# Patient Record
Sex: Female | Born: 1970
Health system: Southern US, Community
[De-identification: ages and names within clinical notes are randomized; demographics above are authoritative.]

## PROBLEM LIST (undated history)

## (undated) DIAGNOSIS — IMO0001 Reserved for inherently not codable concepts without codable children: Secondary | ICD-10-CM

## (undated) DIAGNOSIS — F329 Major depressive disorder, single episode, unspecified: Secondary | ICD-10-CM

## (undated) DIAGNOSIS — I2699 Other pulmonary embolism without acute cor pulmonale: Secondary | ICD-10-CM

## (undated) DIAGNOSIS — K219 Gastro-esophageal reflux disease without esophagitis: Secondary | ICD-10-CM

## (undated) DIAGNOSIS — N938 Other specified abnormal uterine and vaginal bleeding: Secondary | ICD-10-CM

## (undated) DIAGNOSIS — F32A Depression, unspecified: Secondary | ICD-10-CM

## (undated) DIAGNOSIS — Z86711 Personal history of pulmonary embolism: Secondary | ICD-10-CM

## (undated) DIAGNOSIS — K118 Other diseases of salivary glands: Secondary | ICD-10-CM

## (undated) DIAGNOSIS — I5189 Other ill-defined heart diseases: Secondary | ICD-10-CM

## (undated) DIAGNOSIS — I82629 Acute embolism and thrombosis of deep veins of unspecified upper extremity: Secondary | ICD-10-CM

## (undated) DIAGNOSIS — F419 Anxiety disorder, unspecified: Secondary | ICD-10-CM

## (undated) DIAGNOSIS — D509 Iron deficiency anemia, unspecified: Secondary | ICD-10-CM

## (undated) HISTORY — PX: PAROTID GLAND TUMOR EXCISION: SHX5221

## (undated) HISTORY — DX: Personal history of pulmonary embolism: Z86.711

## (undated) HISTORY — DX: Depression, unspecified: F32.A

## (undated) HISTORY — DX: Gastro-esophageal reflux disease without esophagitis: K21.9

## (undated) HISTORY — PX: ABDOMINAL HYSTERECTOMY: SHX81

## (undated) HISTORY — DX: Major depressive disorder, single episode, unspecified: F32.9

## (undated) HISTORY — PX: WISDOM TOOTH EXTRACTION: SHX21

---

## 2003-02-28 ENCOUNTER — Ambulatory Visit (HOSPITAL_COMMUNITY): Admission: RE | Admit: 2003-02-28 | Discharge: 2003-02-28 | Payer: Self-pay | Admitting: Urology

## 2003-03-07 ENCOUNTER — Ambulatory Visit (HOSPITAL_COMMUNITY): Admission: RE | Admit: 2003-03-07 | Discharge: 2003-03-07 | Payer: Self-pay | Admitting: Urology

## 2006-11-18 ENCOUNTER — Ambulatory Visit (HOSPITAL_COMMUNITY): Admission: RE | Admit: 2006-11-18 | Discharge: 2006-11-18 | Payer: Self-pay | Admitting: Family Medicine

## 2007-04-03 ENCOUNTER — Ambulatory Visit (HOSPITAL_COMMUNITY): Admission: RE | Admit: 2007-04-03 | Discharge: 2007-04-03 | Payer: Self-pay | Admitting: Family Medicine

## 2007-11-26 ENCOUNTER — Ambulatory Visit (HOSPITAL_COMMUNITY): Admission: RE | Admit: 2007-11-26 | Discharge: 2007-11-26 | Payer: Self-pay | Admitting: Cardiovascular Disease

## 2008-04-27 ENCOUNTER — Ambulatory Visit (HOSPITAL_COMMUNITY): Admission: RE | Admit: 2008-04-27 | Discharge: 2008-04-27 | Payer: Self-pay | Admitting: Family Medicine

## 2013-01-10 ENCOUNTER — Encounter (HOSPITAL_COMMUNITY): Payer: Self-pay | Admitting: Emergency Medicine

## 2013-01-10 DIAGNOSIS — Y929 Unspecified place or not applicable: Secondary | ICD-10-CM | POA: Insufficient documentation

## 2013-01-10 DIAGNOSIS — X503XXA Overexertion from repetitive movements, initial encounter: Secondary | ICD-10-CM | POA: Insufficient documentation

## 2013-01-10 DIAGNOSIS — Y9389 Activity, other specified: Secondary | ICD-10-CM | POA: Insufficient documentation

## 2013-01-10 DIAGNOSIS — S0993XA Unspecified injury of face, initial encounter: Secondary | ICD-10-CM | POA: Insufficient documentation

## 2013-01-10 DIAGNOSIS — R209 Unspecified disturbances of skin sensation: Secondary | ICD-10-CM | POA: Insufficient documentation

## 2013-01-10 DIAGNOSIS — X500XXA Overexertion from strenuous movement or load, initial encounter: Secondary | ICD-10-CM | POA: Insufficient documentation

## 2013-01-10 DIAGNOSIS — K029 Dental caries, unspecified: Secondary | ICD-10-CM | POA: Insufficient documentation

## 2013-01-10 DIAGNOSIS — S199XXA Unspecified injury of neck, initial encounter: Principal | ICD-10-CM

## 2013-01-10 DIAGNOSIS — K089 Disorder of teeth and supporting structures, unspecified: Secondary | ICD-10-CM | POA: Insufficient documentation

## 2013-01-10 NOTE — ED Notes (Signed)
Pain in neck for 2 weeks. Numbness of lt side of face since 930pm.  Both arms numb when lies down.  Normal movement of ext.

## 2013-01-11 ENCOUNTER — Emergency Department (HOSPITAL_COMMUNITY)
Admission: EM | Admit: 2013-01-11 | Discharge: 2013-01-11 | Disposition: A | Discharge status: Home / Self Care | Payer: Medicaid Other | Attending: Emergency Medicine | Admitting: Emergency Medicine

## 2013-01-11 DIAGNOSIS — M542 Cervicalgia: Secondary | ICD-10-CM

## 2013-01-11 DIAGNOSIS — K089 Disorder of teeth and supporting structures, unspecified: Secondary | ICD-10-CM

## 2013-01-11 MED ORDER — NAPROXEN 500 MG PO TABS: 500.0000 mg | ORAL_TABLET | Freq: Two times a day (BID) | ORAL | Status: DC

## 2013-01-11 MED ORDER — PREDNISONE 20 MG PO TABS: ORAL_TABLET | ORAL | Status: DC

## 2013-01-11 MED ORDER — AMOXICILLIN 500 MG PO CAPS: 500.0000 mg | ORAL_CAPSULE | Freq: Three times a day (TID) | ORAL | Status: DC

## 2013-01-11 MED ORDER — PREDNISONE 20 MG PO TABS
40.0000 mg | ORAL_TABLET | Freq: Once | ORAL | Status: AC
  Administered 2013-01-11: 40 mg via ORAL
  Filled 2013-01-11: qty 2

## 2013-01-11 MED ORDER — CLINDAMYCIN HCL 300 MG PO CAPS: 300.0000 mg | ORAL_CAPSULE | Freq: Four times a day (QID) | ORAL | Status: DC

## 2013-01-11 MED ORDER — AMOXICILLIN 250 MG PO CAPS
1000.0000 mg | ORAL_CAPSULE | Freq: Once | ORAL | Status: DC
  Filled 2013-01-11: qty 4

## 2013-01-11 MED ORDER — CYCLOBENZAPRINE HCL 10 MG PO TABS: 10.0000 mg | ORAL_TABLET | Freq: Two times a day (BID) | ORAL | Status: DC | PRN

## 2013-01-11 MED ORDER — KETOROLAC TROMETHAMINE 60 MG/2ML IM SOLN
60.0000 mg | Freq: Once | INTRAMUSCULAR | Status: AC
  Administered 2013-01-11: 60 mg via INTRAMUSCULAR
  Filled 2013-01-11: qty 2

## 2013-01-11 NOTE — ED Provider Notes (Signed)
CSN: [id number]     Arrival date & time 01/10/13  2248 History   First MD Initiated Contact with Patient 01/11/13 0102     Chief Complaint  Patient presents with  . Neck Pain   (Consider location/radiation/quality/duration/timing/severity/associated sxs/prior Treatment) HPI.... neck pain for 2 weeks secondary to lifting baby awkwardly.   Patient claims to have transient arm numbness in the morning. Additionally she has a rotten tooth #12 with associated left cheek numbness.  No fever, chills, meningeal signs. Certain positioning makes neck pain worse. Severity is mild to moderate. No chronic health problems.  History reviewed. No pertinent past medical history. History reviewed. No pertinent past surgical history. History reviewed. No pertinent family history. History  Substance Use Topics  . Smoking status: Never Smoker   . Smokeless tobacco: Not on file  . Alcohol Use: No   OB History   Grav Para Term Preterm Abortions TAB SAB Ect Mult Living                 Review of Systems  All other systems reviewed and are negative.    Allergies  Review of patient's allergies indicates no known allergies.  Home Medications   Current Outpatient Rx  Name  Route  Sig  Dispense  Refill  . amoxicillin (AMOXIL) 500 MG capsule   Oral   Take 1 capsule (500 mg total) by mouth 3 (three) times daily.   30 capsule   0   . cyclobenzaprine (FLEXERIL) 10 MG tablet   Oral   Take 1 tablet (10 mg total) by mouth 2 (two) times daily as needed for muscle spasms.   20 tablet   0   . naproxen (NAPROSYN) 500 MG tablet   Oral   Take 1 tablet (500 mg total) by mouth 2 (two) times daily.   20 tablet   0   . predniSONE (DELTASONE) 20 MG tablet      3 tabs po day one, then 2 po daily x 4 days   11 tablet   0    BP 123/57  Pulse 96  Temp(Src) 99 F (37.2 C) (Oral)  Resp 20  Ht 5\' 5"  (1.651 m)  Wt 135 lb (61.236 kg)  BMI 22.47 kg/m2  SpO2 100%  LMP 01/10/2013 Physical Exam  Nursing  note and vitals reviewed. Constitutional: She is oriented to person, place, and time. She appears well-developed and well-nourished.  HENT:  Head: Normocephalic and atraumatic.  Tooth #12 is rotten  Eyes: Conjunctivae and EOM are normal. Pupils are equal, round, and reactive to light.  Neck: Normal range of motion. Neck supple.  Minimal generalized cervical tenderness, but no meningeal signs  Cardiovascular: Normal rate, regular rhythm and normal heart sounds.   Pulmonary/Chest: Effort normal and breath sounds normal.  Abdominal: Soft. Bowel sounds are normal.  Musculoskeletal: Normal range of motion.  Neurological: She is alert and oriented to person, place, and time.  Skin: Skin is warm and dry.  Psychiatric: She has a normal mood and affect. Her behavior is normal.    ED Course  Procedures (including critical care time) Labs Review Labs Reviewed - No data to display Imaging Review No results found.  EKG Interpretation   None       MDM   1. Neck pain   2. Tooth disease    Patient is in no acute distress. No evidence of stroke or MI. Rx amoxicillin 500 mg, prednisone, Naprosyn 500 mg, Flexeril 10 mg. Resource guide given.  Nat Christen, MD 01/11/13 [phone]

## 2013-01-11 NOTE — Discharge Instructions (Signed)
Prescriptions for antibiotic for tooth. Also prescriptions for medication for pain, inflammation, muscle spasm. Resource guide given for healthcare followup.  You will ultimately need to pull the tooth.    Emergency Department Resource Guide 1) Find a Doctor and Pay Out of Pocket Although you won't have to find out who is covered by your insurance plan, it is a good idea to ask around and get recommendations. You will then need to call the office and see if the doctor you have chosen will accept you as a new patient and what types of options they offer for patients who are self-pay. Some doctors offer discounts or will set up payment plans for their patients who do not have insurance, but you will need to ask so you aren't surprised when you get to your appointment.  2) Contact Your Local Health Department Not all health departments have doctors that can see patients for sick visits, but many do, so it is worth a call to see if yours does. If you don't know where your local health department is, you can check in your phone book. The CDC also has a tool to help you locate your state's health department, and many state websites also have listings of all of their local health departments.  3) Find a [facility] If your illness is not likely to be very severe or complicated, you may want to try a walk in clinic. These are popping up all over the country in pharmacies, drugstores, and shopping centers. They're usually staffed by nurse practitioners or physician assistants that have been trained to treat common illnesses and complaints. They're usually fairly quick and inexpensive. However, if you have serious medical issues or chronic medical problems, these are probably not your best option.  No Primary Care Doctor: - Call Health Connect at  [phone] - they can help you locate a primary care doctor that  accepts your insurance, provides certain services, etc. - Physician Referral Service-  [phone]  Chronic Pain Problems: Organization         Address  Phone   Notes  [facility]  [phone] Patients need to be referred by their primary care doctor.   Medication Assistance: Organization         Address  Phone   Notes  [facility] Medication [facility] [address] [place], [place] [address] [phone] --Must be a resident of [facility] -- Must have NO insurance coverage whatsoever (no Medicaid/ Medicare, etc.) -- The pt. MUST have a primary care doctor that directs their care regularly and follows them in the community   MedAssist  [phone]   [employer]  [phone]    Agencies that provide inexpensive medical care: Organization         Address  Phone   Notes  [place]  [phone]   [facility]    [phone]   [facility] [place] [place], [place] [address] [phone]   [place] [address] [place] [phone]   Planned Parenthood    [phone]   [facility]    [phone]   [place] and [place] [address], [place] Phone:  [phone], Fax:  [phone] Hours of Operation:  9 am - 6 pm, M-F.  Also accepts Medicaid/Medicare and self-pay.  [facility] for [place]  [address], [address], [place] Phone: [phone], Fax: [phone]. Hours of Operation:  8:30 am - 5:30 pm, M-F.  Also accepts Medicaid and self-pay.  [facility] [address] [place] Phone: [phone]   [place], [place], [place] [phone], Ext. [phone] Mondays & Thursdays: 7-9 AM.  First 15 patients are seen on a first come, first serve basis.    [place] Providers:  Organization         Address  Phone   Notes  [facility] [address],  [place] [phone] Also accepts self-pay patients.  [facility] [address], [place]  [phone]   [place], [address], [place] [phone]   [facility] [address] [place] [phone]   Lucianne Lei [address], [place]   [phone] Only accepts [place] Access [place] patients after they have their name applied to their card.   Self-Pay (no insurance) in [facility]:  Organization         Address  Phone   Notes  Sickle Cell Patients, [facility] [place] [phone]   [facility] Urgent Care [place] [phone]   [facility] [place]  [place], [place], [place] [phone]   [facility]/Dr. Osei-Bonsu  [address] [place] or [address], [place] [phone] Phone number for both [place] and [place] locations is the same.  Urgent Medical and [facility] [address], [place] [phone]   [facility] [address] [place] or [address] [phone]   [facility] [address] [place] [phone], phone; [phone], fax Sees patients 1st and 3rd Saturday of every month.  Must not qualify for public or private insurance (i.e. Medicaid, Medicare, [place] Health Choice, Veterans' Benefits)  Household income should be no more than 200% of the poverty level The clinic cannot treat you if you are pregnant or think you are pregnant  Sexually transmitted diseases are not treated at the clinic.    Dental Care: Organization         Address  Phone  Notes  [facility] Department of [facility] [place] [phone] Accepts children up to age 11 who are enrolled in  [place] or [place]; pregnant women with a Medicaid card; and children who have applied for Medicaid or [place] Health Choice, but were declined, whose parents can pay a reduced fee at time of service.  [facility] Department of [facility]  [address], [place] [phone] Accepts children up to age 74 who are enrolled in [place] or [place]; pregnant women with a Medicaid card; and children who have applied for Medicaid or [place] Health Choice, but were declined, whose parents can pay a reduced fee at time of service.  [place] Adult [facility] Access PROGRAM  [place] [phone] Patients are seen by appointment only. Walk-ins are not accepted. [place] will see patients 38 years of age and older. Monday - Tuesday (8am-5pm) Most Wednesdays (8:30-5pm) $30 per visit, cash only  [place]  PROGRAM  [address], [facility] [phone] Patients are seen by appointment only. Walk-ins are not accepted. [place] will see patients 72 years of age and older. One Wednesday Evening (Monthly: Volunteer Based).  $30 per visit, cash only  [place]  [phone] for adults; Children under age 63, call Graduate Pediatric Dentistry at [phone]. Children aged 75-14, please call [phone] to request a pediatric application.  Dental services are provided in all areas of dental care including fillings, crowns and bridges, complete and partial dentures, implants, gum treatment, root canals, and extractions. Preventive care is also provided. Treatment is provided to both adults and children. Patients are selected via a lottery and there is often a waiting list.   [facility] [address] [place]  [phone] [email or web address]   [facility] [address], [place] [phone], Ext. [phone] Second and Fourth Thursday of each month, opens at 6:30  AM; Clinic ends at 9 AM.  Patients are seen on a first-come first-served basis, and a limited number are seen during each clinic.   [facility]  [address] Hillard Danker [place], [place] [phone]   Eligibility Requirements You must have lived in [place], [place], or [place] counties for at least the last three months.   You cannot be eligible for state or federal sponsored [employer], including [employer], [place], or [employer].   You generally cannot be eligible for healthcare insurance through your employer.    How to apply: Eligibility screenings are held every Tuesday and Wednesday afternoon from 1:00 pm until 4:00 pm. You do not need an appointment for the interview!  [facility] [address] [place], [place]   [place]  [facility] Department  [place]  [phone]    Behavioral Health Resources in the Community: Intensive Outpatient Programs Organization         Address  Phone  Notes  [place] Warren. [address], [place], [place] [phone]   [facility] Outpatient [address], [place], [place]   ADS: Alcohol & Drug Svcs [address] [place], [place]   [place] [address]  [place], [place] or [phone]   Substance Abuse Resources Organization         Address  Phone  Notes  Alcohol and Drug Services  [phone]   [place]  [phone]   The [place]   Chinita Pester  [phone]   Residential & Outpatient Substance Abuse Program  [phone]   Psychological Services Organization         Address  Phone  Notes  [facility] [place]  [place]  [phone]   [place] [address], [place] or  [phone]    Mobile Crisis Teams Organization         Address  Phone  Notes  [facility], Mobile Crisis Care Unit  [phone]   [facility]otherapeutic Services  [address] [place], [place]   [place] [address], [place] [phone]    Self-Help/Support Groups Organization         Address  Phone             Notes  [place].  of [place] - variety of support groups  [place] Call for more information  Narcotics Anonymous (NA), [facility] [address], [employer] [place]  2 meetings at this location   Special educational needs teacher         Address  Phone  Notes  ASAP Residential Treatment [place],    [place]  [phone]   [facility]  [address] [place] [address], [place], [place]   [place], [place] [phone] Admissions: 8am-3pm M-F  Incentives Substance [place] [address],    [place], [place] [phone]   [facility] [address] [place], [place], [place]   [facility] [address],  [place], [place]   [facility] Programs - Intensive Outpatient [address]een [address], [place], [place]   [facility] [place].,  [place], [place] [phone] or [phone]   [facility] Services (RTS) [address] [place], [place] Accepts Medicaid  [facility] [place] [address]  [place] [phone] Substance Abuse/Addiction Treatment   [facility] Organization         Address  Phone  Notes  [facility]  [phone]   Domenic Schwab, PhD [address] Arlis Porta [place], [place]   [phone] or [phone]   [place], [place] [phone]   [facility] [address],  [place], [place] [phone] Insurance/Medicaid/sponsorship through [facility] and Families [address]                                    [place], [place] [phone] [place] [address], [place] [phone]    Dr. Adele Schilder  [phone]   [facility] Dept. 1) [address], [place] 2) [place] 3)  [place] [address], [place] [phone]  [phone]   [place] [phone] or [phone] (After Hours)

## 2013-01-20 ENCOUNTER — Encounter (HOSPITAL_COMMUNITY): Payer: Self-pay | Admitting: Emergency Medicine

## 2013-01-20 ENCOUNTER — Emergency Department (HOSPITAL_COMMUNITY)
Admission: EM | Admit: 2013-01-20 | Discharge: 2013-01-21 | Disposition: A | Discharge status: Home / Self Care | Payer: Medicaid Other | Attending: Emergency Medicine | Admitting: Emergency Medicine

## 2013-01-20 ENCOUNTER — Emergency Department (HOSPITAL_COMMUNITY): Payer: Medicaid Other

## 2013-01-20 DIAGNOSIS — R5383 Other fatigue: Secondary | ICD-10-CM

## 2013-01-20 DIAGNOSIS — R5381 Other malaise: Secondary | ICD-10-CM | POA: Insufficient documentation

## 2013-01-20 DIAGNOSIS — IMO0002 Reserved for concepts with insufficient information to code with codable children: Secondary | ICD-10-CM | POA: Insufficient documentation

## 2013-01-20 DIAGNOSIS — R209 Unspecified disturbances of skin sensation: Secondary | ICD-10-CM | POA: Insufficient documentation

## 2013-01-20 DIAGNOSIS — R220 Localized swelling, mass and lump, head: Secondary | ICD-10-CM | POA: Insufficient documentation

## 2013-01-20 DIAGNOSIS — R221 Localized swelling, mass and lump, neck: Secondary | ICD-10-CM

## 2013-01-20 DIAGNOSIS — Z791 Long term (current) use of non-steroidal anti-inflammatories (NSAID): Secondary | ICD-10-CM | POA: Insufficient documentation

## 2013-01-20 DIAGNOSIS — J329 Chronic sinusitis, unspecified: Secondary | ICD-10-CM

## 2013-01-20 DIAGNOSIS — M542 Cervicalgia: Secondary | ICD-10-CM | POA: Insufficient documentation

## 2013-01-20 DIAGNOSIS — D649 Anemia, unspecified: Secondary | ICD-10-CM | POA: Insufficient documentation

## 2013-01-20 DIAGNOSIS — Z880 Allergy status to penicillin: Secondary | ICD-10-CM | POA: Insufficient documentation

## 2013-01-20 DIAGNOSIS — R197 Diarrhea, unspecified: Secondary | ICD-10-CM | POA: Insufficient documentation

## 2013-01-20 DIAGNOSIS — F411 Generalized anxiety disorder: Secondary | ICD-10-CM | POA: Insufficient documentation

## 2013-01-20 LAB — CBC WITH DIFFERENTIAL/PLATELET
BASOS ABS: 0 10*3/uL (ref 0.0–0.1)
Basophils Relative: 0 % (ref 0–1)
Eosinophils Absolute: 0.1 10*3/uL (ref 0.0–0.7)
Eosinophils Relative: 1 % (ref 0–5)
HCT: 30.9 % — ABNORMAL LOW (ref 36.0–46.0)
Hemoglobin: 9.8 g/dL — ABNORMAL LOW (ref 12.0–15.0)
LYMPHS ABS: 2.2 10*3/uL (ref 0.7–4.0)
LYMPHS PCT: 23 % (ref 12–46)
MCH: 24.2 pg — ABNORMAL LOW (ref 26.0–34.0)
MCHC: 31.7 g/dL (ref 30.0–36.0)
MCV: 76.3 fL — ABNORMAL LOW (ref 78.0–100.0)
Monocytes Absolute: 0.8 10*3/uL (ref 0.1–1.0)
Monocytes Relative: 9 % (ref 3–12)
NEUTROS ABS: 6.4 10*3/uL (ref 1.7–7.7)
NEUTROS PCT: 67 % (ref 43–77)
PLATELETS: 216 10*3/uL (ref 150–400)
RBC: 4.05 MIL/uL (ref 3.87–5.11)
RDW: 18 % — AB (ref 11.5–15.5)
WBC: 9.5 10*3/uL (ref 4.0–10.5)

## 2013-01-20 LAB — BASIC METABOLIC PANEL
BUN: 3 mg/dL — ABNORMAL LOW (ref 6–23)
CO2: 25 meq/L (ref 19–32)
Calcium: 8.8 mg/dL (ref 8.4–10.5)
Chloride: 105 mEq/L (ref 96–112)
Creatinine, Ser: 0.72 mg/dL (ref 0.50–1.10)
GFR calc Af Amer: >90 mL/min (ref >90)
GFR calc non Af Amer: >90 mL/min (ref >90)
GLUCOSE: 108 mg/dL — AB (ref 70–99)
POTASSIUM: 4.5 meq/L (ref 3.7–5.3)
SODIUM: 142 meq/L (ref 137–147)

## 2013-01-20 MED ORDER — IOHEXOL 300 MG/ML  SOLN
75.0000 mL | Freq: Once | INTRAMUSCULAR | Status: AC | PRN
  Administered 2013-01-20: 75 mL via INTRAVENOUS

## 2013-01-20 MED ORDER — FERROUS SULFATE 325 (65 FE) MG PO TABS: 325.0000 mg | ORAL_TABLET | Freq: Every day | ORAL | Status: DC

## 2013-01-20 MED ORDER — SODIUM CHLORIDE 0.9 % IV SOLN
Freq: Once | INTRAVENOUS | Status: AC
  Administered 2013-01-20: 22:00:00 via INTRAVENOUS

## 2013-01-20 MED ORDER — PREDNISONE 50 MG PO TABS: ORAL_TABLET | ORAL | Status: DC

## 2013-01-20 MED ORDER — SODIUM CHLORIDE 0.9 % IV SOLN
Freq: Once | INTRAVENOUS | Status: AC
  Administered 2013-01-20: 23:00:00 via INTRAVENOUS

## 2013-01-20 MED ORDER — SODIUM CHLORIDE 0.9 % IV SOLN: Freq: Once | INTRAVENOUS | Status: DC

## 2013-01-20 MED ORDER — TRAMADOL HCL 50 MG PO TABS: 50.0000 mg | ORAL_TABLET | Freq: Four times a day (QID) | ORAL | Status: DC | PRN

## 2013-01-20 NOTE — Discharge Instructions (Signed)
Iron Deficiency Anemia, Adult Anemia is when you have a low number of healthy red blood cells. It is often caused by too little iron. This is called iron deficiency anemia. It may make you tired and short of breath. HOME CARE   Take iron as told by your doctor.  Take vitamins as told by your doctor.  Eat foods that have iron in them. This includes liver, lean beef, whole-grain bread, eggs, dried fruit, and dark green leafy vegetables. GET HELP RIGHT AWAY IF:  You pass out (faint).  You have chest pain.  You feel sick to your stomach (nauseous) or throw up (vomit).  You get very short of breath with activity.  You are weak.  You have a fast heartbeat.  You start to sweat for no reason.  You become lightheaded when getting up from a chair or bed. MAKE SURE YOU:  Understand these instructions.  Will watch your condition.  Will get help right away if you are not doing well or get worse. Document Released: 01/25/2010 Document Revised: 10/13/2012 Document Reviewed: 08/30/2012 [facility] Patient Information 2014 [place].

## 2013-01-20 NOTE — ED Notes (Signed)
Pt says lt side of face is numb and feels swollen.  Seen here  For same.  Had  2 upper lt teeth removed 1 week ago and 2 mandibular wisdom teeth removed on Tuesday  1/13.   Pt is having diarrhea , she thinks from the  Clindamycin and has stopped that.  Pt says she feels weak , tearful. Concerned that she may have a brain tumor causing her numbness.

## 2013-01-20 NOTE — ED Notes (Signed)
Facial pain and had teeth pulled. Feeling weak, was put on an antibiotic and was told to stop taking it since I was having watery diarrhea per pt.

## 2013-01-21 ENCOUNTER — Emergency Department (HOSPITAL_COMMUNITY)
Admission: EM | Admit: 2013-01-21 | Discharge: 2013-01-21 | Disposition: A | Discharge status: Home / Self Care | Payer: Medicaid Other | Attending: Emergency Medicine | Admitting: Emergency Medicine

## 2013-01-21 ENCOUNTER — Encounter (HOSPITAL_COMMUNITY): Payer: Self-pay | Admitting: Emergency Medicine

## 2013-01-21 ENCOUNTER — Emergency Department (HOSPITAL_COMMUNITY): Payer: Medicaid Other

## 2013-01-21 DIAGNOSIS — IMO0002 Reserved for concepts with insufficient information to code with codable children: Secondary | ICD-10-CM | POA: Insufficient documentation

## 2013-01-21 DIAGNOSIS — R5383 Other fatigue: Secondary | ICD-10-CM

## 2013-01-21 DIAGNOSIS — R42 Dizziness and giddiness: Secondary | ICD-10-CM | POA: Insufficient documentation

## 2013-01-21 DIAGNOSIS — Z791 Long term (current) use of non-steroidal anti-inflammatories (NSAID): Secondary | ICD-10-CM | POA: Insufficient documentation

## 2013-01-21 DIAGNOSIS — R55 Syncope and collapse: Secondary | ICD-10-CM

## 2013-01-21 DIAGNOSIS — R209 Unspecified disturbances of skin sensation: Secondary | ICD-10-CM | POA: Insufficient documentation

## 2013-01-21 DIAGNOSIS — Z3202 Encounter for pregnancy test, result negative: Secondary | ICD-10-CM | POA: Insufficient documentation

## 2013-01-21 DIAGNOSIS — Z79899 Other long term (current) drug therapy: Secondary | ICD-10-CM | POA: Insufficient documentation

## 2013-01-21 DIAGNOSIS — Z880 Allergy status to penicillin: Secondary | ICD-10-CM | POA: Insufficient documentation

## 2013-01-21 DIAGNOSIS — J3489 Other specified disorders of nose and nasal sinuses: Secondary | ICD-10-CM | POA: Insufficient documentation

## 2013-01-21 DIAGNOSIS — R5381 Other malaise: Secondary | ICD-10-CM | POA: Insufficient documentation

## 2013-01-21 HISTORY — DX: Reserved for inherently not codable concepts without codable children: IMO0001

## 2013-01-21 LAB — URINALYSIS, ROUTINE W REFLEX MICROSCOPIC
BILIRUBIN URINE: NEGATIVE
Glucose, UA: NEGATIVE mg/dL
KETONES UR: NEGATIVE mg/dL
LEUKOCYTES UA: NEGATIVE
NITRITE: NEGATIVE
PH: 6.5 (ref 5.0–8.0)
PROTEIN: NEGATIVE mg/dL
Specific Gravity, Urine: <1.005 — ABNORMAL LOW (ref 1.005–1.030)
UROBILINOGEN UA: 0.2 mg/dL (ref 0.0–1.0)

## 2013-01-21 LAB — COMPREHENSIVE METABOLIC PANEL
ALK PHOS: 69 U/L (ref 39–117)
ALT: 9 U/L (ref 0–35)
AST: 15 U/L (ref 0–37)
Albumin: 3.9 g/dL (ref 3.5–5.2)
BILIRUBIN TOTAL: 0.2 mg/dL — AB (ref 0.3–1.2)
BUN: 3 mg/dL — AB (ref 6–23)
CO2: 25 meq/L (ref 19–32)
Calcium: 9.5 mg/dL (ref 8.4–10.5)
Chloride: 102 mEq/L (ref 96–112)
Creatinine, Ser: 0.72 mg/dL (ref 0.50–1.10)
GLUCOSE: 90 mg/dL (ref 70–99)
POTASSIUM: 4.2 meq/L (ref 3.7–5.3)
SODIUM: 142 meq/L (ref 137–147)
TOTAL PROTEIN: 7.6 g/dL (ref 6.0–8.3)

## 2013-01-21 LAB — CBC WITH DIFFERENTIAL/PLATELET
Basophils Absolute: 0 10*3/uL (ref 0.0–0.1)
Basophils Relative: 0 % (ref 0–1)
EOS ABS: 0.1 10*3/uL (ref 0.0–0.7)
Eosinophils Relative: 1 % (ref 0–5)
HCT: 34.5 % — ABNORMAL LOW (ref 36.0–46.0)
HEMOGLOBIN: 11 g/dL — AB (ref 12.0–15.0)
LYMPHS ABS: 2.7 10*3/uL (ref 0.7–4.0)
LYMPHS PCT: 28 % (ref 12–46)
MCH: 24.1 pg — AB (ref 26.0–34.0)
MCHC: 31.9 g/dL (ref 30.0–36.0)
MCV: 75.7 fL — AB (ref 78.0–100.0)
MONOS PCT: 8 % (ref 3–12)
Monocytes Absolute: 0.8 10*3/uL (ref 0.1–1.0)
NEUTROS PCT: 63 % (ref 43–77)
Neutro Abs: 6.1 10*3/uL (ref 1.7–7.7)
PLATELETS: 246 10*3/uL (ref 150–400)
RBC: 4.56 MIL/uL (ref 3.87–5.11)
RDW: 18.1 % — ABNORMAL HIGH (ref 11.5–15.5)
WBC: 9.7 10*3/uL (ref 4.0–10.5)

## 2013-01-21 LAB — URINE MICROSCOPIC-ADD ON

## 2013-01-21 LAB — PREGNANCY, URINE: Preg Test, Ur: NEGATIVE

## 2013-01-21 MED ORDER — SODIUM CHLORIDE 0.9 % IV SOLN
1000.0000 mL | Freq: Once | INTRAVENOUS | Status: AC
  Administered 2013-01-21: 1000 mL via INTRAVENOUS

## 2013-01-21 NOTE — ED Notes (Signed)
Pt alert & oriented x4, stable gait. Patient given discharge instructions, paperwork & prescription(s). Patient  instructed to stop at the registration desk to finish any additional paperwork. Patient verbalized understanding. Pt left department w/ no further questions. 

## 2013-01-21 NOTE — ED Provider Notes (Signed)
CSN: [id number]     Arrival date & time 01/21/13  1721 History   First MD Initiated Contact with Patient 01/21/13 1746     Chief Complaint  Patient presents with  . Near Syncope    HPI Pt was seen at 1750. Per pt, c/o gradual onset and persistence of constant generalized fatigue and lightheadedness for the past 1.5 weeks. Pt states her symptoms worsen when she stands up from laying down. Pt states she has been evaluated in the ED x2, as well as by her Dentist and Oral Surgeon x2 in the past 1.5 weeks for same. States her left face continues to "feels numb" and "hurts" since her teeth removal 1 week ago. Denies any other symptoms, no CP/palpitations, no SOB/cough, no abd pain, no N/V/D, no rash, no fevers, no sore throat.    Past Medical History  Diagnosis Date  . Normal cardiac stress test 2009    normal coronary CT scan   Past Surgical History  Procedure Laterality Date  . Wisdom tooth extraction      History  Substance Use Topics  . Smoking status: Never Smoker   . Smokeless tobacco: Not on file  . Alcohol Use: No    Review of Systems ROS: Statement: All systems negative except as marked or noted in the HPI; Constitutional: +generalized fatigue, lightheadedness. Negative for fever and chills. ; ; Eyes: Negative for eye pain, redness and discharge. ; ; ENMT: Negative for ear pain, hoarseness, nasal congestion, sinus pressure and sore throat. ; ; Cardiovascular: Negative for chest pain, palpitations, diaphoresis, dyspnea and peripheral edema. ; ; Respiratory: Negative for cough, wheezing and stridor. ; ; Gastrointestinal: Negative for nausea, vomiting, diarrhea, abdominal pain, blood in stool, hematemesis, jaundice and rectal bleeding. . ; ; Genitourinary: Negative for dysuria, flank pain and hematuria. ; ; Musculoskeletal: Negative for back pain and neck pain. Negative for swelling and trauma.; ; Skin: Negative for pruritus, rash, abrasions, blisters, bruising and skin lesion.; ; Neuro:  Negative for headache and neck stiffness. Negative for altered level of consciousness , altered mental status, extremity weakness, paresthesias, involuntary movement, seizure and syncope.     Allergies  Amoxicillin  Home Medications   Current Outpatient Rx  Name  Route  Sig  Dispense  Refill  . cyclobenzaprine (FLEXERIL) 10 MG tablet   Oral   Take 1 tablet (10 mg total) by mouth 2 (two) times daily as needed for muscle spasms.   20 tablet   0   . ferrous sulfate 325 (65 FE) MG tablet   Oral   Take 1 tablet (325 mg total) by mouth daily.   30 tablet   0   . naproxen (NAPROSYN) 500 MG tablet   Oral   Take 1 tablet (500 mg total) by mouth 2 (two) times daily.   20 tablet   0   . traMADol (ULTRAM) 50 MG tablet   Oral   Take 1 tablet (50 mg total) by mouth every 6 (six) hours as needed.   10 tablet   0   . predniSONE (DELTASONE) 50 MG tablet      One tablet po qd x 3 days   3 tablet   0    BP 117/78  Pulse 94  Temp(Src) 98.7 F (37.1 C) (Oral)  Resp 18  Wt 135 lb (61.236 kg)  SpO2 100%  LMP 01/10/2013 Physical Exam 1755: Physical examination:  Nursing notes reviewed; Vital signs and O2 SAT reviewed;  Constitutional: Well developed, Well  nourished, Well hydrated, In no acute distress; Head:  Normocephalic, atraumatic. No facial edema, erythema, ecchymosis..; Eyes: EOMI, PERRL, No scleral icterus; ENMT: TM's clear bilat. +edemetous nasal turbinates bilat with clear rhinorrhea. Healing teeth extraction sites without bleeding, drainage. Mouth and pharynx without lesions. No tonsillar exudates. No intra-oral edema. No submandibular or sublingual edema. No hoarse voice, no drooling, no stridor. No pain with manipulation of larynx.  Mouth and pharynx normal, Mucous membranes moist; Neck: Supple, Full range of motion, No lymphadenopathy. No meningeal signs.; Cardiovascular: Regular rate and rhythm, No murmur, rub, or gallop; Respiratory: Breath sounds clear & equal bilaterally,  No rales, rhonchi, wheezes.  Speaking full sentences with ease, Normal respiratory effort/excursion; Chest: Nontender, Movement normal; Abdomen: Soft, Nontender, Nondistended, Normal bowel sounds; Genitourinary: No CVA tenderness; Spine:  No midline CS, TS, LS tenderness. +TTP left hypertonic trapezius muscle.;; Extremities: Pulses normal, No tenderness, No edema, No calf edema or asymmetry.; Neuro: AA&Ox3, Major CN grossly intact. No facial droop. Speech clear. No gross focal motor or sensory deficits in extremities. Climbs on and off stretcher easily by herself. Gait steady.; Skin: Color normal, Warm, Dry.; Psych:  Anxious, tearful.    ED Course  Procedures     EKG Interpretation   None       MDM  MDM Reviewed: previous chart, nursing note and vitals Reviewed previous: CT scan and labs Interpretation: labs, CT scan and x-ray   Results for orders placed during the hospital encounter of 01/21/13  CBC WITH DIFFERENTIAL      Result Value Range   WBC 9.7  4.0 - 10.5 K/uL   RBC 4.56  3.87 - 5.11 MIL/uL   Hemoglobin 11.0 (*) 12.0 - 15.0 g/dL   HCT 34.5 (*) 36.0 - 46.0 %   MCV 75.7 (*) 78.0 - 100.0 fL   MCH 24.1 (*) 26.0 - 34.0 pg   MCHC 31.9  30.0 - 36.0 g/dL   RDW 18.1 (*) 11.5 - 15.5 %   Platelets 246  150 - 400 K/uL   Neutrophils Relative % 63  43 - 77 %   Neutro Abs 6.1  1.7 - 7.7 K/uL   Lymphocytes Relative 28  12 - 46 %   Lymphs Abs 2.7  0.7 - 4.0 K/uL   Monocytes Relative 8  3 - 12 %   Monocytes Absolute 0.8  0.1 - 1.0 K/uL   Eosinophils Relative 1  0 - 5 %   Eosinophils Absolute 0.1  0.0 - 0.7 K/uL   Basophils Relative 0  0 - 1 %   Basophils Absolute 0.0  0.0 - 0.1 K/uL  COMPREHENSIVE METABOLIC PANEL      Result Value Range   Sodium 142  137 - 147 mEq/L   Potassium 4.2  3.7 - 5.3 mEq/L   Chloride 102  96 - 112 mEq/L   CO2 25  19 - 32 mEq/L   Glucose, Bld 90  70 - 99 mg/dL   BUN 3 (*) 6 - 23 mg/dL   Creatinine, Ser 0.72  0.50 - 1.10 mg/dL   Calcium 9.5  8.4 -  10.5 mg/dL   Total Protein 7.6  6.0 - 8.3 g/dL   Albumin 3.9  3.5 - 5.2 g/dL   AST 15  0 - 37 U/L   ALT 9  0 - 35 U/L   Alkaline Phosphatase 69  39 - 117 U/L   Total Bilirubin 0.2 (*) 0.3 - 1.2 mg/dL   GFR calc non Af Amer >90  >  90 mL/min   GFR calc Af Amer >90  >90 mL/min  URINALYSIS, ROUTINE W REFLEX MICROSCOPIC      Result Value Range   Color, Urine YELLOW  YELLOW   APPearance CLEAR  CLEAR   Specific Gravity, Urine <1.005 (*) 1.005 - 1.030   pH 6.5  5.0 - 8.0   Glucose, UA NEGATIVE  NEGATIVE mg/dL   Hgb urine dipstick TRACE (*) NEGATIVE   Bilirubin Urine NEGATIVE  NEGATIVE   Ketones, ur NEGATIVE  NEGATIVE mg/dL   Protein, ur NEGATIVE  NEGATIVE mg/dL   Urobilinogen, UA 0.2  0.0 - 1.0 mg/dL   Nitrite NEGATIVE  NEGATIVE   Leukocytes, UA NEGATIVE  NEGATIVE  PREGNANCY, URINE      Result Value Range   Preg Test, Ur NEGATIVE  NEGATIVE  URINE MICROSCOPIC-ADD ON      Result Value Range   WBC, UA 0-2  <3 WBC/hpf   RBC / HPF 0-2  <3 RBC/hpf   Dg Chest 2 View 01/21/2013   CLINICAL DATA:  Dizziness and weakness, recent Oral surgery and treatment with antibiotics with subsequent nausea and diarrhea and discontinuation of the antibiotics  EXAM: CHEST  2 VIEW  COMPARISON:  PA and lateral chest x-ray of April 27, 2008  FINDINGS: The lungs are well-expanded and clear. The cardiopericardial silhouette is normal in size. The pulmonary vascularity is not engorged. The mediastinum is normal in width. There is no pleural effusion or pneumothorax. The observed portions of the bony thorax appear normal.  IMPRESSION: There is no evidence of pneumonia or other acute cardiopulmonary abnormality.   Electronically Signed   By: David  Martinique   On: 01/21/2013 19:35   Ct Head Wo Contrast 01/21/2013   CLINICAL DATA:  Syncopal episode  EXAM: CT HEAD WITHOUT CONTRAST  TECHNIQUE: Contiguous axial images were obtained from the base of the skull through the vertex without intravenous contrast.  COMPARISON:  Neck CT  scan of January 20, 2013.  FINDINGS: The ventricles are normal in size and position. There is no intracranial hemorrhage nor intracranial mass effect. There is no evidence of an evolving ischemic infarction. The cerebellum and brainstem are normal in density.  At bone window settings the observed portions of the paranasal sinuses and mastoid air cells are clear. There is no evidence of a skull fracture or lytic or blastic skull lesion.  IMPRESSION: 1. There is no acute intracranial abnormality. 2. The observed portions of the paranasal sinuses are clear.   Electronically Signed   By: David  Martinique   On: 01/21/2013 19:50   Ct Soft Tissue Neck W Contrast 01/20/2013   CLINICAL DATA:  Dental pain.  Facial pain.  EXAM: CT NECK WITH CONTRAST  TECHNIQUE: Multidetector CT imaging of the neck was performed using the standard protocol following the bolus administration of intravenous contrast.  CONTRAST:  22mL OMNIPAQUE IOHEXOL 300 MG/ML  SOLN  COMPARISON:  None.  FINDINGS: Empty sockets of the left upper first and second premolars and the bilateral lower terminal molars. There is some gas within the lower right molar socket, although it is predominated filled with granulation tissue or fluid. No evidence of complicating fracture. No significant surrounding soft tissue changes to suggest subperiosteal abscess or odontogenic soft tissue infection. The second premolar socket on the left does extend towards the left maxillary sinus, which has focal mucosal thickening in the floor. There is no definite antral oral fistula at this time, although the patient is at risk for this outcome.  Symmetric and normal appearing salivary glands. Clear apical lungs. Patent major vessels in the neck. No acute osseous findings.  IMPRESSION: 1. Recent bilateral mandibular and left maxillary dental extractions. No evidence of complicating fracture or soft tissue infection. 2. Odontogenic chronic sinusitis in the left maxillary antrum. Empty left  premolar sockets extend into the lower maxillary sinus, without oral antral fistula at this time.   Electronically Signed   By: Jorje Guild M.D.   On: 01/20/2013 23:25    Results for PENI, RUPARD (MRN [id number]) as of 01/21/2013 20:57  Ref. Range 01/20/2013 21:43 01/21/2013 17:49  Hemoglobin Latest Range: 12.0-15.0 g/dL 9.8 (L) 11.0 (L)  HCT Latest Range: 36.0-46.0 % 30.9 (L) 34.5 (L)     2045:  Pt anxious and tearful during eval. Has multiple concerns regarding brain tumor, stroke, overwhelming infection (etc) as cause for her symptoms, requesting recheck of her "low blood" since last night's labs and need for PRBC transfusion today. Pt not orthostatic. Has ambulated with steady upright gait, easy resps. Long d/w pt and spouse regarding reassuring testing results from last night and today, as well as no indication for PRBC transfusion at this time. Pt states she feels better and wants to go home now. Dx and testing d/w pt and family.  Questions answered.  Verb understanding, agreeable to d/c home with outpt f/u.       Alfonzo Feller, DO 01/24/13 1153

## 2013-01-21 NOTE — Discharge Instructions (Signed)
°Emergency Department Resource Guide °1) Find a Doctor and Pay Out of Pocket °Although you won't have to find out who is covered by your insurance plan, it is a good idea to ask around and get recommendations. You will then need to call the office and see if the doctor you have chosen will accept you as a new patient and what types of options they offer for patients who are self-pay. Some doctors offer discounts or will set up payment plans for their patients who do not have insurance, but you will need to ask so you aren't surprised when you get to your appointment. ° °2) Contact Your Local Health Department °Not all health departments have doctors that can see patients for sick visits, but many do, so it is worth a call to see if yours does. If you don't know where your local health department is, you can check in your phone book. The CDC also has a tool to help you locate your state's health department, and many state websites also have listings of all of their local health departments. ° °3) Find a Walk-in Clinic °If your illness is not likely to be very severe or complicated, you may want to try a walk in clinic. These are popping up all over the country in pharmacies, drugstores, and shopping centers. They're usually staffed by nurse practitioners or physician assistants that have been trained to treat common illnesses and complaints. They're usually fairly quick and inexpensive. However, if you have serious medical issues or chronic medical problems, these are probably not your best option. ° °No Primary Care Doctor: °- Call [facility] at  832-8000 - they can help you locate a primary care doctor that  accepts your insurance, provides certain services, etc. °- Physician Referral Service- 1-800-533-3463 ° °Chronic Pain Problems: °Organization         Address  Phone   Notes  °[facility]  ([phone]) [phone] Patients need to be referred by their primary care doctor.  ° °Medication  Assistance: °Organization         Address  Phone   Notes  °[place] [facility] [address] °[place], [place] [address] (336) 641-8030 --Must be a resident of [place] °-- Must have NO insurance coverage whatsoever (no Medicaid/ Medicare, etc.) °-- The pt. MUST have a primary care doctor that directs their care regularly and follows them in the community °  °[facility]  (866) 331-1348   °[employer]  (888) 892-1162   ° °Agencies that provide inexpensive medical care: °Organization         Address  Phone   Notes  °[facility]  (336) 832-8035   °[facility]    (336) 832-7272   °[facility] [address] °[place], [place] [address] (336) 832-4777   °[facility] [address], °[place] (336) 271-4999   °[facility]    (336) 373-0678   °[facility]    (336) 272-1050   °[facility] ° [address], [place] Phone:  (336) 832-4444, Fax:  (336) 832-4440 Hours of Operation:  9 am - 6 pm, M-F.  Also accepts Medicaid/Medicare and self-pay.  °[facility] ° [address], [place] Phone: (336) [phone], Fax: (336) [phone]. Hours of Operation:  8:30 am - 5:30 pm, M-F.  Also accepts Medicaid and self-pay.  °[facility] [place] [address], [place] Phone: (336) 878-6027   °[facility] [address], [place], [place] (336)723-1848, Ext. 123 Mondays & Thursdays: 7-9 AM.  First 15 patients are seen on a first come, first serve basis. °  ° °Medicaid-accepting [place] Providers: ° °Organization         Address  Phone   Notes  °[facility] [address], [place] (336) 641-2100 Also accepts self-pay patients.  °[facility] [address], [place] ° (336) 856-9996   °[facility] [address], [place]  (336) 288-8857   °[facility] [address] [place] [address], [place] (336) 299-7000   °Veita Bland [address], [place]  ° (336) 373-1557 Only accepts [place] Access Medicaid patients after they have their name applied to their card.  ° °Self-Pay (no insurance) in [place]: ° °Organization         Address  Phone   Notes  °Sickle Cell Patients, [facility] [address], [place] (336) 832-1970   °[facility] [address], [place] (336) 832-4400   ° [facility] ° [address], [place] (336) 992-4800   °[facility]/Dr. Osei-Bonsu ° [address] [place] [address], [place] or [address], [place] (336) [phone] Phone number for both [place] and [place] locations is the same.  °[facility] [address], [place] (336) 299-0000   °[facility] [address] [place] [address], [place] or [address] (336) 852-7530 °(336) 878-2260   °[facility] [address], [place] (336) 350-1642, phone; (336) 294-5005, fax Sees patients 1st and 3rd Saturday of every month.  Must not qualify for public or private insurance (i.e. Medicaid, Medicare, [place] Health Choice, Veterans' Benefits) • Household income should be no more than 200% of the poverty level •The clinic cannot treat you if you are pregnant or think you are pregnant • Sexually transmitted diseases are not treated at the clinic.  ° ° °Dental Care: °Organization         Address  Phone  Notes  °[place] [facility] [address], [place] (336) 641-6152 Accepts children up to age 21 who are enrolled in Medicaid or [place] Health Choice; pregnant women with a Medicaid card; and children who have applied for Medicaid or [place] Health Choice, but were declined, whose parents can pay a reduced fee at time of service.  °[place]  [facility] [place]  [address], [place] (336) 641-7733 Accepts children up to age 21 who are enrolled in Medicaid or [place] Health Choice; pregnant women with a Medicaid card; and children who have applied for Medicaid or [place] Health Choice, but were declined, whose parents can pay a reduced fee at time of service.  °[facility] ° [address],  (336) 641-4533 Patients are seen by appointment only. Walk-ins are not accepted. [facility] will see patients 18 years of age and older. °Monday - Tuesday (8am-5pm) °Most Wednesdays (8:30-5pm) °$30 per visit, cash only  °[facility] ° [address], [place] (336) 641-4533 Patients are seen by appointment only. Walk-ins are not accepted. [facility] will see patients 18 years of age and older. °One   Wednesday Evening (Monthly: Volunteer Based).  $30 per visit, cash only  °[facility]  (919) 537-3737 for adults; Children under age 4, call Graduate Pediatric Dentistry at ([phone]) [phone]. Children aged 4-14, please call (919) [phone] to request a pediatric application. ° Dental services are provided in all areas of dental care including fillings, crowns and bridges, complete and partial dentures, implants, gum treatment, root canals, and extractions. Preventive care is also provided. Treatment is provided to both adults and children. °Patients are selected via a lottery and there is often a waiting list. °  °[facility] [address], °[place] ° (336) 763-8833 [email or web address] °  °[facility] [address], [place], [place] (336)723-1848, Ext. 123 Second and Fourth Thursday of each month, opens at 6:30 AM; Clinic ends at 9 AM.  Patients are seen on a first-come first-served basis, and a limited number are seen during each clinic.  ° °[facility] ° [address], [place], [place] (336) [phone]    Eligibility Requirements °You must have lived in [place], or [place] counties for at least the last three months. °  You cannot be eligible for state or federal sponsored healthcare insurance, including Veterans Administration, Medicaid, or Medicare. °  You generally cannot be eligible for healthcare insurance through your employer.  °  How to apply: °Eligibility screenings are held every Tuesday and Wednesday afternoon from 1:00 pm until 4:00 pm. You do not need an appointment for the interview!  °[facility] [address], [place], [place] 336-631-2330   °[facility]  336-342-8273   °[facility]  336-703-3100   °[facility]  336-570-6415   ° °Behavioral Health Resources in the Community: °Intensive Outpatient Programs °Organization         Address  Phone  Notes  °[facility] [address], [place], [place] 336-878-6098   °[facility] [address], [place], [place] 336-832-9800   °[facility] [address], [place], [place] ° 336-882-2125   °[facility] [address],  °[place], [place] 1-800-853-5163 or 336-641-4981   °Substance Abuse Resources °Organization         Address  Phone  Notes  °[facility]  336-882-2125   °[facility]  336-784-9470   °[facility]  336-285-9073   °[facility]  336-845-3988   °Residential & Outpatient Substance Abuse Program  1-800-659-3381   °Psychological Services °Organization         Address  Phone  Notes  °[facility]  336- 832-9600   °[facility]  336- 378-7881   °[facility] [address], [place] 1-800-853-5163 or 336-641-4981   ° °Mobile Crisis Teams °Organization         Address  Phone  Notes  °[facility], Mobile Crisis Care Unit  1-877-626-1772   °Assertive °[facility] ° [address]  [place], [place] 336-834-9664   °Sharon DeEsch [address] °[place] 336-554-5454   ° °Self-Help/Support Groups °Organization         Address  Phone             Notes  °[facility] - variety of support groups  336- 373-1402 Call for more information  °Narcotics Anonymous (NA), [facility] [address], °[place]   2 meetings at this location  ° °  Residential Treatment Programs °Organization         Address  Phone  Notes  °[facility] [address],    °[place]  1-866-801-8205   °[facility] ° [address], [place], [place] 704-293-8524   °[facility] [address], [place] 336-845-3988 Admissions: 8am-3pm M-F  °[facility] [address]    °[place], [place] 336-841-1104   °[facility] [address], [place], [place] 336-379-7146   °[facility] [address]  °[place], [place] 336-285-9073   °[facility] - Intensive Outpatient [address], [place], [place] 336-852-3033   °[facility] [address]  °[place], [place] 1-877-615-2722 or 336-784-9470   °[facility] [address] [place], [place] 336-227-7417 Accepts Medicaid  °[facility] [address]  °[place] 1-800-659-3381 Substance Abuse/Addiction Treatment  ° °[place] Behavioral Health Resources °Organization         Address  Phone  Notes  °[facility]  (888) 581-9988   °Julie Brannon, PhD [address] [place], [place]   (336) 349-5553 or (336) 951-0000   °[facility]   [address] °[place], [place] (336) 349-4454   °[facility] [address], [place], [place] (336) 342-8316 Insurance/Medicaid/sponsorship through [facility]  °[facility] [address]                                    [place], [place] (336) 342-8316 Therapy/tele-psych/case    °[facility] [address]  ° [place], [place] (336) 349-2233    °Dr. Arfeen  (336) 349-4544   °[facility] [place]  [employer] [place] [facility] 1) [address], [place] °2) [address], [place] °3)  [address], [place] (336) 349-3220 °(336) 342-7768 ° °(336) 342-8140   °[place] Child Abuse [facility] (336) 342-1394 or (336) 342-3537 (After Hours)    ° ° °Take your usual prescriptions as previously directed.  Call your regular medical doctor on Monday to schedule a follow up appointment within the next 3 days.  Return to the Emergency Department immediately sooner if worsening.  ° °

## 2013-01-21 NOTE — ED Notes (Signed)
Pt states she feels dizzy when she stands, co neck pain x3 weeks, pt has wisdom teeth removed earlier this week.

## 2013-01-21 NOTE — ED Notes (Signed)
Pt ambulated down the hallway & returned to room.

## 2013-01-22 NOTE — ED Provider Notes (Signed)
CSN: [id number]     Arrival date & time 01/20/13  1954 History   First MD Initiated Contact with Patient 01/20/13 2023     Chief Complaint  Patient presents with  . Dental Pain  . Facial Pain   (Consider location/radiation/quality/duration/timing/severity/associated sxs/prior Treatment) HPI Comments: Victoria Stein is a 43 y.o. female who presents to the Emergency Department with multiple complaints of dental pain, facial swelling,diarrhea, generalized weakness, left sided facial numbness and left neck pain.  Symptoms have been waxing and waning for one week.  She was seen here on 01/11/13 for same and advised that she a "bad tooth".  She followed up with her dentist and had extractions of her #12 tooth and two lower molars.  States she has been taking Clindamycin and also developed diarrhea.  She was advised by her dentist to stop the antibiotic.  She denies fever, chills, visual changes, headache, UE weakness or numbness.  The history is provided by the patient.    Past Medical History  Diagnosis Date  . Normal cardiac stress test 2009    normal coronary CT scan   Past Surgical History  Procedure Laterality Date  . Wisdom tooth extraction     No family history on file. History  Substance Use Topics  . Smoking status: Never Smoker   . Smokeless tobacco: Not on file  . Alcohol Use: No   OB History   Grav Para Term Preterm Abortions TAB SAB Ect Mult Living                 Review of Systems  Constitutional: Negative for fever and appetite change.  HENT: Positive for dental problem. Negative for congestion, facial swelling, sore throat and trouble swallowing.   Eyes: Negative for pain and visual disturbance.  Respiratory: Negative for cough and shortness of breath.   Gastrointestinal: Negative for nausea, vomiting and abdominal pain.  Musculoskeletal: Positive for neck pain. Negative for arthralgias, back pain and neck stiffness.  Skin: Negative for rash.  Neurological: Positive  for numbness. Negative for dizziness, seizures, syncope, facial asymmetry, speech difficulty, weakness, light-headedness and headaches.  Hematological: Negative for adenopathy.  Psychiatric/Behavioral: The patient is nervous/anxious.   All other systems reviewed and are negative.    Allergies  Amoxicillin  Home Medications   Current Outpatient Rx  Name  Route  Sig  Dispense  Refill  . cyclobenzaprine (FLEXERIL) 10 MG tablet   Oral   Take 1 tablet (10 mg total) by mouth 2 (two) times daily as needed for muscle spasms.   20 tablet   0   . naproxen (NAPROSYN) 500 MG tablet   Oral   Take 1 tablet (500 mg total) by mouth 2 (two) times daily.   20 tablet   0   . ferrous sulfate 325 (65 FE) MG tablet   Oral   Take 1 tablet (325 mg total) by mouth daily.   30 tablet   0   . predniSONE (DELTASONE) 50 MG tablet      One tablet po qd x 3 days   3 tablet   0   . traMADol (ULTRAM) 50 MG tablet   Oral   Take 1 tablet (50 mg total) by mouth every 6 (six) hours as needed.   10 tablet   0    BP 127/84  Pulse 104  Temp(Src) 99.1 F (37.3 C) (Oral)  Resp 18  Ht 5\' 6"  (1.676 m)  Wt 135 lb (61.236 kg)  BMI 21.80  kg/m2  SpO2 100%  LMP 01/10/2013 Physical Exam  Nursing note and vitals reviewed. Constitutional: She is oriented to person, place, and time. She appears well-developed and well-nourished. No distress.  Patient appears tearful and anxious  HENT:  Head: Normocephalic and atraumatic.  Mouth/Throat: Oropharynx is clear and moist.  Recent extraction of #12, #17 and #32.  No edema, erythema or fluctuance of the gums.  Gums appear to be healing well.  Uvula midline, no edema  Eyes: EOM are normal. Pupils are equal, round, and reactive to light.  Neck: Normal range of motion and phonation normal. Neck supple. Muscular tenderness present. No spinous process tenderness present. No rigidity. No erythema and normal range of motion present. No Brudzinski's sign and no Kernig's  sign noted.    ttp of the left cervical paraspinal muscles and trapezius muscle.  No spinal tenderness.  Radial pulses brisk and equal, grip strength is strong and equal bilaterally.  Distal sensation intact.  Cardiovascular: Normal rate, regular rhythm, normal heart sounds and intact distal pulses.   No murmur heard. Pulmonary/Chest: Effort normal and breath sounds normal. No respiratory distress. She exhibits no tenderness.  Abdominal: Soft. She exhibits no distension. There is no tenderness.  Musculoskeletal: Normal range of motion. She exhibits no tenderness.  Lymphadenopathy:    She has no cervical adenopathy.  Neurological: She is alert and oriented to person, place, and time. No cranial nerve deficit or sensory deficit. She exhibits normal muscle tone. Coordination and gait normal. GCS eye subscore is 4. GCS verbal subscore is 5. GCS motor subscore is 6.  Reflex Scores:      Tricep reflexes are 2+ on the right side and 2+ on the left side.      Bicep reflexes are 2+ on the right side and 2+ on the left side.      Patellar reflexes are 2+ on the right side and 2+ on the left side.      Achilles reflexes are 2+ on the right side and 2+ on the left side. Patient moves all four extremities w/o difficulty,  Ambulates with a steady gait.    Skin: Skin is warm and dry.    ED Course  Procedures (including critical care time) Labs Review Labs Reviewed  CBC WITH DIFFERENTIAL - Abnormal; Notable for the following:    Hemoglobin 9.8 (*)    HCT 30.9 (*)    MCV 76.3 (*)    MCH 24.2 (*)    RDW 18.0 (*)    All other components within normal limits  BASIC METABOLIC PANEL - Abnormal; Notable for the following:    Glucose, Bld 108 (*)    BUN 3 (*)    All other components within normal limits   Imaging Review   Ct Soft Tissue Neck W Contrast  01/20/2013   CLINICAL DATA:  Dental pain.  Facial pain.  EXAM: CT NECK WITH CONTRAST  TECHNIQUE: Multidetector CT imaging of the neck was performed  using the standard protocol following the bolus administration of intravenous contrast.  CONTRAST:  87mL OMNIPAQUE IOHEXOL 300 MG/ML  SOLN  COMPARISON:  None.  FINDINGS: Empty sockets of the left upper first and second premolars and the bilateral lower terminal molars. There is some gas within the lower right molar socket, although it is predominated filled with granulation tissue or fluid. No evidence of complicating fracture. No significant surrounding soft tissue changes to suggest subperiosteal abscess or odontogenic soft tissue infection. The second premolar socket on the left does  extend towards the left maxillary sinus, which has focal mucosal thickening in the floor. There is no definite antral oral fistula at this time, although the patient is at risk for this outcome.  Symmetric and normal appearing salivary glands. Clear apical lungs. Patent major vessels in the neck. No acute osseous findings.  IMPRESSION: 1. Recent bilateral mandibular and left maxillary dental extractions. No evidence of complicating fracture or soft tissue infection. 2. Odontogenic chronic sinusitis in the left maxillary antrum. Empty left premolar sockets extend into the lower maxillary sinus, without oral antral fistula at this time.   Electronically Signed   By: Jorje Guild M.D.   On: 01/20/2013 23:25    EKG Interpretation   None       MDM   1. Anemia   2. Chronic sinusitis     Patient is very anxious and tearful.  Concerned that she may have an infection or "brain tumor".  I have tried to reassure the patient that she does not have any clinical evidence of a brain tumor.  I will order CT scan of the soft tissue neck to look for any possible abscess given recent dental problems.  Pt complains of intermittent facial numbness and intermittent swelling which are not evident on exam. No nuchal rigidity, no focal neuro deficits, patient is overall well appearing.   Generalized weakness likely related to diarrhea and  possible mild dehydration.  IVF's and labs ordered.    Patient has received 2L IV fluids.  Labs discussed, hgb low and pt admits to hx of anemia in her teens. Discussed CT results.  Sx's possibly related to sinusitis.  I will try short prednisone burst, ultram for pain, and prescription for supplemental iron.  Patient appears stable for discharge and agrees to close f/u with her PMD.    Jyssica Rief L. Libni Fusaro, PA-C 01/22/13 0205

## 2013-01-24 NOTE — ED Provider Notes (Signed)
Medical screening examination/treatment/procedure(s) were performed by non-physician practitioner and as supervising physician I was immediately available for consultation/collaboration.  Orpah Greek, MD 01/24/13 [phone]

## 2013-01-26 ENCOUNTER — Ambulatory Visit (HOSPITAL_COMMUNITY)
Admission: RE | Admit: 2013-01-26 | Discharge: 2013-01-26 | Disposition: A | Discharge status: Home / Self Care | Payer: Medicaid Other | Source: Ambulatory Visit | Attending: Family Medicine | Admitting: Family Medicine

## 2013-01-26 ENCOUNTER — Other Ambulatory Visit (HOSPITAL_COMMUNITY): Payer: Self-pay | Admitting: Family Medicine

## 2013-01-26 DIAGNOSIS — R221 Localized swelling, mass and lump, neck: Secondary | ICD-10-CM

## 2013-01-26 DIAGNOSIS — M5020 Other cervical disc displacement, unspecified cervical region: Secondary | ICD-10-CM | POA: Insufficient documentation

## 2013-01-26 DIAGNOSIS — R209 Unspecified disturbances of skin sensation: Secondary | ICD-10-CM | POA: Insufficient documentation

## 2013-01-26 DIAGNOSIS — M542 Cervicalgia: Secondary | ICD-10-CM | POA: Insufficient documentation

## 2013-01-26 DIAGNOSIS — G9389 Other specified disorders of brain: Secondary | ICD-10-CM | POA: Insufficient documentation

## 2013-01-26 DIAGNOSIS — M5412 Radiculopathy, cervical region: Secondary | ICD-10-CM

## 2013-01-26 DIAGNOSIS — R220 Localized swelling, mass and lump, head: Secondary | ICD-10-CM | POA: Insufficient documentation

## 2013-01-26 DIAGNOSIS — M4802 Spinal stenosis, cervical region: Secondary | ICD-10-CM | POA: Insufficient documentation

## 2013-01-26 MED ORDER — GADOBENATE DIMEGLUMINE 529 MG/ML IV SOLN
13.0000 mL | Freq: Once | INTRAVENOUS | Status: AC | PRN
  Administered 2013-01-26: 13 mL via INTRAVENOUS

## 2013-02-03 ENCOUNTER — Other Ambulatory Visit (HOSPITAL_COMMUNITY)
Admission: RE | Admit: 2013-02-03 | Discharge: 2013-02-03 | Disposition: A | Discharge status: Home / Self Care | Payer: Medicaid Other | Source: Ambulatory Visit | Attending: Otolaryngology | Admitting: Otolaryngology

## 2013-02-03 ENCOUNTER — Ambulatory Visit (INDEPENDENT_AMBULATORY_CARE_PROVIDER_SITE_OTHER): Payer: Medicaid Other | Admitting: Otolaryngology

## 2013-02-03 DIAGNOSIS — D119 Benign neoplasm of major salivary gland, unspecified: Secondary | ICD-10-CM | POA: Insufficient documentation

## 2013-02-03 DIAGNOSIS — D37039 Neoplasm of uncertain behavior of the major salivary glands, unspecified: Secondary | ICD-10-CM

## 2013-02-06 DIAGNOSIS — K118 Other diseases of salivary glands: Secondary | ICD-10-CM

## 2013-02-06 DIAGNOSIS — I82629 Acute embolism and thrombosis of deep veins of unspecified upper extremity: Secondary | ICD-10-CM

## 2013-02-06 HISTORY — DX: Acute embolism and thrombosis of deep veins of unspecified upper extremity: I82.629

## 2013-02-06 HISTORY — DX: Other diseases of salivary glands: K11.8

## 2013-02-10 ENCOUNTER — Ambulatory Visit (INDEPENDENT_AMBULATORY_CARE_PROVIDER_SITE_OTHER): Payer: BC Managed Care – PPO | Admitting: Otolaryngology

## 2013-02-10 DIAGNOSIS — D37039 Neoplasm of uncertain behavior of the major salivary glands, unspecified: Secondary | ICD-10-CM

## 2013-02-23 ENCOUNTER — Other Ambulatory Visit: Payer: Self-pay | Admitting: Otolaryngology

## 2013-03-03 ENCOUNTER — Encounter (HOSPITAL_COMMUNITY): Payer: Self-pay | Admitting: Emergency Medicine

## 2013-03-03 ENCOUNTER — Emergency Department (HOSPITAL_COMMUNITY): Payer: BC Managed Care – PPO

## 2013-03-03 ENCOUNTER — Emergency Department (HOSPITAL_COMMUNITY)
Admission: EM | Admit: 2013-03-03 | Discharge: 2013-03-03 | Disposition: A | Discharge status: Home / Self Care | Payer: BC Managed Care – PPO | Attending: Emergency Medicine | Admitting: Emergency Medicine

## 2013-03-03 DIAGNOSIS — R21 Rash and other nonspecific skin eruption: Secondary | ICD-10-CM | POA: Insufficient documentation

## 2013-03-03 DIAGNOSIS — I82409 Acute embolism and thrombosis of unspecified deep veins of unspecified lower extremity: Secondary | ICD-10-CM

## 2013-03-03 DIAGNOSIS — Z791 Long term (current) use of non-steroidal anti-inflammatories (NSAID): Secondary | ICD-10-CM | POA: Insufficient documentation

## 2013-03-03 MED ORDER — RIVAROXABAN 15 MG PO TABS
15.0000 mg | ORAL_TABLET | Freq: Once | ORAL | Status: AC
  Administered 2013-03-03: 15 mg via ORAL
  Filled 2013-03-03: qty 1

## 2013-03-03 MED ORDER — RIVAROXABAN 15 MG PO TABS: 15.0000 mg | ORAL_TABLET | Freq: Two times a day (BID) | ORAL | Status: DC

## 2013-03-03 NOTE — ED Notes (Signed)
Pt alert & oriented x4, stable gait. Patient given discharge instructions, paperwork & prescription(s). Patient  instructed to stop at the registration desk to finish any additional paperwork. Patient verbalized understanding. Pt left department w/ no further questions. 

## 2013-03-03 NOTE — Discharge Instructions (Signed)
Deep Vein Thrombosis A deep vein thrombosis (DVT) is a blood clot that develops in the deep, larger veins of the leg, arm, or pelvis. These are more dangerous than clots that might form in veins near the surface of the body. A DVT can lead to complications if the clot breaks off and travels in the bloodstream to the lungs.  A DVT can damage the valves in your leg veins, so that instead of flowing upward, the blood pools in the lower leg. This is called post-thrombotic syndrome, and it can result in pain, swelling, discoloration, and sores on the leg. CAUSES Usually, several things contribute to blood clots forming. Contributing factors include:  The flow of blood slows down.  The inside of the vein is damaged in some way.  You have a condition that makes blood clot more easily. RISK FACTORS Some people are more likely than others to develop blood clots. Risk factors include:   Older age, especially over 53 years of age.  Having a family history of blood clots or if you have already had a blot clot.  Having major or lengthy surgery. This is especially true for surgery on the hip, knee, or belly (abdomen). Hip surgery is particularly high risk.  Breaking a hip or leg.  Sitting or lying still for a long time. This includes long-distance travel, paralysis, or recovery from an illness or surgery.  Having cancer or cancer treatment.  Having a long, thin tube (catheter) placed inside a vein during a medical procedure.  Being overweight (obese).  Pregnancy and childbirth.  Hormone changes make the blood clot more easily during pregnancy.  The fetus puts pressure on the veins of the pelvis.  There is a risk of injury to veins during delivery or a caesarean. The risk is highest just after childbirth.  Medicines with the female hormone estrogen. This includes birth control pills and hormone replacement therapy.  Smoking.  Other circulation or heart problems.  SIGNS AND SYMPTOMS When  a clot forms, it can either partially or totally block the blood flow in that vein. Symptoms of a DVT can include:  Swelling of the leg or arm, especially if one side is much worse.  Warmth and redness of the leg or arm, especially if one side is much worse.  Pain in an arm or leg. If the clot is in the leg, symptoms may be more noticeable or worse when standing or walking. The symptoms of a DVT that has traveled to the lungs (pulmonary embolism, PE) usually start suddenly and include:  Shortness of breath.  Coughing.  Coughing up blood or blood-tinged phlegm.  Chest pain. The chest pain is often worse with deep breaths.  Rapid heartbeat. Anyone with these symptoms should get emergency medical treatment right away. Call your local emergency services (911 in the [place] if you have these symptoms. DIAGNOSIS If a DVT is suspected, your health care provider will take a full medical history and perform a physical exam. Tests that also may be required include:  Blood tests, including studies of the clotting properties of the blood.  Ultrasonography to see if you have clots in your legs or lungs.  X-rays to show the flow of blood when dye is injected into the veins (venography).  Studies of your lungs if you have any chest symptoms. PREVENTION  Exercise the legs regularly. Take a brisk 30-minute walk every day.  Maintain a weight that is appropriate for your height.  Avoid sitting or lying in bed  for long periods of time without moving your legs.  Women, particularly those over the age of 24 years, should consider the risks and benefits of taking estrogen medicines, including birth control pills.  Do not smoke, especially if you take estrogen medicines.  Long-distance travel can increase your risk of DVT. You should exercise your legs by walking or pumping the muscles every hour.  In-hospital prevention:  Many of the risk factors above relate to situations that exist with  hospitalization, either for illness, injury, or elective surgery.  Your health care provider will assess you for the need for venous thromboembolism prophylaxis when you are admitted to the hospital. If you are having surgery, your surgeon will assess you the day of or day after surgery.  Prevention may include medical and nonmedical measures. TREATMENT Once identified, a DVT can be treated. It can also be prevented in some circumstances. Once you have had a DVT, you may be at increased risk for a DVT in the future. The most common treatment for DVT is blood thinning (anticoagulant) medicine, which reduces the blood's tendency to clot. Anticoagulants can stop new blood clots from forming and stop old ones from growing. They cannot dissolve existing clots. Your body does this by itself over time. Anticoagulants can be given by mouth, by IV access, or by injection. Your health care provider will determine the best program for you. Other medicines or treatments that may be used are:  Heparin or related medicines (low molecular weight heparin) are usually the first treatment for a blood clot. They act quickly. However, they cannot be taken orally.  Heparin can cause a fall in a component of blood that stops bleeding and forms blood clots (platelets). You will be monitored with blood tests to be sure this does not occur.  Warfarin is an anticoagulant that can be swallowed. It takes a few days to start working, so usually heparin or related medicines are used in combination. Once warfarin is working, heparin is usually stopped.  Less commonly, clot dissolving drugs (thrombolytics) are used to dissolve a DVT. They carry a high risk of bleeding, so they are used mainly in severe cases, where your life or a limb is threatened.  Very rarely, a blood clot in the leg needs to be removed surgically.  If you are unable to take anticoagulants, your health care provider may arrange for you to have a filter placed  in a main vein in your abdomen. This filter prevents clots from traveling to your lungs. HOME CARE INSTRUCTIONS  Take all medicines prescribed by your health care provider. Only take over-the-counter or prescription medicines for pain, fever, or discomfort as directed by your health care provider.  Warfarin. Most people will continue taking warfarin after hospital discharge. Your health care provider will advise you on the length of treatment (usually 3 6 months, sometimes lifelong).  Too much and too little warfarin are both dangerous. Too much warfarin increases the risk of bleeding. Too little warfarin continues to allow the risk for blood clots. While taking warfarin, you will need to have regular blood tests to measure your blood clotting time. These blood tests usually include both the prothrombin time (PT) and international normalized ratio (INR) tests. The PT and INR results allow your health care provider to adjust your dose of warfarin. The dose can change for many reasons. It is critically important that you take warfarin exactly as prescribed, and that you have your PT and INR levels drawn exactly as directed.  Many foods, especially foods high in vitamin K, can interfere with warfarin and affect the PT and INR results. Foods high in vitamin K include spinach, kale, broccoli, cabbage, collard and turnip greens, brussel sprouts, peas, cauliflower, seaweed, and parsley as well as beef and pork liver, green tea, and soybean oil. You should eat a consistent amount of foods high in vitamin K. Avoid major changes in your diet, or notify your health care provider before changing your diet. Arrange a visit with a dietitian to answer your questions.  Many medicines can interfere with warfarin and affect the PT and INR results. You must tell your health care provider about any and all medicines you take. This includes all vitamins and supplements. Be especially cautious with aspirin and  anti-inflammatory medicines. Ask your health care provider before taking these. Do not take or discontinue any prescribed or over-the-counter medicine except on the advice of your health care provider or pharmacist.  Warfarin can have side effects, primarily excessive bruising or bleeding. You will need to hold pressure over cuts for longer than usual. Your health care provider or pharmacist will discuss other potential side effects.  Alcohol can change the body's ability to handle warfarin. It is best to avoid alcoholic drinks or consume only very small amounts while taking warfarin. Notify your health care provider if you change your alcohol intake.  Notify your dentist or other health care providers before procedures.  Activity. Ask your health care provider how soon you can go back to normal activities. It is important to stay active to prevent blood clots. If you are on anticoagulant medicine, avoid contact sports.  Exercise. It is very important to exercise. This is especially important while traveling, sitting, or standing for long periods of time. Exercise your legs by walking or by pumping the muscles frequently. Take frequent walks.  Compression stockings. These are tight elastic stockings that apply pressure to the lower legs. This pressure can help keep the blood in the legs from clotting. You may need to wear compression stockings at home to help prevent a DVT.  Do not smoke. If you smoke, quit. Ask your health care provider for help with quitting smoking.  Learn as much as you can about DVT. Knowing more about the condition should help you keep it from coming back.  Wear a medical alert bracelet or carry a medical alert card. SEEK MEDICAL CARE IF:  You notice a rapid heartbeat.  You feel weaker or more tired than usual.  You feel faint.  You notice increased bruising.  You feel your symptoms are not getting better in the time expected.  You believe you are having side  effects of medicine. SEEK IMMEDIATE MEDICAL CARE IF:  You have chest pain.  You have trouble breathing.  You have new or increased swelling or pain in one leg.  You cough up blood.  You notice blood in vomit, in a bowel movement, or in urine. MAKE SURE YOU:  Understand these instructions.  Will watch your condition.  Will get help right away if you are not doing well or get worse. Document Released: 12/23/2004 Document Revised: 10/13/2012 Document Reviewed: 08/30/2012 [facility] Patient Information 2014 [place].  Information on my medicine - XARELTO (rivaroxaban)  This medication education was reviewed with me or my healthcare representative as part of my discharge preparation.  The pharmacist that spoke with me during my hospital stay was:  Biagio Borg, [place]? Xarelto was  prescribed to treat blood clots that may have been found in the veins of your legs (deep vein thrombosis) or in your lungs (pulmonary embolism) and to reduce the risk of them occurring again.  What do you need to know about Xarelto? The starting dose is one 15 mg tablet taken TWICE daily with food for the FIRST 21 DAYS then on 03/24/2013  the dose is changed to one 20 mg tablet taken ONCE A DAY with your evening meal.  DO NOT stop taking Xarelto without talking to the health care provider who prescribed the medication.  Refill your prescription for 20 mg tablets before you run out.  After discharge, you should have regular check-up appointments with your healthcare provider that is prescribing your Xarelto.  In the future your dose may need to be changed if your kidney function changes by a significant amount.  What do you do if you miss a dose? If you are taking Xarelto TWICE DAILY and you miss a dose, take it as soon as you remember. You may take two 15 mg tablets (total 30 mg) at the same time then resume your regularly scheduled 15 mg twice daily  the next day.  If you are taking Xarelto ONCE DAILY and you miss a dose, take it as soon as you remember on the same day then continue your regularly scheduled once daily regimen the next day. Do not take two doses of Xarelto at the same time.   Important Safety Information Xarelto is a blood thinner medicine that can cause bleeding. You should call your healthcare provider right away if you experience any of the following:   Bleeding from an injury or your nose that does not stop.   Unusual colored urine (red or dark brown) or unusual colored stools (red or black).   Unusual bruising for unknown reasons.   A serious fall or if you hit your head (even if there is no bleeding).  Some medicines may interact with Xarelto and might increase your risk of bleeding while on Xarelto. To help avoid this, consult your healthcare provider or pharmacist prior to using any new prescription or non-prescription medications, including herbals, vitamins, non-steroidal anti-inflammatory drugs (NSAIDs) and supplements.  This website has more information on Xarelto: [email or web address]

## 2013-03-03 NOTE — ED Provider Notes (Signed)
CSN: [id number]     Arrival date & time 03/03/13  6629 History   First MD Initiated Contact with Patient 03/03/13 1009     Chief Complaint  Patient presents with  . IV site red      (Consider location/radiation/quality/duration/timing/severity/associated sxs/prior Treatment) HPI Comments: Patient presents to the ER for evaluation of pain and swelling of her left arm. Patient reports that she had an IV in the left wrist area last week. She started having pain and tenderness over the vein where the IV was, now the area has propagated up the arm and there is some redness associated with the symptoms. She was told by her doctor to come to the ER if the area continues to swell or turns red. She has not had any fever. There is no shortness of breath.   Past Medical History  Diagnosis Date  . Normal cardiac stress test 2009    normal coronary CT scan   Past Surgical History  Procedure Laterality Date  . Wisdom tooth extraction    . Parotid gland tumor excision     No family history on file. History  Substance Use Topics  . Smoking status: Never Smoker   . Smokeless tobacco: Not on file  . Alcohol Use: No   OB History   Grav Para Term Preterm Abortions TAB SAB Ect Mult Living                 Review of Systems  Respiratory: Negative for shortness of breath.   Musculoskeletal:       Arm pain  Skin: Positive for rash.      Allergies  Amoxicillin  Home Medications   Current Outpatient Rx  Name  Route  Sig  Dispense  Refill  . acetaminophen (TYLENOL CHILDRENS) 160 MG/5ML suspension   Oral   Take 480 mg by mouth every 6 (six) hours as needed for moderate pain.         Marland Kitchen acetaminophen-codeine (TYLENOL #3) 300-30 MG per tablet   Oral   Take 1 tablet by mouth every 4 (four) hours as needed for moderate pain or severe pain.         . diazepam (VALIUM) 2 MG tablet   Oral   Take 2 mg by mouth every 8 (eight) hours as needed for anxiety.         Marland Kitchen ibuprofen (IBUPROFEN)  100 MG/5ML suspension   Oral   Take 300 mg by mouth every 4 (four) hours as needed for moderate pain.         . naproxen (NAPROSYN) 500 MG tablet   Oral   Take 1 tablet (500 mg total) by mouth 2 (two) times daily.   20 tablet   0    BP 126/64  Pulse 111  Temp(Src) 98.1 F (36.7 C) (Oral)  Resp 20  Ht 5\' 3"  (1.6 m)  Wt 139 lb (63.05 kg)  BMI 24.63 kg/m2  SpO2 99%  LMP 02/23/2013 Physical Exam  Constitutional: She is oriented to person, place, and time. She appears well-developed and well-nourished. No distress.  HENT:  Head: Normocephalic and atraumatic.  Right Ear: Hearing normal.  Left Ear: Hearing normal.  Nose: Nose normal.  Mouth/Throat: Oropharynx is clear and moist and mucous membranes are normal.  Eyes: Conjunctivae and EOM are normal. Pupils are equal, round, and reactive to light.  Neck: Normal range of motion. Neck supple.  Cardiovascular: Regular rhythm, S1 normal and S2 normal.  Exam reveals no gallop  and no friction rub.   No murmur heard. Pulmonary/Chest: Effort normal and breath sounds normal. No respiratory distress. She exhibits no tenderness.  Abdominal: Soft. Normal appearance and bowel sounds are normal. There is no hepatosplenomegaly. There is no tenderness. There is no rebound, no guarding, no tenderness at McBurney's point and negative Murphy's sign. No hernia.  Musculoskeletal: Normal range of motion.       Arms: Neurological: She is alert and oriented to person, place, and time. She has normal strength. No cranial nerve deficit or sensory deficit. Coordination normal. GCS eye subscore is 4. GCS verbal subscore is 5. GCS motor subscore is 6.  Skin: Skin is warm, dry and intact. No rash noted. No cyanosis.     Psychiatric: She has a normal mood and affect. Her speech is normal and behavior is normal. Thought content normal.    ED Course  Procedures (including critical care time) Labs Review Labs Reviewed - No data to display Imaging Review US  Venous Img Upper Uni Left  03/03/2013   CLINICAL DATA:  Pain post parotid surgery.  Edema, color changes.  EXAM: LEFT UPPER EXTREMITY VENOUS DOPPLER ULTRASOUND  TECHNIQUE: Gray-scale sonography with graded compression, as well as color Doppler and duplex ultrasound were performed to evaluate the upper extremity deep venous system from the level of the subclavian vein and including the jugular, axillary, basilic and upper cephalic vein. Spectral Doppler was utilized to evaluate flow at rest and with distal augmentation maneuvers.  COMPARISON:  None.  FINDINGS: Thrombus within deep veins: Segmentally in one component of the duplicated left brachial vein.  Compressibility of deep veins: Decreased in the affected segment brachial vein  Duplex waveform respiratory phasicity:  Normal.  Duplex waveform response to augmentation: Deferred secondary to presence of thrombus.  Venous reflux:  None visualized.  Other findings: There is also thrombosis of the cephalic vein at the level of the rest, with normal compressibility centrally. There is partial thrombosis of the basilic vein.  IMPRESSION: 1. Partially occlusive left brachial vein DVT. 2. Superficial thrombophlebitis involving basilic vein and cephalic vein in the forearm.   Electronically Signed   By: Arne Cleveland M.D.   On: 03/03/2013 11:23    EKG Interpretation   None       MDM   Final diagnoses:  DVT left arm   Patient presents to the ER for evaluation of pain and swelling after IV. Labs from last week were reviewed and are unremarkable. Examination did reveal evidence of at least superficial thrombophlebitis at the site of the IV, but symptoms propagated above the elbow. Venous ultrasound was therefore performed. There is evidence of DVT as well as superficial thrombosis. The patient will be initiated on his echo, outpatient followup with primary doctor.    Orpah Greek, MD 03/03/13 [phone]

## 2013-03-03 NOTE — ED Notes (Signed)
Pt reports had tumor removed off of parotid gland Wednesday in [place] and had IV in left forearm.  Area above iV site red and c/o pain.

## 2013-03-07 ENCOUNTER — Encounter (HOSPITAL_COMMUNITY): Payer: Self-pay | Admitting: Emergency Medicine

## 2013-03-07 ENCOUNTER — Emergency Department (HOSPITAL_COMMUNITY)
Admission: EM | Admit: 2013-03-07 | Discharge: 2013-03-07 | Disposition: A | Discharge status: Home / Self Care | Payer: BC Managed Care – PPO | Attending: Emergency Medicine | Admitting: Emergency Medicine

## 2013-03-07 DIAGNOSIS — M79602 Pain in left arm: Secondary | ICD-10-CM

## 2013-03-07 DIAGNOSIS — F411 Generalized anxiety disorder: Secondary | ICD-10-CM | POA: Insufficient documentation

## 2013-03-07 DIAGNOSIS — Z79899 Other long term (current) drug therapy: Secondary | ICD-10-CM | POA: Insufficient documentation

## 2013-03-07 DIAGNOSIS — M79609 Pain in unspecified limb: Secondary | ICD-10-CM | POA: Insufficient documentation

## 2013-03-07 DIAGNOSIS — Z7901 Long term (current) use of anticoagulants: Secondary | ICD-10-CM | POA: Insufficient documentation

## 2013-03-07 DIAGNOSIS — Z86718 Personal history of other venous thrombosis and embolism: Secondary | ICD-10-CM | POA: Insufficient documentation

## 2013-03-07 NOTE — ED Provider Notes (Signed)
CSN: [id number]     Arrival date & time 03/07/13  1700 History   First MD Initiated Contact with Patient 03/07/13 1717     Chief Complaint  Patient presents with  . Arm Pain     (Consider location/radiation/quality/duration/timing/severity/associated sxs/prior Treatment) HPI Comments: Victoria Stein is a 43 y.o. female who presents to the Emergency Department for re-evaluated for pain to her left arm.  Patient was seen here on 03/03/13 for arm pain and found to have a partial DVT of the upper arm that was secondary to an IV placement for surgery to the right parotid gland tumor  States at the pain was initially to the inside of her left upper arm and now has "moved" to the outside of the left upper arm.  She states nothing makes the pain better or worse.  She denies redness or swelling or the arm, discoloration,  numbness or weakness.  She also denies chest pain, shortness of breath, fever, or chills.  She states that she was advised by a nurse to return here for re-evaluation tf the pain   The history is provided by the patient.    Past Medical History  Diagnosis Date  . Normal cardiac stress test 2009    normal coronary CT scan   Past Surgical History  Procedure Laterality Date  . Wisdom tooth extraction    . Parotid gland tumor excision     History reviewed. No pertinent family history. History  Substance Use Topics  . Smoking status: Never Smoker   . Smokeless tobacco: Not on file  . Alcohol Use: No   OB History   Grav Para Term Preterm Abortions TAB SAB Ect Mult Living                 Review of Systems  Constitutional: Negative for fever and chills.  Respiratory: Negative for cough, chest tightness and shortness of breath.   Cardiovascular: Negative for chest pain.  Genitourinary: Negative for dysuria and difficulty urinating.  Musculoskeletal: Positive for myalgias. Negative for joint swelling and neck pain.       Left upper arm pain  Skin: Negative for color change,  rash and wound.  Neurological: Negative for dizziness and headaches.  All other systems reviewed and are negative.      Allergies  Amoxicillin  Home Medications   Current Outpatient Rx  Name  Route  Sig  Dispense  Refill  . acetaminophen (TYLENOL CHILDRENS) 160 MG/5ML suspension   Oral   Take 480 mg by mouth every 6 (six) hours as needed for moderate pain.         Marland Kitchen acetaminophen-codeine (TYLENOL #3) 300-30 MG per tablet   Oral   Take 1 tablet by mouth every 4 (four) hours as needed for moderate pain or severe pain.         . diazepam (VALIUM) 2 MG tablet   Oral   Take 2 mg by mouth every 8 (eight) hours as needed for anxiety.         . Rivaroxaban (XARELTO) 15 MG TABS tablet   Oral   Take 1 tablet (15 mg total) by mouth 2 (two) times daily.   42 tablet   0    BP 136/92  Pulse 89  Temp(Src) 98.3 F (36.8 C) (Oral)  Resp 18  Ht 5\' 3"  (1.6 m)  Wt 139 lb (63.05 kg)  BMI 24.63 kg/m2  SpO2 100%  LMP 02/24/2013 Physical Exam  Nursing note and vitals reviewed.  Constitutional: She is oriented to person, place, and time. She appears well-developed and well-nourished. No distress.  HENT:  Head: Normocephalic and atraumatic.  Neck: Normal range of motion. Neck supple.  Cardiovascular: Normal rate, regular rhythm, normal heart sounds and intact distal pulses.   No murmur heard. Pulmonary/Chest: Effort normal and breath sounds normal. No respiratory distress. She has no wheezes. She has no rales. She exhibits no tenderness.  Musculoskeletal: Normal range of motion. She exhibits no edema.       Right shoulder: She exhibits tenderness. She exhibits normal range of motion, no bony tenderness, no swelling, no effusion, no crepitus, no pain, normal pulse and normal strength.       Arms: Patient describes tenderness of the medial and lateral aspect of the left upper arm.  Pt has full ROM of the arm, grip strength is strong and equal bilaterally, radial pulse is palpable, no  edema or erythema of the arm. No lymphangitis  Lymphadenopathy:    She has no cervical adenopathy.  Neurological: She is alert and oriented to person, place, and time. She has normal strength. Coordination normal.  Reflex Scores:      Tricep reflexes are 2+ on the right side and 2+ on the left side.      Bicep reflexes are 2+ on the right side and 2+ on the left side. Skin: Skin is warm and dry. No rash noted. No erythema.    ED Course  Procedures (including critical care time) Labs Review Labs Reviewed - No data to display Imaging Review No results found.   EKG Interpretation None      MDM   Final diagnoses:  None   Previous US results reviewed, pt with paritial DVT of the left arm.  NV intact.  VSS.  No hx of chest pain, dyspnea or tachycardia.  No concerning sx's for PE.  Pt on anti-coagulation therapy with Jennye Moccasin  Discussed patient hx and exam findings with EDP, Dr. Stevie Kern.  Pt has appt with her PMD this week.  She is anxious appearing, but non-toxic.  Patient advised to return here for any worsening symptoms, pt verbalized understanding and agrees.  The patient appears reasonably screened and/or stabilized for discharge and I doubt any other medical condition or other [facility] requiring further screening, evaluation, or treatment in the ED at this time prior to discharge.    Moritz Lever L. Vanessa [place], PA-C 03/08/13 1637

## 2013-03-07 NOTE — ED Notes (Signed)
Pt had parotid gland removed 2/20 and had an IV done in lt wrist. Had redness of arm and doppler study showed a blood clot in lt arm  Now  Increasing pain and pain in lt shoulder. Returned for eval of this pain.

## 2013-03-08 ENCOUNTER — Inpatient Hospital Stay (HOSPITAL_COMMUNITY)
Admission: EM | Admit: 2013-03-08 | Discharge: 2013-03-10 | DRG: 176 | Disposition: A | Discharge status: Home Health | Payer: BC Managed Care – PPO | Attending: Internal Medicine | Admitting: Internal Medicine

## 2013-03-08 ENCOUNTER — Encounter (HOSPITAL_COMMUNITY): Payer: Self-pay | Admitting: Emergency Medicine

## 2013-03-08 ENCOUNTER — Emergency Department (HOSPITAL_COMMUNITY): Payer: BC Managed Care – PPO

## 2013-03-08 ENCOUNTER — Observation Stay (HOSPITAL_COMMUNITY): Payer: BC Managed Care – PPO

## 2013-03-08 DIAGNOSIS — F419 Anxiety disorder, unspecified: Secondary | ICD-10-CM | POA: Diagnosis present

## 2013-03-08 DIAGNOSIS — Z86718 Personal history of other venous thrombosis and embolism: Secondary | ICD-10-CM

## 2013-03-08 DIAGNOSIS — F410 Panic disorder [episodic paroxysmal anxiety] without agoraphobia: Secondary | ICD-10-CM | POA: Diagnosis present

## 2013-03-08 DIAGNOSIS — D500 Iron deficiency anemia secondary to blood loss (chronic): Secondary | ICD-10-CM | POA: Diagnosis present

## 2013-03-08 DIAGNOSIS — F411 Generalized anxiety disorder: Secondary | ICD-10-CM | POA: Diagnosis present

## 2013-03-08 DIAGNOSIS — I5189 Other ill-defined heart diseases: Secondary | ICD-10-CM | POA: Diagnosis present

## 2013-03-08 DIAGNOSIS — D509 Iron deficiency anemia, unspecified: Secondary | ICD-10-CM

## 2013-03-08 DIAGNOSIS — Z8249 Family history of ischemic heart disease and other diseases of the circulatory system: Secondary | ICD-10-CM

## 2013-03-08 DIAGNOSIS — I2699 Other pulmonary embolism without acute cor pulmonale: Principal | ICD-10-CM | POA: Diagnosis present

## 2013-03-08 DIAGNOSIS — I82629 Acute embolism and thrombosis of deep veins of unspecified upper extremity: Secondary | ICD-10-CM | POA: Diagnosis present

## 2013-03-08 DIAGNOSIS — Z833 Family history of diabetes mellitus: Secondary | ICD-10-CM

## 2013-03-08 HISTORY — DX: Acute embolism and thrombosis of deep veins of unspecified upper extremity: I82.629

## 2013-03-08 HISTORY — DX: Iron deficiency anemia, unspecified: D50.9

## 2013-03-08 HISTORY — DX: Anxiety disorder, unspecified: F41.9

## 2013-03-08 HISTORY — DX: Other diseases of salivary glands: K11.8

## 2013-03-08 HISTORY — DX: Other ill-defined heart diseases: I51.89

## 2013-03-08 HISTORY — DX: Other pulmonary embolism without acute cor pulmonale: I26.99

## 2013-03-08 LAB — BASIC METABOLIC PANEL
BUN: 11 mg/dL (ref 6–23)
CO2: 26 mEq/L (ref 19–32)
Calcium: 9.2 mg/dL (ref 8.4–10.5)
Chloride: 102 mEq/L (ref 96–112)
Creatinine, Ser: 0.77 mg/dL (ref 0.50–1.10)
GFR calc non Af Amer: >90 mL/min (ref >90)
GLUCOSE: 108 mg/dL — AB (ref 70–99)
Potassium: 3.6 mEq/L — ABNORMAL LOW (ref 3.7–5.3)
SODIUM: 140 meq/L (ref 137–147)

## 2013-03-08 LAB — CBC WITH DIFFERENTIAL/PLATELET
Basophils Absolute: 0 10*3/uL (ref 0.0–0.1)
Basophils Relative: 0 % (ref 0–1)
Eosinophils Absolute: 0.1 10*3/uL (ref 0.0–0.7)
Eosinophils Relative: 1 % (ref 0–5)
HCT: 33 % — ABNORMAL LOW (ref 36.0–46.0)
Hemoglobin: 10.3 g/dL — ABNORMAL LOW (ref 12.0–15.0)
LYMPHS ABS: 2.3 10*3/uL (ref 0.7–4.0)
Lymphocytes Relative: 26 % (ref 12–46)
MCH: 23.9 pg — AB (ref 26.0–34.0)
MCHC: 31.2 g/dL (ref 30.0–36.0)
MCV: 76.6 fL — ABNORMAL LOW (ref 78.0–100.0)
Monocytes Absolute: 0.8 10*3/uL (ref 0.1–1.0)
Monocytes Relative: 9 % (ref 3–12)
Neutro Abs: 5.7 10*3/uL (ref 1.7–7.7)
Neutrophils Relative %: 64 % (ref 43–77)
Platelets: 215 10*3/uL (ref 150–400)
RBC: 4.31 MIL/uL (ref 3.87–5.11)
RDW: 17.2 % — ABNORMAL HIGH (ref 11.5–15.5)
WBC: 8.8 10*3/uL (ref 4.0–10.5)

## 2013-03-08 LAB — ANTITHROMBIN III: AntiThromb III Func: 117 % (ref 75–120)

## 2013-03-08 LAB — TROPONIN I
Troponin I: <0.3 ng/mL (ref <0.30)
Troponin I: <0.3 ng/mL (ref <0.30)

## 2013-03-08 LAB — PROTIME-INR
INR: 2.38 — ABNORMAL HIGH (ref 0.00–1.49)
INR: 1.24 (ref 0.00–1.49)
Prothrombin Time: 25.2 seconds — ABNORMAL HIGH (ref 11.6–15.2)
Prothrombin Time: 15.3 seconds — ABNORMAL HIGH (ref 11.6–15.2)

## 2013-03-08 LAB — MRSA PCR SCREENING: MRSA BY PCR: NEGATIVE

## 2013-03-08 LAB — PREGNANCY, URINE: Preg Test, Ur: NEGATIVE

## 2013-03-08 LAB — HOMOCYSTEINE: HOMOCYSTEINE-NORM: 11.9 umol/L (ref 4.0–15.4)

## 2013-03-08 LAB — APTT: APTT: 37 s (ref 24–37)

## 2013-03-08 LAB — D-DIMER, QUANTITATIVE (NOT AT ARMC): D DIMER QUANT: 1.01 ug{FEU}/mL — AB (ref 0.00–0.48)

## 2013-03-08 MED ORDER — SODIUM CHLORIDE 0.9 % IJ SOLN
3.0000 mL | INTRAMUSCULAR | Status: DC | PRN
  Administered 2013-03-10: 3 mL via INTRAVENOUS

## 2013-03-08 MED ORDER — ALPRAZOLAM 0.5 MG PO TABS
0.5000 mg | ORAL_TABLET | Freq: Three times a day (TID) | ORAL | Status: DC | PRN
Start: 2013-03-08 — End: 2013-03-09
  Administered 2013-03-08 – 2013-03-09 (×2): 0.5 mg via ORAL
  Filled 2013-03-08 (×3): qty 1

## 2013-03-08 MED ORDER — SODIUM CHLORIDE 0.9 % IJ SOLN
3.0000 mL | Freq: Two times a day (BID) | INTRAMUSCULAR | Status: DC
  Administered 2013-03-08 – 2013-03-09 (×2): 3 mL via INTRAVENOUS

## 2013-03-08 MED ORDER — IOHEXOL 350 MG/ML SOLN
100.0000 mL | Freq: Once | INTRAVENOUS | Status: AC | PRN
  Administered 2013-03-08: 100 mL via INTRAVENOUS

## 2013-03-08 MED ORDER — WARFARIN - PHARMACIST DOSING INPATIENT: Status: DC

## 2013-03-08 MED ORDER — DOCUSATE SODIUM 100 MG PO CAPS
100.0000 mg | ORAL_CAPSULE | Freq: Two times a day (BID) | ORAL | Status: DC
  Administered 2013-03-08 – 2013-03-10 (×5): 100 mg via ORAL
  Filled 2013-03-08 (×5): qty 1

## 2013-03-08 MED ORDER — ENOXAPARIN SODIUM 60 MG/0.6ML ~~LOC~~ SOLN
60.0000 mg | Freq: Two times a day (BID) | SUBCUTANEOUS | Status: DC
  Administered 2013-03-08 – 2013-03-10 (×5): 60 mg via SUBCUTANEOUS
  Filled 2013-03-08 (×6): qty 0.6

## 2013-03-08 MED ORDER — ACETAMINOPHEN 325 MG PO TABS: 650.0000 mg | ORAL_TABLET | Freq: Four times a day (QID) | ORAL | Status: DC | PRN

## 2013-03-08 MED ORDER — ACETAMINOPHEN 650 MG RE SUPP: 650.0000 mg | Freq: Four times a day (QID) | RECTAL | Status: DC | PRN

## 2013-03-08 MED ORDER — OXYCODONE HCL 5 MG PO TABS
5.0000 mg | ORAL_TABLET | ORAL | Status: DC | PRN
  Administered 2013-03-10: 5 mg via ORAL
  Filled 2013-03-08: qty 1

## 2013-03-08 MED ORDER — WARFARIN VIDEO: Freq: Once | Status: DC

## 2013-03-08 MED ORDER — COUMADIN BOOK
Freq: Once | Status: DC
  Filled 2013-03-08: qty 1

## 2013-03-08 MED ORDER — MORPHINE SULFATE 2 MG/ML IJ SOLN: 1.0000 mg | INTRAMUSCULAR | Status: DC | PRN

## 2013-03-08 MED ORDER — SODIUM CHLORIDE 0.9 % IJ SOLN
3.0000 mL | Freq: Two times a day (BID) | INTRAMUSCULAR | Status: DC
  Administered 2013-03-09: 3 mL via INTRAVENOUS

## 2013-03-08 MED ORDER — SODIUM CHLORIDE 0.9 % IV SOLN: 250.0000 mL | INTRAVENOUS | Status: DC | PRN

## 2013-03-08 MED ORDER — ONDANSETRON HCL 4 MG PO TABS
4.0000 mg | ORAL_TABLET | Freq: Four times a day (QID) | ORAL | Status: DC | PRN
  Administered 2013-03-10: 4 mg via ORAL
  Filled 2013-03-08: qty 1

## 2013-03-08 MED ORDER — LORAZEPAM 2 MG/ML IJ SOLN
1.0000 mg | Freq: Once | INTRAMUSCULAR | Status: AC
  Administered 2013-03-08: 1 mg via INTRAVENOUS
  Filled 2013-03-08: qty 1

## 2013-03-08 MED ORDER — WARFARIN SODIUM 5 MG PO TABS
5.0000 mg | ORAL_TABLET | Freq: Once | ORAL | Status: AC
  Administered 2013-03-08: 5 mg via ORAL
  Filled 2013-03-08: qty 1

## 2013-03-08 MED ORDER — ONDANSETRON HCL 4 MG/2ML IJ SOLN
4.0000 mg | Freq: Four times a day (QID) | INTRAMUSCULAR | Status: DC | PRN
  Administered 2013-03-09: 4 mg via INTRAVENOUS
  Filled 2013-03-08: qty 2

## 2013-03-08 NOTE — Progress Notes (Addendum)
[facility] PROGRESS NOTE Assessment/Plan: *Acute pulmonary embolism/  DVT of upper extremity (deep vein thrombosis): - Failed Xarelto. - Start lovenox and coumadin. - Discuss case with hematology will need lovenox for 1 5-7 days, unitl INR therapeutic. - will need hematology follow up. - hypercoagulable panel pending.  Acute anxiety - very anxious. - cont home meds.    DVT Prophylaxis: As above  Code Status: Full code  Family Communication: Discussed with the patient  Disposition Plan: inpatient    Consultants:  none  Procedures:  Ct angio  Antibiotics:  none (indicate start date, and stop date if known)  HPI/Subjective: Very anxious, labile emotion  Objective: Filed Vitals:   03/08/13 0515 03/08/13 0529 03/08/13 0600 03/08/13 0630  BP:  109/56 111/64   Pulse: 91 94 101 92  Temp:      TempSrc:      Resp: 15 15 20 17   Height:      Weight:      SpO2:  99%     No intake or output data in the 24 hours ending 03/08/13 0733 Filed Weights   03/08/13 0155  Weight: 63.05 kg (139 lb)    Exam:  General: Alert, awake, oriented x3, in no acute distress.  HEENT: No bruits, no goiter.  Heart: Regular rate and rhythm, without murmurs, rubs, gallops.  Lungs: Good air movement, clear  Abdomen: Soft, nontender, nondistended, positive bowel sounds.     Data Reviewed: Basic Metabolic Panel:  Recent Labs Lab 03/08/13 0241  NA 140  K 3.6*  CL 102  CO2 26  GLUCOSE 108*  BUN 11  CREATININE 0.77  CALCIUM 9.2   Liver Function Tests: No results found for this basename: AST, ALT, ALKPHOS, BILITOT, PROT, ALBUMIN,  in the last 168 hours No results found for this basename: LIPASE, AMYLASE,  in the last 168 hours No results found for this basename: AMMONIA,  in the last 168 hours CBC:  Recent Labs Lab 03/08/13 0241  WBC 8.8  NEUTROABS 5.7  HGB 10.3*  HCT 33.0*  MCV 76.6*  PLT 215   Cardiac Enzymes:  Recent Labs Lab 03/08/13 0241  03/08/13 0519  TROPONINI <0.30 <0.30   BNP (last 3 results) No results found for this basename: PROBNP,  in the last 8760 hours CBG: No results found for this basename: GLUCAP,  in the last 168 hours  No results found for this or any previous visit (from the past 240 hour(s)).   Studies: Ct Angio Chest Pe W/cm &/or Wo Cm  03/08/2013   CLINICAL DATA:  Chest pain.  History of left arm blood clot.  EXAM: CT ANGIOGRAPHY CHEST WITH CONTRAST  TECHNIQUE: Multidetector CT imaging of the chest was performed using the standard protocol during bolus administration of intravenous contrast. Multiplanar CT image reconstructions and MIPs were obtained to evaluate the vascular anatomy.  CONTRAST:  178mL OMNIPAQUE IOHEXOL 350 MG/ML SOLN  COMPARISON:  Coronary CTA 11/26/2007  FINDINGS: THORACIC INLET/BODY WALL:  No acute abnormality.  MEDIASTINUM:  Normal heart size.  No pericardial effusion.  There is a solitary pulmonary arterial filling defect within a sub segmental anterior right upper lobe branch. No acute systemic arterial abnormality. No adenopathy.  LUNG WINDOWS:  No consolidation.  No effusion.  No suspicious pulmonary nodule.  UPPER ABDOMEN:  No acute findings.  OSSEOUS:  No acute fracture.  No suspicious lytic or blastic lesions.  Critical Value/emergent results were called by telephone at the time of interpretation on 03/08/2013 at 5:26  AM to Dr. Ripley Fraise , who verbally acknowledged these results.  Review of the MIP images confirms the above findings.  IMPRESSION: Solitary subsegmental pulmonary embolism to the right upper lobe.   Electronically Signed   By: Jorje Guild M.D.   On: 03/08/2013 05:26    Scheduled Meds: . enoxaparin (LOVENOX) injection  60 mg Subcutaneous Q12H   Continuous Infusions:    Charlynne Cousins  [facility] Pager [phone]. If 8PM-8AM, please contact night-coverage at [email or web address], password [facility] 03/08/2013, 7:33 AM  LOS: 0 days

## 2013-03-08 NOTE — ED Notes (Signed)
Pt c/o chest pain since 2200, pt states she was recently diagnosed with blood clot in arm .

## 2013-03-08 NOTE — ED Provider Notes (Signed)
CSN: [id number]     Arrival date & time 03/08/13  0147 History   First MD Initiated Contact with Patient 03/08/13 [phone]     Chief Complaint  Patient presents with  . Chest Pain      Patient is a 43 y.o. female presenting with chest pain. The history is provided by the patient.  Chest Pain Pain location:  Substernal area Pain quality: pressure and sharp   Pain radiates to:  Does not radiate Pain radiates to the back: no   Pain severity:  Moderate Onset quality:  Sudden Duration: 6 hours ago. Timing:  Constant Progression:  Resolved Chronicity:  New Relieved by:  None tried Worsened by:  Nothing tried Associated symptoms: shortness of breath   Associated symptoms: no fever, no syncope and not vomiting   pt with recent diagnosis of DVT in left UE (had recent surgery and DVT thought to be caused by IV) She is on xarelto without missing doses Earlier tonight, she noticed onset of CP.  She reports it is worse with deep breathing No syncope +SOB   She reports pain is improved She denies known h/o CAD  Past Medical History  Diagnosis Date  . Normal cardiac stress test 2009    normal coronary CT scan   Past Surgical History  Procedure Laterality Date  . Wisdom tooth extraction    . Parotid gland tumor excision     History reviewed. No pertinent family history. History  Substance Use Topics  . Smoking status: Never Smoker   . Smokeless tobacco: Not on file  . Alcohol Use: No   OB History   Grav Para Term Preterm Abortions TAB SAB Ect Mult Living                 Review of Systems  Constitutional: Negative for fever.  Respiratory: Positive for shortness of breath.   Cardiovascular: Positive for chest pain. Negative for syncope.  Gastrointestinal: Negative for vomiting and blood in stool.  Genitourinary: Negative for vaginal bleeding.  Neurological: Negative for syncope.  Psychiatric/Behavioral: The patient is nervous/anxious.   All other systems reviewed and are  negative.      Allergies  Amoxicillin  Home Medications   Current Outpatient Rx  Name  Route  Sig  Dispense  Refill  . acetaminophen (TYLENOL CHILDRENS) 160 MG/5ML suspension   Oral   Take 480 mg by mouth every 6 (six) hours as needed for moderate pain.         Marland Kitchen acetaminophen-codeine (TYLENOL #3) 300-30 MG per tablet   Oral   Take 1 tablet by mouth every 4 (four) hours as needed for moderate pain or severe pain.         . diazepam (VALIUM) 2 MG tablet   Oral   Take 2 mg by mouth every 8 (eight) hours as needed for anxiety.         . Rivaroxaban (XARELTO) 15 MG TABS tablet   Oral   Take 1 tablet (15 mg total) by mouth 2 (two) times daily.   42 tablet   0    BP 120/69  Pulse 91  Temp(Src) 98 F (36.7 C) (Oral)  Resp 17  Ht 5\' 3"  (1.6 m)  Wt 139 lb (63.05 kg)  BMI 24.63 kg/m2  SpO2 100%  LMP 02/24/2013 Physical Exam CONSTITUTIONAL: Well developed/well nourished HEAD: Normocephalic/atraumatic EYES: EOMI/PERRL ENMT: Mucous membranes moist NECK: supple no meningeal signs SPINE:entire spine nontender CV: S1/S2 noted, no murmurs/rubs/gallops noted LUNGS: Lungs are  clear to auscultation bilaterally, no apparent distress ABDOMEN: soft, nontender, no rebound or guarding GU:no cva tenderness NEURO: Pt is awake/alert, moves all extremitiesx4 EXTREMITIES: pulses normal, full ROM, distal pulses intact, no significant erythema to left UE SKIN: warm, color normal PSYCH: anxious  ED Course  Procedures   4:19 AM Pt with recent diagnosis of DVT now with pleuritic CP Low risk for ACS  Will obtain CT chest to r/o PE Pt also reports significant anxiety with her recent illnesses Will follow closely 5:37 AM Pt reports parotid tumor was benign CT chest reveals PE D/w dr [place] Pink will evaluate for admission Labs Review Labs Reviewed  CBC WITH DIFFERENTIAL - Abnormal; Notable for the following:    Hemoglobin 10.3 (*)    HCT 33.0 (*)    MCV 76.6 (*)    MCH 23.9  (*)    RDW 17.2 (*)    All other components within normal limits  BASIC METABOLIC PANEL - Abnormal; Notable for the following:    Potassium 3.6 (*)    Glucose, Bld 108 (*)    All other components within normal limits  TROPONIN I  TROPONIN I   Imaging Review Ct Angio Chest Pe W/cm &/or Wo Cm  03/08/2013   CLINICAL DATA:  Chest pain.  History of left arm blood clot.  EXAM: CT ANGIOGRAPHY CHEST WITH CONTRAST  TECHNIQUE: Multidetector CT imaging of the chest was performed using the standard protocol during bolus administration of intravenous contrast. Multiplanar CT image reconstructions and MIPs were obtained to evaluate the vascular anatomy.  CONTRAST:  112mL OMNIPAQUE IOHEXOL 350 MG/ML SOLN  COMPARISON:  Coronary CTA 11/26/2007  FINDINGS: THORACIC INLET/BODY WALL:  No acute abnormality.  MEDIASTINUM:  Normal heart size.  No pericardial effusion.  There is a solitary pulmonary arterial filling defect within a sub segmental anterior right upper lobe branch. No acute systemic arterial abnormality. No adenopathy.  LUNG WINDOWS:  No consolidation.  No effusion.  No suspicious pulmonary nodule.  UPPER ABDOMEN:  No acute findings.  OSSEOUS:  No acute fracture.  No suspicious lytic or blastic lesions.  Critical Value/emergent results were called by telephone at the time of interpretation on 03/08/2013 at 5:26 AM to Dr. Ripley Fraise , who verbally acknowledged these results.  Review of the MIP images confirms the above findings.  IMPRESSION: Solitary subsegmental pulmonary embolism to the right upper lobe.   Electronically Signed   By: Jorje Guild M.D.   On: 03/08/2013 05:26     EKG Interpretation   Date/Time:  Tuesday March 08 2013 01:57:16 EST Ventricular Rate:  89 PR Interval:  112 QRS Duration: 80 QT Interval:  362 QTC Calculation: 440 R Axis:   63 Text Interpretation:  Normal sinus rhythm Nonspecific ST abnormality  Abnormal ECG No previous ECGs available artifact noted Confirmed by   Christy Gentles  MD, [place] ([id number]) on 03/08/2013 2:43:20 AM      EKG Interpretation  Date/Time:  Tuesday March 08 2013 05:27:09 EST Ventricular Rate:  89 PR Interval:  112 QRS Duration: 82 QT Interval:  370 QTC Calculation: 450 R Axis:   61 Text Interpretation:  Normal sinus rhythm Normal ECG When compared with ECG of 08-Mar-2013 01:57, No significant change was found Confirmed by [place] ([id number]) on 03/08/2013 5:35:52 AM        MDM   Final diagnoses:  Pulmonary embolism    Nursing notes including past medical history and social history reviewed and considered in documentation Labs/vital reviewed and considered  Sharyon Cable, MD 03/08/13 [phone]

## 2013-03-08 NOTE — H&P (Signed)
[facility] History and Physical  [name]:[id number] DOB: [birth year] DOA: 03/08/2013   PCP: Purvis Kilts, MD  Specialists: Dr. Constance Holster is her ENT specialist.  Chief Complaint: Chest pain  HPI: Victoria Stein is a 43 y.o. female with no significant past medical history except that most recently she was diagnosed with a left upper extremity DVT a few days ago. Over the past couple months patient has been experiencing jaw pain. She has been seen by numerous specialists, including dental surgeons and ENT. She had wisdom teeth extraction without relief of pain. 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 She underwent surgery for same in February 2015. The tumor was found to be benign. During that surgical procedure, which was done in an outpatient facility, she had an IV placed in her left arm. And during that stay she noticed that with the IV site was red. The redness spread across the arm to involve part of the upper arm as well. Subsequently, she came in to the emergency department on February 26 and was diagnosed with a DVT of brachial vein in the left. She was started on Xarelto. Patient has been extremely anxious for the last couple of months. Her anxiety level, increased over the last few days after she was diagnosed with a DVT. She started having upper back pain a few days later. She came in to the emergency department. She was reassured and discharged. And, then at 10 PM last night she started having chest pain in the central part of the chest. It felt like a pressure-like sensation. Was 8/10 in intensity. She tried taking her diazepam which was prescribed recently by her primary care physician without any relief. She denied any shortness of breath, but has been having some dizziness. No fever. No chills. No cough. Denies any leg swelling. No previously known history of blood clots. She subsequently decided to come back to the hospital.  Home  Medications: Prior to Admission medications   Medication Sig Start Date End Date Taking? Authorizing Provider  acetaminophen (TYLENOL CHILDRENS) 160 MG/5ML suspension Take 480 mg by mouth every 6 (six) hours as needed for moderate pain.    Historical Provider, MD  acetaminophen-codeine (TYLENOL #3) 300-30 MG per tablet Take 1 tablet by mouth every 4 (four) hours as needed for moderate pain or severe pain.    Historical Provider, MD  diazepam (VALIUM) 2 MG tablet Take 2 mg by mouth every 8 (eight) hours as needed for anxiety.    Historical Provider, MD  Rivaroxaban (XARELTO) 15 MG TABS tablet Take 1 tablet (15 mg total) by mouth 2 (two) times daily. 03/03/13   Orpah Greek, MD    Allergies:  Allergies  Allergen Reactions  . Amoxicillin Swelling    Past Medical History: Past Medical History  Diagnosis Date  . Normal cardiac stress test 2009    normal coronary CT scan    Past Surgical History  Procedure Laterality Date  . Wisdom tooth extraction    . Parotid gland tumor excision      Social History: Patient lives in [place] with family. She is self employed. No smoking or alcohol use. No illicit drug use. Independent with daily activities.  Family History:  Family History  Problem Relation Age of Onset  . Diabetes Mother   . Heart disease Father      Review of Systems - History obtained from the patient General ROS: positive for  - fatigue Psychological  ROS: positive for - anxiety Ophthalmic ROS: negative ENT ROS: negative Allergy and Immunology ROS: negative Hematological and Lymphatic ROS: negative Endocrine ROS: negative Respiratory ROS: as in hpi Cardiovascular ROS: as in hpi Gastrointestinal ROS: no abdominal pain, change in bowel habits, or black or bloody stools Genito-Urinary ROS: no dysuria, trouble voiding, or hematuria Musculoskeletal ROS: negative Neurological ROS: no TIA or stroke symptoms Dermatological ROS: negative  Physical  Examination  Filed Vitals:   03/08/13 0300 03/08/13 0330 03/08/13 0430 03/08/13 0529  BP: 120/69   109/56  Pulse: 91 93 94 94  Temp:      TempSrc:      Resp: _0 Height:      Weight:      SpO2: 100%   99%    BP 109/56  Pulse 94  Temp(Src) 98 F (36.7 C) (Oral)  Resp 15  Ht _1  (1.6 m)  Wt 63.05 kg (139 lb)  BMI 24.63 kg/m2  SpO2 99%  LMP 02/24/2013  General appearance: alert, cooperative, appears stated age and no distress Head: Normocephalic, without obvious abnormality, atraumatic Eyes: conjunctivae/corneas clear. PERRL, EOM's intact. Throat: lips, mucosa, and tongue normal; teeth and gums normal Neck: no adenopathy, no carotid bruit, no JVD, supple, symmetrical, trachea midline and thyroid not enlarged, symmetric, no tenderness/mass/nodules Resp: clear to auscultation bilaterally Cardio: regular rate and rhythm, S1, S2 normal, no murmur, click, rub or gallop GI: soft, non-tender; bowel sounds normal; no masses,  no organomegaly Extremities: extremities normal, atraumatic, no cyanosis or edema Pulses: 2+ and symmetric Skin: Skin color, texture, turgor normal. No rashes or lesions Lymph nodes: Cervical, supraclavicular, and axillary nodes normal. Neurologic: Alert and oriented X 3, normal strength and tone. Normal symmetric reflexes. Normal coordination and gait  Laboratory Data: Results for orders placed during the hospital encounter of 03/08/13 (from the past 48 hour(s))  TROPONIN I     Status: None   Collection Time    03/08/13  2:41 AM      Result Value Ref Range   Troponin I <0.30  <0.30 ng/mL   Comment:            Due to the release kinetics of cTnI,     a negative result within the first hours     of the onset of symptoms does not rule out     myocardial infarction with certainty.     If myocardial infarction is still suspected,     repeat the test at appropriate intervals.  CBC WITH DIFFERENTIAL     Status: Abnormal   Collection Time     03/08/13  2:41 AM      Result Value Ref Range   WBC 8.8  4.0 - 10.5 K/uL   RBC 4.31  3.87 - 5.11 MIL/uL   Hemoglobin 10.3 (*) 12.0 - 15.0 g/dL   HCT 33.0 (*) 36.0 - 46.0 %   MCV 76.6 (*) 78.0 - 100.0 fL   MCH 23.9 (*) 26.0 - 34.0 pg   MCHC 31.2  30.0 - 36.0 g/dL   RDW 17.2 (*) 11.5 - 15.5 %   Platelets 215  150 - 400 K/uL   Neutrophils Relative % 64  43 - 77 %   Neutro Abs 5.7  1.7 - 7.7 K/uL   Lymphocytes Relative 26  12 - 46 %   Lymphs Abs 2.3  0.7 - 4.0 K/uL   Monocytes Relative 9  3 - 12 %   Monocytes Absolute 0.8  0.1 - 1.0 K/uL   Eosinophils Relative 1  0 - 5 %   Eosinophils Absolute 0.1  0.0 - 0.7 K/uL   Basophils Relative 0  0 - 1 %   Basophils Absolute 0.0  0.0 - 0.1 K/uL  BASIC METABOLIC PANEL     Status: Abnormal   Collection Time    03/08/13  2:41 AM      Result Value Ref Range   Sodium 140  137 - 147 mEq/L   Potassium 3.6 (*) 3.7 - 5.3 mEq/L   Chloride 102  96 - 112 mEq/L   CO2 26  19 - 32 mEq/L   Glucose, Bld 108 (*) 70 - 99 mg/dL   BUN 11  6 - 23 mg/dL   Creatinine, Ser 0.77  0.50 - 1.10 mg/dL   Calcium 9.2  8.4 - 10.5 mg/dL   GFR calc non Af Amer >90  >90 mL/min   GFR calc Af Amer >90  >90 mL/min   Comment: (NOTE)     The eGFR has been calculated using the CKD EPI equation.     This calculation has not been validated in all clinical situations.     eGFR's persistently <90 mL/min signify possible Chronic Kidney     Disease.  TROPONIN I     Status: None   Collection Time    03/08/13  5:19 AM      Result Value Ref Range   Troponin I <0.30  <0.30 ng/mL   Comment:            Due to the release kinetics of cTnI,     a negative result within the first hours     of the onset of symptoms does not rule out     myocardial infarction with certainty.     If myocardial infarction is still suspected,     repeat the test at appropriate intervals.    Radiology Reports: Ct Angio Chest Pe W/cm &/or Wo Cm  03/08/2013   CLINICAL DATA:  Chest pain.  History of left  arm blood clot.  EXAM: CT ANGIOGRAPHY CHEST WITH CONTRAST  TECHNIQUE: Multidetector CT imaging of the chest was performed using the standard protocol during bolus administration of intravenous contrast. Multiplanar CT image reconstructions and MIPs were obtained to evaluate the vascular anatomy.  CONTRAST:  164m OMNIPAQUE IOHEXOL 350 MG/ML SOLN  COMPARISON:  Coronary CTA 11/26/2007  FINDINGS: THORACIC INLET/BODY WALL:  No acute abnormality.  MEDIASTINUM:  Normal heart size.  No pericardial effusion.  There is a solitary pulmonary arterial filling defect within a sub segmental anterior right upper lobe branch. No acute systemic arterial abnormality. No adenopathy.  LUNG WINDOWS:  No consolidation.  No effusion.  No suspicious pulmonary nodule.  UPPER ABDOMEN:  No acute findings.  OSSEOUS:  No acute fracture.  No suspicious lytic or blastic lesions.  Critical Value/emergent results were called by telephone at the time of interpretation on 03/08/2013 at 5:26 AM to Dr. DRipley Fraise, who verbally acknowledged these results.  Review of the MIP images confirms the above findings.  IMPRESSION: Solitary subsegmental pulmonary embolism to the right upper lobe.   Electronically Signed   By: JJorje GuildM.D.   On: 03/08/2013 05:26    Electrocardiogram: Sinus rhythm at 89 beats per minute. Normal axis. Intervals are normal. No Q waves. No concerning ST or T-wave changes noted.  Problem List  Principal Problem:   Acute pulmonary embolism Active Problems:   DVT of upper  extremity (deep vein thrombosis)   Acute anxiety   Assessment: This is an unfortunate 43 year old, Caucasian female, who presents with chest pain, and is found to have subsegmental pulmonary embolism in the right upper lobe. It remains unclear if the chest pain is due to the PE or due to her anxiety. I think anxiety is playing a much bigger role. The other question to answer is if the PE reflects a failure of Xarelto.  Plan: #1 acute  pulmonary embolism with recently diagnosed upper extremity DVT: This is a complicated situation. It is unclear if this will be considered failure of Xarelto. It also remains unclear if the chest pain is all because of PE or, if it's due to anxiety. I would think it more due to her anxious state. EKG is nonischemic. Troponin is normal. At this time I am going to consult hematology to assist with management and come up with a reasonable treatment strategy. In the meantime we'll place her on Lovenox. Her last dose of Xarelto was at 9PM last night. Pharmacy to assist with the dosing of Lovenox. And, then we will await hematology input.  #2 Parotid gland tumor status post resection in February of this year: This is a benign tumor based on surgical pathology reports.  #3 acute anxiety: Will be treated with Xanax for now. Hold her diazepam as it hasn't helped.   DVT Prophylaxis: As above Code Status: Full code Family Communication: Discussed with the patient  Disposition Plan: Observe to telemetry   Further management decisions will depend on results of further testing and patient's response to treatment.  [facility]  [facility] Pager [phone]  If 7PM-7AM, please contact night-coverage [email or web address] Password [facility]  03/08/2013, 6:34 AM

## 2013-03-08 NOTE — ED Notes (Signed)
Pt. Refusing IV at this time.

## 2013-03-08 NOTE — Progress Notes (Addendum)
ANTICOAGULATION CONSULT NOTE - Initial Consult  Pharmacy Consult for Lovenox >> Warfarin Indication: pulmonary embolus  Allergies  Allergen Reactions  . Amoxicillin Swelling    Patient Measurements: Height: 5\' 3"  (160 cm) Weight: 137 lb 5.6 oz (62.3 kg) IBW/kg (Calculated) : 52.4  Vital Signs: Temp: 98.2 F (36.8 C) (03/03 0853) Temp src: Oral (03/03 0853) BP: 105/58 mmHg (03/03 0853) Pulse Rate: 110 (03/03 0819)  Labs:  Recent Labs  03/08/13 0241 03/08/13 0519 03/08/13 0739  HGB 10.3*  --   --   HCT 33.0*  --   --   PLT 215  --   --   LABPROT  --   --  25.2*  INR  --   --  2.38*  CREATININE 0.77  --   --   TROPONINI <0.30 <0.30  --     Estimated Creatinine Clearance: 75 ml/min (by C-G formula based on Cr of 0.77).   Medical History: Past Medical History  Diagnosis Date  . Normal cardiac stress test 2009    normal coronary CT scan    Medications:  Prescriptions prior to admission  Medication Sig Dispense Refill  . acetaminophen (TYLENOL CHILDRENS) 160 MG/5ML suspension Take 480 mg by mouth every 6 (six) hours as needed for moderate pain.      Marland Kitchen acetaminophen-codeine (TYLENOL #3) 300-30 MG per tablet Take 1 tablet by mouth every 4 (four) hours as needed for moderate pain or severe pain.      . diazepam (VALIUM) 2 MG tablet Take 2 mg by mouth every 8 (eight) hours as needed for anxiety.       Scheduled:   Assessment: 43 yo F who was diagnosed with DVT on 03/03/13 and started on Xarelto.  She has returned with chest pain thought to be related to her anxiety.  Chest CT + PE.  O2 sats have been 100% on RA.  Work-up for PE not previously done so unknown if this is new event and thus Xarelto treatment failure.  Last Xarelto dose was 3/2 at 9pm. No bleeding noted.  Renal function is normal. Awaiting hematology input for long-term treatment recommendations.     Goal of Therapy:  Anti-Xa level 0.6-1 units/ml 4hrs after LMWH dose given Monitor platelets by  anticoagulation protocol: Yes INR Goal 2-3  Plan:  Lovenox 60mg  sq q12h Monitor CBC & renal function Follow-up long-term AC plans.  Pricilla Larsson 03/08/2013,9:03 AM  Primary MD starting warfarin pending heme consult. Baseline INR ordered.  Will f/u for warfarin dose.   Netta Cedars, PharmD, BCPS 03/08/2013@9 :03 AM  Addendum: Baseline INR noted.  Last Xarelto dose given @ 9PM on 3/2. Warfarin 5mg  PO x 1 this evening @ 9pm. Daily PT/INR. Product/process development scientist. F/U Heme recommendations.  Pricilla Larsson, [facility] 03/08/2013 9:10 AM

## 2013-03-08 NOTE — ED Notes (Signed)
Assisted pt to restroom  

## 2013-03-08 NOTE — Consult Note (Signed)
[facility] Consultation Oncology  Name: LIAM BOSSMAN      MRN: [id number]    Location: IC08/IC08-01  Date: 03/08/2013 Time:11:31 AM   REFERRING PHYSICIAN:  Marguarite Arbour, MD  REASON FOR CONSULT:  Acute PE   DIAGNOSIS:  Acute PE in the setting of a DVT of left brachial vein on 03/03/2013  HISTORY OF PRESENT ILLNESS:   Ed Rayson is a pleasant, yet anxious, 43 year old caucasian female who presented to the ED on 03/08/2013 with sharp chest pain and pressure.   CT Angio of chest was performed demonstrating an acute PE.   Over the past couple months patient had been experiencing jaw pain. She has been seen by numerous specialists, including dental surgeons and ENT. She had wisdom teeth extraction without relief of pain. Subsequently, she was found to have a tumor in her right parotid gland by MRI imaging. She underwent surgery on February 23, 2013 by Dr. Constance Holster and the tumor was found to be benign. During that surgical procedure, which was done in an outpatient facility, she had an IV placed in her left arm. During that stay she noticed that with the IV site was red. Following discharge, the redness spread across the arm to involve part of the upper arm as well.  She reported this to the surgeon and staff member who recommended elevation of the left arm and warm compresses, but the symptoms and signs progressed.  She was therefore referred to the ED on 03/03/2013 and was diagnosed with a DVT of brachial vein in the left. She was started on Xarelto 15 mg BID x 21 days with transition to 20 mg daily.   On 03/07/2013, she came in to the emergency department with chest pressure. She was reassured and discharged from the ED in light of her anxiety.  And, then at 10 PM last night (3/2)  she started having chest pain in the central part of the chest. It felt like a pressure-like sensation. It was an 8/10 in intensity. She tried taking her diazepam which was prescribed recently by her primary care physician  without any relief.  She awoke at 1 AM on 03/08/13 with severe chest pain and was brought to the ED by her husband.  She notes that her left upper arm has improved.  She admits to 100% compliance with Xarelto which I absolutely believe given her anxious mentality.  She has 2 children, 72 and 31 years old.  She denies any miscarriages in the past.  She denies a family history of blood clots or malignancies.    She is very anxious regarding her diagnosis and she has a lot of questions regarding DVTs and PEs.  She is worried about death.  Other than anxiety and concern, she denies any complaints and ROS questioning is negative.   PAST MEDICAL HISTORY:   Past Medical History  Diagnosis Date  . Normal cardiac stress test 2009    normal coronary CT scan    ALLERGIES: Allergies  Allergen Reactions  . Amoxicillin Swelling      MEDICATIONS: I have reviewed the patient's current medications.     PAST SURGICAL HISTORY Past Surgical History  Procedure Laterality Date  . Wisdom tooth extraction    . Parotid gland tumor excision      FAMILY HISTORY: Family History  Problem Relation Age of Onset  . Diabetes Mother   . Heart disease Father     SOCIAL HISTORY:  reports that she has never smoked.  She does not have any smokeless tobacco history on file. She reports that she does not drink alcohol or use illicit drugs.  PERFORMANCE STATUS: The patient's performance status is 1 - Symptomatic but completely ambulatory  PHYSICAL EXAM: Most Recent Vital Signs: Blood pressure 105/58, pulse 110, temperature 98.2 F (36.8 C), temperature source Oral, resp. rate 18, height _0  (1.6 m), weight 137 lb 5.6 oz (62.3 kg), last menstrual period 02/24/2013, SpO2 100.00%. General appearance: alert, cooperative, appears stated age, no distress and anxious Head: Normocephalic, without obvious abnormality, atraumatic Eyes: negative findings: lids and lashes normal, conjunctivae and sclerae normal, corneas  clear and pupils equal, round, reactive to light and accomodation Neck: supple, symmetrical, trachea midline Back: symmetric, no curvature. ROM normal. No CVA tenderness. Lungs: diminished breath sounds RLL Heart: regular rate and rhythm, S1, S2 normal, no murmur, click, rub or gallop Abdomen: soft, non-tender; bowel sounds normal; no masses,  no organomegaly Extremities: extremities normal, atraumatic, no cyanosis or edema Skin: Skin color, texture, turgor normal. No rashes or lesions Neurologic: Grossly normal  LABORATORY DATA:  Results for orders placed during the hospital encounter of 03/08/13 (from the past 48 hour(s))  TROPONIN I     Status: None   Collection Time    03/08/13  2:41 AM      Result Value Ref Range   Troponin I <0.30  <0.30 ng/mL   Comment:            Due to the release kinetics of cTnI,     a negative result within the first hours     of the onset of symptoms does not rule out     myocardial infarction with certainty.     If myocardial infarction is still suspected,     repeat the test at appropriate intervals.  CBC WITH DIFFERENTIAL     Status: Abnormal   Collection Time    03/08/13  2:41 AM      Result Value Ref Range   WBC 8.8  4.0 - 10.5 K/uL   RBC 4.31  3.87 - 5.11 MIL/uL   Hemoglobin 10.3 (*) 12.0 - 15.0 g/dL   HCT 33.0 (*) 36.0 - 46.0 %   MCV 76.6 (*) 78.0 - 100.0 fL   MCH 23.9 (*) 26.0 - 34.0 pg   MCHC 31.2  30.0 - 36.0 g/dL   RDW 17.2 (*) 11.5 - 15.5 %   Platelets 215  150 - 400 K/uL   Neutrophils Relative % 64  43 - 77 %   Neutro Abs 5.7  1.7 - 7.7 K/uL   Lymphocytes Relative 26  12 - 46 %   Lymphs Abs 2.3  0.7 - 4.0 K/uL   Monocytes Relative 9  3 - 12 %   Monocytes Absolute 0.8  0.1 - 1.0 K/uL   Eosinophils Relative 1  0 - 5 %   Eosinophils Absolute 0.1  0.0 - 0.7 K/uL   Basophils Relative 0  0 - 1 %   Basophils Absolute 0.0  0.0 - 0.1 K/uL  BASIC METABOLIC PANEL     Status: Abnormal   Collection Time    03/08/13  2:41 AM      Result  Value Ref Range   Sodium 140  137 - 147 mEq/L   Potassium 3.6 (*) 3.7 - 5.3 mEq/L   Chloride 102  96 - 112 mEq/L   CO2 26  19 - 32 mEq/L   Glucose, Bld 108 (*) 70 - 99 mg/dL  BUN 11  6 - 23 mg/dL   Creatinine, Ser 0.77  0.50 - 1.10 mg/dL   Calcium 9.2  8.4 - 10.5 mg/dL   GFR calc non Af Amer >90  >90 mL/min   GFR calc Af Amer >90  >90 mL/min   Comment: (NOTE)     The eGFR has been calculated using the CKD EPI equation.     This calculation has not been validated in all clinical situations.     eGFR's persistently <90 mL/min signify possible Chronic Kidney     Disease.  TROPONIN I     Status: None   Collection Time    03/08/13  5:19 AM      Result Value Ref Range   Troponin I <0.30  <0.30 ng/mL   Comment:            Due to the release kinetics of cTnI,     a negative result within the first hours     of the onset of symptoms does not rule out     myocardial infarction with certainty.     If myocardial infarction is still suspected,     repeat the test at appropriate intervals.  PROTIME-INR     Status: Abnormal   Collection Time    03/08/13  7:39 AM      Result Value Ref Range   Prothrombin Time 25.2 (*) 11.6 - 15.2 seconds   INR 2.38 (*) 0.00 - 1.49      RADIOGRAPHY: Ct Angio Chest Pe W/cm &/or Wo Cm  03/08/2013   CLINICAL DATA:  Chest pain.  History of left arm blood clot.  EXAM: CT ANGIOGRAPHY CHEST WITH CONTRAST  TECHNIQUE: Multidetector CT imaging of the chest was performed using the standard protocol during bolus administration of intravenous contrast. Multiplanar CT image reconstructions and MIPs were obtained to evaluate the vascular anatomy.  CONTRAST:  165m OMNIPAQUE IOHEXOL 350 MG/ML SOLN  COMPARISON:  Coronary CTA 11/26/2007  FINDINGS: THORACIC INLET/BODY WALL:  No acute abnormality.  MEDIASTINUM:  Normal heart size.  No pericardial effusion.  There is a solitary pulmonary arterial filling defect within a sub segmental anterior right upper lobe branch. No acute  systemic arterial abnormality. No adenopathy.  LUNG WINDOWS:  No consolidation.  No effusion.  No suspicious pulmonary nodule.  UPPER ABDOMEN:  No acute findings.  OSSEOUS:  No acute fracture.  No suspicious lytic or blastic lesions.  Critical Value/emergent results were called by telephone at the time of interpretation on 03/08/2013 at 5:26 AM to Dr. DRipley Fraise, who verbally acknowledged these results.  Review of the MIP images confirms the above findings.  IMPRESSION: Solitary subsegmental pulmonary embolism to the right upper lobe.   Electronically Signed   By: JJorje GuildM.D.   On: 03/08/2013 05:26       ASSESSMENT:  1. Subsegmental right upper lobe PE 2. Left brachial vein DVT diagnosed on 03/03/2013.  Suspect the inciting factor was left lateral wrist IV needle for surgery +/- surgery. 3. ?Xarelto failure? 4. Right parotid mass, S/P resection on 02/23/2013 by Dr. RConstance Holsterwith pathology demonstrating benign salivary tissue with pleomorphic adenoma and benign lymph nodes. 5. Anxiety with history of panic attacks, on Valium by PCP   PLAN:  1. I personally reviewed and went over laboratory results with the patient.  The results are noted within this dictation. 2. I personally reviewed and went over radiographic studies with the patient.  The results are noted within this dictation.  3. Hypercoag panel is ordered and pending 4. D-Dimer today 5. B/L LE Doppler US 6. Left UE doppler US 7. 2D echo to evaluate for vegetation 8. Recommend Lovenox 1 mg/kg twice daily and continue as an outpatient 9. It is difficult to say that this patient has experienced a Xarelto failure as we are not sure if the PE has been there all along and she only recently became symptomatic, which could be the case.  With this being said, it would behoove [place] to switch her to Lovenox (and assume Xarelto failure) to ensure adequate dosing and absorption.  Lovenox and Xarelto have similar mechanisms of actions but Lovenox  will be absorbed appropriately, whereas the question of Xarelto absorption is always in questions without a Factor Xa level (which is extremely expensive testing).   The suspected inciting factor of her VTE is from insertion of a foreign object (IV in left lateral wrist at time of surgery) and surgery itself.    10. Patient education regarding VTE 11. We recommend 6 months worth of anticoagulation. 12. Will follow as an inpatient. 13.  Outpatient appointment 1-2 weeks following discharge.  All questions were answered. The patient knows to call the clinic with any problems, questions or concerns. We can certainly see the patient much sooner if necessary.  Patient and plan discussed with Dr. Farrel Gobble and he is in agreement with the aforementioned.    KEFALAS,THOMAS

## 2013-03-08 NOTE — ED Notes (Signed)
Pt placed on cardiac monitor. Pt refusing IV at present. States she got the blood clot in her arm from a previous IV. Repeating "I am just scared. I am scared putting an IV in will move the blood clot. I think it is in my lungs." Pt reports taking her anti-anxiety medication pta.

## 2013-03-08 NOTE — ED Notes (Signed)
MD at bedside. 

## 2013-03-09 ENCOUNTER — Encounter (HOSPITAL_COMMUNITY): Payer: Self-pay | Admitting: Internal Medicine

## 2013-03-09 DIAGNOSIS — I369 Nonrheumatic tricuspid valve disorder, unspecified: Secondary | ICD-10-CM

## 2013-03-09 DIAGNOSIS — D500 Iron deficiency anemia secondary to blood loss (chronic): Secondary | ICD-10-CM | POA: Diagnosis present

## 2013-03-09 DIAGNOSIS — D509 Iron deficiency anemia, unspecified: Secondary | ICD-10-CM

## 2013-03-09 HISTORY — DX: Iron deficiency anemia, unspecified: D50.9

## 2013-03-09 LAB — LUPUS ANTICOAGULANT PANEL
DRVVT: 44.4 s — AB (ref <42.9)
Lupus Anticoagulant: NOT DETECTED
PTT Lupus Anticoagulant: 39.9 secs (ref 28.0–43.0)
dRVVT Incubated 1:1 Mix: 37.9 secs (ref <42.9)

## 2013-03-09 LAB — COMPREHENSIVE METABOLIC PANEL
ALBUMIN: 3.6 g/dL (ref 3.5–5.2)
ALT: 9 U/L (ref 0–35)
AST: 14 U/L (ref 0–37)
Alkaline Phosphatase: 68 U/L (ref 39–117)
BUN: 7 mg/dL (ref 6–23)
CHLORIDE: 104 meq/L (ref 96–112)
CO2: 26 meq/L (ref 19–32)
CREATININE: 0.76 mg/dL (ref 0.50–1.10)
Calcium: 9.2 mg/dL (ref 8.4–10.5)
GFR calc Af Amer: >90 mL/min (ref >90)
Glucose, Bld: 86 mg/dL (ref 70–99)
Potassium: 4.2 mEq/L (ref 3.7–5.3)
Sodium: 141 mEq/L (ref 137–147)
Total Bilirubin: 0.3 mg/dL (ref 0.3–1.2)
Total Protein: 6.9 g/dL (ref 6.0–8.3)

## 2013-03-09 LAB — BETA-2-GLYCOPROTEIN I ABS, IGG/M/A
BETA-2-GLYCOPROTEIN I IGM: 13 M Units (ref <20)
Beta-2 Glyco I IgG: 3 G Units (ref <20)
Beta-2-Glycoprotein I IgA: 21 A Units — ABNORMAL HIGH (ref <20)

## 2013-03-09 LAB — PROTEIN S ACTIVITY: Protein S Activity: 95 % (ref 69–129)

## 2013-03-09 LAB — CBC
HCT: 34.4 % — ABNORMAL LOW (ref 36.0–46.0)
Hemoglobin: 10.9 g/dL — ABNORMAL LOW (ref 12.0–15.0)
MCH: 24.4 pg — AB (ref 26.0–34.0)
MCHC: 31.7 g/dL (ref 30.0–36.0)
MCV: 77 fL — ABNORMAL LOW (ref 78.0–100.0)
PLATELETS: 224 10*3/uL (ref 150–400)
RBC: 4.47 MIL/uL (ref 3.87–5.11)
RDW: 17.6 % — ABNORMAL HIGH (ref 11.5–15.5)
WBC: 9.1 10*3/uL (ref 4.0–10.5)

## 2013-03-09 LAB — PROTEIN C ACTIVITY: PROTEIN C ACTIVITY: 138 % — AB (ref 75–133)

## 2013-03-09 LAB — IRON AND TIBC
Iron: 27 ug/dL — ABNORMAL LOW (ref 42–135)
Saturation Ratios: 6 % — ABNORMAL LOW (ref 20–55)
TIBC: 439 ug/dL (ref 250–470)
UIBC: 412 ug/dL — ABNORMAL HIGH (ref 125–400)

## 2013-03-09 LAB — FERRITIN: Ferritin: 8 ng/mL — ABNORMAL LOW (ref 10–291)

## 2013-03-09 LAB — PROTEIN C, TOTAL: Protein C, Total: 101 % (ref 72–160)

## 2013-03-09 LAB — PROTIME-INR
INR: 1.2 (ref 0.00–1.49)
Prothrombin Time: 14.9 seconds (ref 11.6–15.2)

## 2013-03-09 LAB — CARDIOLIPIN ANTIBODIES, IGG, IGM, IGA
ANTICARDIOLIPIN IGM: 22 [MPL'U]/mL — AB (ref <11)
Anticardiolipin IgA: 4 APL U/mL — ABNORMAL LOW (ref <22)
Anticardiolipin IgG: 21 GPL U/mL (ref <23)

## 2013-03-09 LAB — PROTHROMBIN GENE MUTATION

## 2013-03-09 LAB — FACTOR 5 LEIDEN

## 2013-03-09 LAB — PROTEIN S, TOTAL: Protein S Ag, Total: 96 % (ref 60–150)

## 2013-03-09 MED ORDER — ALPRAZOLAM 0.5 MG PO TABS
0.5000 mg | ORAL_TABLET | Freq: Four times a day (QID) | ORAL | Status: DC | PRN
  Administered 2013-03-09 – 2013-03-10 (×3): 0.5 mg via ORAL
  Filled 2013-03-09 (×3): qty 1

## 2013-03-09 MED ORDER — WARFARIN - PHARMACIST DOSING INPATIENT: Status: DC

## 2013-03-09 MED ORDER — ENOXAPARIN (LOVENOX) PATIENT EDUCATION KIT
PACK | Freq: Once | Status: AC
  Administered 2013-03-09: 15:00:00
  Filled 2013-03-09: qty 1

## 2013-03-09 MED ORDER — DIAZEPAM 2 MG PO TABS: 2.0000 mg | ORAL_TABLET | Freq: Four times a day (QID) | ORAL | Status: DC | PRN

## 2013-03-09 MED ORDER — FAMOTIDINE 20 MG PO TABS
20.0000 mg | ORAL_TABLET | Freq: Two times a day (BID) | ORAL | Status: DC
  Administered 2013-03-09 – 2013-03-10 (×3): 20 mg via ORAL
  Filled 2013-03-09 (×3): qty 1

## 2013-03-09 MED ORDER — WARFARIN SODIUM 7.5 MG PO TABS: 7.5000 mg | ORAL_TABLET | Freq: Once | ORAL | Status: DC

## 2013-03-09 NOTE — Care Management Note (Signed)
    Page 1 of 2   03/09/2013     3:21:33 PM   CARE MANAGEMENT NOTE 03/09/2013  Patient:  Victoria Stein, Victoria Stein   Account Number:  [id number]  Date Initiated:  03/09/2013  Documentation initiated by:  Theophilus Kinds  Subjective/Objective Assessment:   Pt admitted from home with PE. Pt lives with her husband and will return home at discharge. Pt is independent with ADL's.     Action/Plan:   Pt very anxious about discharging home with lovenox. CM discussed [facility] with pt and pt accepted services and very appreciative of offering. Pt choose [facility] for RN [facility] services. Romualdo Bolk of [facility] is aware and will collect pts information   Anticipated DC Date:  03/10/2013   Anticipated DC Plan:  [place]  CM consult      [facility] Choice  HOME HEALTH   Choice offered to / List presented to:  C-1 Patient        [place] arranged  HH-1 RN      [place].   Status of service:  Completed, signed off Medicare Important Message given?   (If response is "NO", the following Medicare IM given date fields will be blank) Date Medicare IM given:   Date Additional Medicare IM given:    Discharge Disposition:  HOME/SELF CARE  Per UR Regulation:    If discussed at Long Length of Stay Meetings, dates discussed:    Comments:  03/09/13 [place], RN BSN CM from the chart. [place] services to start within 48 hours of discharge. Pt and pts nurse aware of discharge arrangements.

## 2013-03-09 NOTE — Plan of Care (Signed)
Problem: Phase I Progression Outcomes Goal: Pain controlled with appropriate interventions Outcome: Not Applicable Date Met:  30/14/84 No complaints of pain Goal: OOB as tolerated unless otherwise ordered Outcome: Completed/Met Date Met:  03/09/13 Ambulates to  bathroom Goal: Initial discharge plan identified Outcome: [place] with spouse

## 2013-03-09 NOTE — Discharge Instructions (Addendum)
Pulmonary Embolus A pulmonary (lung) embolus (PE) is a blood clot that has traveled from another place in the body to the lung. Most clots come from deep veins in the legs or pelvis. PE is a dangerous and potentially life-threatening condition that can be treated if identified. CAUSES Blood clots form in a vein for different reasons. Usually several things cause blood clots. They include:  The flow of blood slows down.  The inside of the vein is damaged in some way.  The person has a condition that makes the blood clot more easily. These conditions may include:  Older age (especially over 75 years old).  Having a history of blood clots.  Having major or lengthy surgery. Hip surgery is particularly high-risk.  Breaking a hip or leg.  Sitting or lying still for a long time.  Cancer or cancer treatment.  Having a long, thin tube (catheter) placed inside a vein during a medical procedure.  Being overweight (obese).  Pregnancy and childbirth.  Medicines with estrogen.  Smoking.  Other circulation or heart problems. SYMPTOMS  The symptoms of a PE usually start suddenly and include:  Shortness of breath.  Coughing.  Coughing up blood or blood-tinged mucus (phlegm).  Chest pain. Pain is often worse with deep breaths.  Rapid heartbeat. DIAGNOSIS  If a PE is suspected, your caregiver will take a medical history and carry out a physical exam. Your caregiver will check for the risk factors listed above. Tests that also may be required include:  Blood tests, including studies of the clotting properties of your blood.  Imaging tests. Ultrasound, CT, MRI, and other tests can all be used to see if you have clots in your legs or lungs. If you have a clot in your legs and have breathing or chest problems, your caregiver may conclude that you have a clot in your lungs. Further lung tests may not be needed.  Electrocardiography can look for heart strain from blood clots in the  lungs. PREVENTION   Exercise the legs regularly. Take a brisk 30 minute walk every day.  Maintain a weight that is appropriate for your height.  Avoid sitting or lying in bed for long periods of time without moving your legs.  Women, particularly those over the age of 35, should consider the risks and benefits of taking estrogen medicines, including birth control pills.  Do not smoke, especially if you take estrogen medicines.  Long-distance travel can increase your risk. You should exercise your legs by walking or pumping the muscles every hour.  In hospital prevention:  Your caregiver will assess your need for preventive PE care (prophylaxis) when you are admitted to the hospital. If you are having surgery, your surgeon will assess you the day of or day after surgery.  Prevention may include medical and nonmedical measures. TREATMENT   The most common treatment for a PE is blood thinning (anticoagulant) medicine, which reduces the blood's tendency to clot. Anticoagulants can stop new blood clots from forming and old ones from growing. They cannot dissolve existing clots. Your body does this by itself over time. Anticoagulants can be given by mouth, by intravenous (IV) access, or by injection. Your caregiver will determine the best program for you.  Less commonly, clot-dissolving drugs (thrombolytics) are used to dissolve a PE. They carry a high risk of bleeding, so they are used mainly in severe cases.  Very rarely, a blood clot in the leg needs to be removed surgically.  If you are unable to   take anticoagulants, your caregiver may arrange for you to have a filter placed in a main vein in your abdomen. This filter prevents clots from traveling to your lungs. HOME CARE INSTRUCTIONS   Take all medicines prescribed by your caregiver. Follow the directions carefully.  Warfarin. Most people will continue taking warfarin after hospital discharge. Your caregiver will advise you on the  length of treatment (usually 3 6 months, sometimes lifelong).  Too much and too little warfarin are both dangerous. Too much warfarin increases the risk of bleeding. Too little warfarin continues to allow the risk for blood clots. While taking warfarin, you will need to have regular blood tests to measure your blood clotting time. These blood tests usually include both the prothrombin time (PT) and International Normalized Ratio (INR) tests. The PT and INR results allow your caregiver to adjust your dose of warfarin. The dose can change for many reasons. It is critically important that you take warfarin exactly as prescribed, and that you have your PT and INR levels drawn exactly as directed.  Many foods, especially foods high in vitamin K can interfere with warfarin and affect the PT and INR results. Foods high in vitamin K include spinach, kale, broccoli, cabbage, collard and turnip greens, brussels sprouts, peas, cauliflower, seaweed, and parsley as well as beef and pork liver, green tea, and soybean oil. You should eat a consistent amount of foods high in vitamin K. Avoid major changes in your diet, or notify your caregiver before changing your diet. Arrange a visit with a dietitian to answer your questions.  Many medicines can interfere with warfarin and affect the PT and INR results. You must tell your caregiver about any and all medicines you take, this includes all vitamins and supplements. Be especially cautious with aspirin and anti-inflammatory medicines. Ask your caregiver before taking these. Do not take or discontinue any prescribed or over-the-counter medicine except on the advice of your caregiver or pharmacist.  Warfarin can have side effects, such as excessive bruising or bleeding. You will need to hold pressure over cuts for longer than usual.  Alcohol can change the body's ability to handle warfarin. It is best to avoid alcoholic drinks or consume only very small amounts while taking  warfarin. Notify your caregiver if you change your alcohol intake.  Notify your dentist or other caregivers before procedures.  Avoid contact sports.  Wear a medical alert bracelet or carry a medical alert card.  Ask your caregiver how soon you can go back to normal activities. Not being active can lead to new clots. Ask for a list of what you should and should not do.  Compression stockings. These are tight elastic stockings that apply pressure to the lower legs. This can help keep the blood in the legs from clotting. You may need to wear compressions stockings at home to help prevent clots.  Smoking. If you smoke, quit. Ask your caregiver for help with quitting smoking.  Learn as much as you can about PE. Educating yourself can help prevent PE from reoccurring. SEEK MEDICAL CARE IF:   You notice a rapid heartbeat.  You feel weaker or more tired than usual.  You feel faint.  You notice increased bruising.  Your symptoms are not getting better in the time expected.  You are having side effects of medicine. SEEK IMMEDIATE MEDICAL CARE IF:   You have chest pain.  You have trouble breathing.  You have new or increased swelling or pain in one leg.  You   cough up blood.  You notice blood in vomit, in a bowel movement, or in urine.  You have an oral temperature above 102 F (38.9 C), not controlled by medicine. You may have another PE. A blood clot in the lungs is a medical emergency. Call your local emergency services (911 in [place] to get to the nearest hospital or clinic. Do not drive yourself. MAKE SURE YOU:   Understand these instructions.  Will watch your condition.  Will get help right away if you are not doing well or get worse. Document Released: 12/21/1999 Document Revised: 06/24/2011 Document Reviewed: 06/26/2008 [employer] Patient Information 2014 [employer].  

## 2013-03-09 NOTE — Progress Notes (Signed)
[facility] PROGRESS NOTE  Victoria Stein ZYS:[id number] DOB: Jun 05, 1970 DOA: 03/08/2013 PCP: Purvis Kilts, MD    Code Status: Full code Family Communication: Family not available. Disposition Plan: Anticipate discharge to home in the next 24 hours.   Consultants:  Hematology  Procedures:  2-D echocardiogram pending.  Antibiotics:  None  HPI/Subjective: The patient complains of nausea. She denies abdominal pain. She denies vomiting. She has significantly less chest pain. She is somewhat anxious about the "blood clot" in her long getting larger. She is afraid to give herself Lovenox injections and would prefer that her husband be instructed on home self injections.  Objective: Filed Vitals:   03/09/13 0730  BP:   Pulse:   Temp: 98.2 F (36.8 C)  Resp:    No intake or output data in the 24 hours ending 03/09/13 0856 Filed Weights   03/08/13 0155 03/08/13 0853 03/09/13 0500  Weight: 63.05 kg (139 lb) 62.3 kg (137 lb 5.6 oz) 62.1 kg (136 lb 14.5 oz)   Temperature 98.2. Respiratory rate 13. Blood pressure 100/52. Heart rate 110. Oxygen saturation 99% on room air.  Exam:   General: Pleasant anxious-appearing 43 year old Caucasian woman sitting up in bed, in no acute distress.  Cardiovascular: S1, S2, with mild tachycardia.  Respiratory: Clear to auscultation bilaterally.  Abdomen: Positive bowel sounds, soft, nontender, nondistended.  Musculoskeletal/extremities: Pedal pulses palpable bilaterally. No pedal edema. No calf edema. No acute hot red joints.  Neuropsychiatric: She is slightly anxious and tearful. Otherwise, she is alert and oriented x3.  Data Reviewed: Basic Metabolic Panel:  Recent Labs Lab 03/08/13 0241 03/09/13 0450  NA 140 141  K 3.6* 4.2  CL 102 104  CO2 26 26  GLUCOSE 108* 86  BUN 11 7  CREATININE 0.77 0.76  CALCIUM 9.2 9.2   Liver Function Tests:  Recent Labs Lab 03/09/13 0450  AST 14  ALT 9  ALKPHOS 68  BILITOT  0.3  PROT 6.9  ALBUMIN 3.6   No results found for this basename: LIPASE, AMYLASE,  in the last 168 hours No results found for this basename: AMMONIA,  in the last 168 hours CBC:  Recent Labs Lab 03/08/13 0241 03/09/13 0450  WBC 8.8 9.1  NEUTROABS 5.7  --   HGB 10.3* 10.9*  HCT 33.0* 34.4*  MCV 76.6* 77.0*  PLT 215 224   Cardiac Enzymes:  Recent Labs Lab 03/08/13 0241 03/08/13 0519 03/08/13 1149  TROPONINI <0.30 <0.30 <0.30   BNP (last 3 results) No results found for this basename: PROBNP,  in the last 8760 hours CBG: No results found for this basename: GLUCAP,  in the last 168 hours  Recent Results (from the past 240 hour(s))  MRSA PCR SCREENING     Status: None   Collection Time    03/08/13  8:42 AM      Result Value Ref Range Status   MRSA by PCR NEGATIVE  NEGATIVE Final   Comment:            The GeneXpert MRSA Assay (FDA     approved for NASAL specimens     only), is one component of a     comprehensive MRSA colonization     surveillance program. It is not     intended to diagnose MRSA     infection nor to guide or     monitor treatment for     MRSA infections.     Studies: Ct Angio Chest Pe W/cm &/or Wo Cm  03/08/2013   CLINICAL DATA:  Chest pain.  History of left arm blood clot.  EXAM: CT ANGIOGRAPHY CHEST WITH CONTRAST  TECHNIQUE: Multidetector CT imaging of the chest was performed using the standard protocol during bolus administration of intravenous contrast. Multiplanar CT image reconstructions and MIPs were obtained to evaluate the vascular anatomy.  CONTRAST:  156mL OMNIPAQUE IOHEXOL 350 MG/ML SOLN  COMPARISON:  Coronary CTA 11/26/2007  FINDINGS: THORACIC INLET/BODY WALL:  No acute abnormality.  MEDIASTINUM:  Normal heart size.  No pericardial effusion.  There is a solitary pulmonary arterial filling defect within a sub segmental anterior right upper lobe branch. No acute systemic arterial abnormality. No adenopathy.  LUNG WINDOWS:  No consolidation.  No  effusion.  No suspicious pulmonary nodule.  UPPER ABDOMEN:  No acute findings.  OSSEOUS:  No acute fracture.  No suspicious lytic or blastic lesions.  Critical Value/emergent results were called by telephone at the time of interpretation on 03/08/2013 at 5:26 AM to Dr. Ripley Fraise , who verbally acknowledged these results.  Review of the MIP images confirms the above findings.  IMPRESSION: Solitary subsegmental pulmonary embolism to the right upper lobe.   Electronically Signed   By: Jorje Guild M.D.   On: 03/08/2013 05:26   US Venous Img Lower Bilateral  03/08/2013   CLINICAL DATA:  Pulmonary emboli .  Pain, edema.  EXAM: BILATERAL LOWER EXTREMITY VENOUS DOPPLER ULTRASOUND  TECHNIQUE: Gray-scale sonography with compression, as well as color and duplex ultrasound, were performed to evaluate the deep venous system from the level of the common femoral vein through the popliteal and proximal calf veins.  COMPARISON:  None  FINDINGS: Normal compressibility of the common femoral, superficial femoral, and popliteal veins, as well as the proximal calf veins. No filling defects to suggest DVT on grayscale or color Doppler imaging. Doppler waveforms show normal direction of venous flow, normal respiratory phasicity and response to augmentation. Visualized segments of saphenous venous system normal in caliber and compressibility.  IMPRESSION: No evidence of  lower extremity deep vein thrombosis.   Electronically Signed   By: Arne Cleveland M.D.   On: 03/08/2013 14:57   US Venous Img Upper Uni Left  03/08/2013   CLINICAL DATA:  Recent diagnosis of left brachial vein DVT.  EXAM: LEFT UPPER EXTREMITY VENOUS DOPPLER ULTRASOUND  TECHNIQUE: Gray-scale sonography with graded compression, as well as color Doppler and duplex ultrasound were performed to evaluate the upper extremity deep venous system from the level of the subclavian vein and including the jugular, axillary, basilic and upper cephalic vein. Spectral Doppler  was utilized to evaluate flow at rest and with distal augmentation maneuvers.  COMPARISON:  US VENOUS IMG UPPER UNI *L* dated 03/03/2013  FINDINGS: Thrombus within deep veins: Need nonocclusive thrombus identified in 1 of 2 paired deep brachial veins of the upper arm demonstrates improved appearance with some residual mural thrombus remaining.  Venous reflux:  None visualized.  Other findings: Superficial thrombophlebitis with nonocclusive thrombus in the basilic vein of the upper arm and the cephalic vein in the forearm again noted. Both veins appear less distended by thrombus.  IMPRESSION: No propagation of brachial vein DVT or superficial thrombus in the basilic vein and cephalic vein of the left arm. Nonocclusive thrombus in the brachial vein appears improved. The cephalic and basilic veins appear less distended by thrombus.   Electronically Signed   By: Aletta Edouard M.D.   On: 03/08/2013 15:05    Scheduled Meds: . coumadin book  Does not apply Once  . docusate sodium  100 mg Oral BID  . enoxaparin (LOVENOX) injection  60 mg Subcutaneous Q12H  . sodium chloride  3 mL Intravenous Q12H  . sodium chloride  3 mL Intravenous Q12H  . warfarin  7.5 mg Oral Once  . Warfarin - Pharmacist Dosing Inpatient   Does not apply Q24H   Continuous Infusions:    Assessment: Principal Problem:   Acute pulmonary embolism Active Problems:   DVT of upper extremity (deep vein thrombosis)   Acute anxiety   Microcytic anemia    1. Acute pulmonary embolism, presumably secondary to previously diagnosed left upper extremity DVT on 03/03/2013. It is uncertain if the PE represented an Xarelto failure. Hematology/oncology has evaluated the patient and made recommendations. (Appreciate assistance). We'll continue Lovenox 1 mg per kilogram every 12 hours following discharge for a total of 6 months. Of note, left upper extremity venous ultrasound revealed no propagation of previously diagnosed DVT. Bilateral lower  extremity ultrasound revealed no evidence of DVT. 2-D echocardiogram is pending. Hypercoagulable  Laboratory studies are pending. Will discontinue warfarin which was previously ordered.  Microcytic anemia. She is a menstruating woman, but reports that her periods are not heavy. We'll order some anemia studies.  Anxiety. Will continue when necessary Xanax.     Plan: 1. As above. 2. The patient was encouraged to ambulate. 3. Last nursing staff to educate the patient and/or her husband regarding self Lovenox injections for home. 4. We'll order a total iron and ferritin. 5. Discontinue Coumadin as the patient will be discharged on Lovenox only.  Time spent: 35 minutes    [facility] Pager [phone]. If 7PM-7AM, please contact night-coverage at [email or web address], password [facility] 03/09/2013, 8:56 AM  LOS: 1 day

## 2013-03-09 NOTE — Progress Notes (Signed)
ANTICOAGULATION CONSULT NOTE - Follow up  Pharmacy Consult for Lovenox >> Warfarin Indication: pulmonary embolus  Allergies  Allergen Reactions  . Amoxicillin Swelling   Patient Measurements: Height: 5\' 3"  (160 cm) Weight: 136 lb 14.5 oz (62.1 kg) IBW/kg (Calculated) : 52.4  Vital Signs: Temp: 98.2 F (36.8 C) (03/04 0730) Temp src: Oral (03/04 0730) BP: 100/52 mmHg (03/04 0438)  Labs:  Recent Labs  03/08/13 0241 03/08/13 0519 03/08/13 0739 03/08/13 1147 03/08/13 1149 03/09/13 0450  HGB 10.3*  --   --   --   --  10.9*  HCT 33.0*  --   --   --   --  34.4*  PLT 215  --   --   --   --  224  APTT  --   --   --  37  --   --   LABPROT  --   --  25.2* 15.3*  --  14.9  INR  --   --  2.38* 1.24  --  1.20  CREATININE 0.77  --   --   --   --  0.76  TROPONINI <0.30 <0.30  --   --  <0.30  --    Estimated Creatinine Clearance: 75 ml/min (by C-G formula based on Cr of 0.76).  Medical History: Past Medical History  Diagnosis Date  . Normal cardiac stress test 2009    normal coronary CT scan  . Parotid mass 02/2013     Benign  . DVT of upper extremity (deep vein thrombosis) 02/2013  . Acute pulmonary embolism 03/08/2013  . Microcytic anemia 03/09/2013   Medications:  Prescriptions prior to admission  Medication Sig Dispense Refill  . acetaminophen (TYLENOL CHILDRENS) 160 MG/5ML suspension Take 480 mg by mouth every 6 (six) hours as needed for moderate pain.      Marland Kitchen acetaminophen-codeine (TYLENOL #3) 300-30 MG per tablet Take 1 tablet by mouth every 4 (four) hours as needed for moderate pain or severe pain.      . diazepam (VALIUM) 2 MG tablet Take 2 mg by mouth every 8 (eight) hours as needed for anxiety.       Assessment: 43 yo F who was diagnosed with DVT on 03/03/13 and started on Xarelto.  She has returned with chest pain thought to be related to her anxiety.  Chest CT + PE.  O2 sats have been 100% on RA.  Work-up for PE not previously done so unknown if this is new event and  thus Xarelto treatment failure.  Last Xarelto dose was 3/2 at 9pm. No bleeding noted.  INR at baseline. Renal function is normal. Awaiting hematology input for long-term treatment  recommendations.  Day 2/5 Lovenox/Warfarin overlap    Goal of Therapy:  Anti-Xa level 0.6-1 units/ml 4hrs after LMWH dose given Monitor platelets by anticoagulation protocol: Yes INR Goal 2-3  Plan:  Lovenox 60mg  sq q12h Coumadin 7.5mg  PO today x 1 INR daily Monitor CBC & renal function  Primary MD starting warfarin pending heme consult. Overlap Lovenox/Warfarin x 5 days minimum and INR > 2 x 24h  Netta Cedars, PharmD, BCPS  Addendum: Baseline INR noted.  Last Xarelto dose given @ 9PM on 3/2. Warfarin 5mg  PO given on 3/3 @ 9pm. Provide Education  F/U Heme recommendations.  Ena Dawley, [facility] 03/09/2013 8:49 AM

## 2013-03-09 NOTE — Progress Notes (Signed)
*  PRELIMINARY RESULTS* Echocardiogram 2D Echocardiogram has been performed.  [place], [place] 03/09/2013, 11:29 AM

## 2013-03-09 NOTE — Progress Notes (Signed)
UR chart review completed.  

## 2013-03-09 NOTE — Progress Notes (Signed)
Patient transferred to room 333. Report given to Gershon Cull RN. Vital signs stable at transfer.

## 2013-03-09 NOTE — Progress Notes (Signed)
Subjective: Patient has been moved to the regular floor.    She is doing well.  She denies any complaints.  I provided her education regarding VTE.  I provided her education regarding Lovenox (LMWH) therapy.  We discussed the risks, benefits, alternatives, and side effects of therapy.  I went over healthy and safe lifestyle choices such as wearing a seatbelt, following safety instructions of power tools and guns, etc.   I personally reviewed and went over laboratory results with the patient.  The results are noted within this dictation.  I personally reviewed and went over radiographic studies with the patient.  The results are noted within this dictation.   [place] of LE is negative for DVT.  Her left UE [place] study was improved with a nonocclusive DVT.  2D echo has been completed but without reading from cardiology at this time.   Objective: Vital signs in last 24 hours: Temp:  [97.9 F (36.6 C)-98.4 F (36.9 C)] 98 F (36.7 C) (03/04 1130) Resp:  [12-18] 13 (03/04 0438) BP: (100-134)/(52-76) 100/52 mmHg (03/04 0438) SpO2:  [99 %] 99 % (03/04 0000) Weight:  [136 lb 14.5 oz (62.1 kg)] 136 lb 14.5 oz (62.1 kg) (03/04 0500)  Intake/Output from previous day:   Intake/Output this shift:    General appearance: alert, cooperative, appears stated age and no distress  Lab Results:   Recent Labs  03/08/13 0241 03/09/13 0450  WBC 8.8 9.1  HGB 10.3* 10.9*  HCT 33.0* 34.4*  PLT 215 224   BMET  Recent Labs  03/08/13 0241 03/09/13 0450  NA 140 141  K 3.6* 4.2  CL 102 104  CO2 26 26  GLUCOSE 108* 86  BUN 11 7  CREATININE 0.77 0.76  CALCIUM 9.2 9.2    Studies/Results: Ct Angio Chest Pe W/cm &/or Wo Cm  03/08/2013   CLINICAL DATA:  Chest pain.  History of left arm blood clot.  EXAM: CT ANGIOGRAPHY CHEST WITH CONTRAST  TECHNIQUE: Multidetector CT imaging of the chest was performed using the standard protocol during bolus administration of intravenous contrast. Multiplanar CT image  reconstructions and MIPs were obtained to evaluate the vascular anatomy.  CONTRAST:  162mL OMNIPAQUE IOHEXOL 350 MG/ML SOLN  COMPARISON:  Coronary CTA 11/26/2007  FINDINGS: THORACIC INLET/BODY WALL:  No acute abnormality.  MEDIASTINUM:  Normal heart size.  No pericardial effusion.  There is a solitary pulmonary arterial filling defect within a sub segmental anterior right upper lobe branch. No acute systemic arterial abnormality. No adenopathy.  LUNG WINDOWS:  No consolidation.  No effusion.  No suspicious pulmonary nodule.  UPPER ABDOMEN:  No acute findings.  OSSEOUS:  No acute fracture.  No suspicious lytic or blastic lesions.  Critical Value/emergent results were called by telephone at the time of interpretation on 03/08/2013 at 5:26 AM to Dr. Ripley Fraise , who verbally acknowledged these results.  Review of the MIP images confirms the above findings.  IMPRESSION: Solitary subsegmental pulmonary embolism to the right upper lobe.   Electronically Signed   By: Jorje Guild M.D.   On: 03/08/2013 05:26   US Venous Img Lower Bilateral  03/08/2013   CLINICAL DATA:  Pulmonary emboli .  Pain, edema.  EXAM: BILATERAL LOWER EXTREMITY VENOUS DOPPLER ULTRASOUND  TECHNIQUE: Gray-scale sonography with compression, as well as color and duplex ultrasound, were performed to evaluate the deep venous system from the level of the common femoral vein through the popliteal and proximal calf veins.  COMPARISON:  None  FINDINGS: Normal  compressibility of the common femoral, superficial femoral, and popliteal veins, as well as the proximal calf veins. No filling defects to suggest DVT on grayscale or color Doppler imaging. Doppler waveforms show normal direction of venous flow, normal respiratory phasicity and response to augmentation. Visualized segments of saphenous venous system normal in caliber and compressibility.  IMPRESSION: No evidence of  lower extremity deep vein thrombosis.   Electronically Signed   By: Arne Cleveland  M.D.   On: 03/08/2013 14:57   US Venous Img Upper Uni Left  03/08/2013   CLINICAL DATA:  Recent diagnosis of left brachial vein DVT.  EXAM: LEFT UPPER EXTREMITY VENOUS DOPPLER ULTRASOUND  TECHNIQUE: Gray-scale sonography with graded compression, as well as color Doppler and duplex ultrasound were performed to evaluate the upper extremity deep venous system from the level of the subclavian vein and including the jugular, axillary, basilic and upper cephalic vein. Spectral Doppler was utilized to evaluate flow at rest and with distal augmentation maneuvers.  COMPARISON:  US VENOUS IMG UPPER UNI *L* dated 03/03/2013  FINDINGS: Thrombus within deep veins: Need nonocclusive thrombus identified in 1 of 2 paired deep brachial veins of the upper arm demonstrates improved appearance with some residual mural thrombus remaining.  Venous reflux:  None visualized.  Other findings: Superficial thrombophlebitis with nonocclusive thrombus in the basilic vein of the upper arm and the cephalic vein in the forearm again noted. Both veins appear less distended by thrombus.  IMPRESSION: No propagation of brachial vein DVT or superficial thrombus in the basilic vein and cephalic vein of the left arm. Nonocclusive thrombus in the brachial vein appears improved. The cephalic and basilic veins appear less distended by thrombus.   Electronically Signed   By: Aletta Edouard M.D.   On: 03/08/2013 15:05    Medications: I have reviewed the patient's current medications.  Assessment/Plan: 1. VTE as described in #1 and #2.  Negative [place] of LE Bilaterally.  Left UE doppler is improved with a continued nonocclusive DVT.  2D echo was completed today (3/4) and has not yet been read by cardiology.  Hypercoag panel is pending, results thus far are negative.  2. Subsegmental right upper lobe PE 3. Left brachial vein DVT diagnosed on 03/03/2013. Suspect the inciting factor was from 3 risk factors:  A. left lateral wrist IV needle for surgery  B.  Surgery for parotid gland mass  C. Increased sedentary lifestyle x 4-8 weeks due to medical issues.  4. ?Xarelto failure?  Patient started on Xarelto at time of dx of left brachial DVT on 2/26.  Found to have a symptomatic PE on 3/3. 5. Right parotid mass, S/P resection on 02/23/2013 by Dr. Constance Holster with pathology demonstrating benign salivary tissue with pleomorphic adenoma and benign lymph nodes 6. Anxiety with history of panic attacks, on Valium and Xanax by PCP, Dr. Hilma Favors. 7. Recommend Lovenox 1 mg/kg twice daily and continue as an outpatient.  She will require anticoagulation x 6 months. 8. Follow-up appointment at the [facility] is made for 03/23/2013 at 3 PM with Dr. Barnet Glasgow  Patient and plan discussed with Dr. Farrel Gobble and he is in agreement with the aforementioned.      LOS: 1 day    Victoria Stein 03/09/2013

## 2013-03-10 ENCOUNTER — Encounter (HOSPITAL_COMMUNITY): Payer: Self-pay | Admitting: Internal Medicine

## 2013-03-10 DIAGNOSIS — I5189 Other ill-defined heart diseases: Secondary | ICD-10-CM | POA: Diagnosis present

## 2013-03-10 LAB — CBC
HEMATOCRIT: 34 % — AB (ref 36.0–46.0)
Hemoglobin: 10.4 g/dL — ABNORMAL LOW (ref 12.0–15.0)
MCH: 23.5 pg — ABNORMAL LOW (ref 26.0–34.0)
MCHC: 30.6 g/dL (ref 30.0–36.0)
MCV: 76.7 fL — ABNORMAL LOW (ref 78.0–100.0)
Platelets: 209 10*3/uL (ref 150–400)
RBC: 4.43 MIL/uL (ref 3.87–5.11)
RDW: 17.4 % — AB (ref 11.5–15.5)
WBC: 7.1 10*3/uL (ref 4.0–10.5)

## 2013-03-10 MED ORDER — ENOXAPARIN SODIUM 60 MG/0.6ML ~~LOC~~ SOLN: 60.0000 mg | Freq: Two times a day (BID) | SUBCUTANEOUS | Status: DC

## 2013-03-10 MED ORDER — ONDANSETRON HCL 4 MG PO TABS: 4.0000 mg | ORAL_TABLET | Freq: Four times a day (QID) | ORAL | Status: DC | PRN

## 2013-03-10 MED ORDER — ALPRAZOLAM 0.5 MG PO TABS: 0.5000 mg | ORAL_TABLET | Freq: Four times a day (QID) | ORAL | Status: DC | PRN

## 2013-03-10 MED ORDER — FAMOTIDINE 10 MG PO TABS: 10.0000 mg | ORAL_TABLET | Freq: Two times a day (BID) | ORAL | Status: DC

## 2013-03-10 NOTE — Discharge Summary (Signed)
Physician Discharge Summary  Victoria Stein [id number] DOB: 1970-12-30 DOA: 03/08/2013  PCP: Purvis Kilts, MD  Admit date: 03/08/2013 Discharge date: 03/10/2013  Time spent: Greater than 35 minutes  Recommendations for Outpatient Follow-up:  1. Recommends starting ferrous sulfate supplementation at the hospital followup appointments for ferritin of 8.   Discharge Diagnoses:  1. Right upper lobe pulmonary embolus, presumably secondary to a combination of left upper extremity DVT, recent surgery for parotid gland mass, and sedentary lifestyle over the past few weeks. 2. Left upper extremity DVT, diagnosed on 03/03/2013, likely secondary to left lateral wrist IV needle during parotid gland surgery. 3. Anxiety disorder with a history of panic attacks. 4. Right parotid mass, status post resection on 02/23/2013 by Dr. Constance Holster. The pathology report revealed benign salivary tissue with pleomorphic adenoma and benign lymph nodes. 5. Microcytic anemia in a menstruating female. Her hemoglobin was 10.4 at discharge.  Discharge Condition: Improved  Diet recommendation: Regular  Filed Weights   03/08/13 0155 03/08/13 0853 03/09/13 0500  Weight: 63.05 kg (139 lb) 62.3 kg (137 lb 5.6 oz) 62.1 kg (136 lb 14.5 oz)    History of present illness:   Victoria Stein is a 43 y.o. female with no significant past medical history except that most recently she was diagnosed with a left upper extremity DVT a few days ago. Over the past couple months patient had been experiencing jaw pain. She has been seen by numerous specialists, including dental surgeons and ENT. She had wisdom teeth extraction without relief of pain. 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 She underwent surgery for same in February 2015. The tumor was found to be benign. During that surgical procedure, which was done in an outpatient facility, she had an IV placed in her left arm. And during that stay she noticed  that the IV site was red. The redness spread across the arm to involve part of the upper arm as well. Subsequently, she came in to the emergency department on February 26 and was diagnosed with a DVT of brachial vein in the left arm. She was started on Xarelto. Patient had been extremely anxious for the last couple of months. Her anxiety level, increased over the last few days after she was diagnosed with a DVT. She started having upper back pain a few days later. She came in to the emergency department. She was reassured and discharged. And, then at 10 PM last night she started having chest pain in the central part of the chest. It felt like a pressure-like sensation. Was 8/10 in intensity. She tried taking her diazepam which was prescribed recently by her primary care physician without any relief. She denied any shortness of breath, but had been having some dizziness. No fever. No chills. No cough. Denies any leg swelling. No previously known history of blood clots. She subsequently decided to come back to the hospital. CT angiogram of the chest was ordered and it revealed a solitary subsegmental pulmonary embolism in the right upper lobe. She was admitted for further management.    Hospital Course:   1. Acute pulmonary embolism, presumably secondary to previously diagnosed left upper extremity DVT on 03/03/2013.  It was uncertain if the PE represented an Xarelto failure. Nevertheless, treatment was started Lovenox and Coumadin and Xarelto was discontinued. Hematology/oncology was consulted and evaluated the patient and made recommendations. Per their assessment, it was difficult to definitively say that there was a Xarelto failure because it  was unclear if the PE was there all along and she had recently become symptomatic. That being said, they recommended Lovenox 1 mg per kilogram every 12 hours following discharge for a total of 6 months to ensure adequate absorption. It was suspected that the inciting  factor for the VTE was from the insertion of a foreign object (IV in the left lateral wrist at the time of parotid gland surgery) and surgery itself. Another left upper extremity venous ultrasound and bilateral lower extremity venous ultrasound were ordered for further evaluation. The left upper extremity venous ultrasound revealed no propagation of previously diagnosed DVT. Bilateral lower extremity ultrasound revealed no evidence of DVT. 2-D echocardiogram revealed no significant valvular abnormality or gross vegetations or thrombus. It did reveal grade 2 diastolic dysfunction which likely had nothing to do with the pulmonary embolus. There was no evidence of pulmonary hypertension. Hypercoagulable study results revealed a negative lupus anticoagulant and borderline positive anticardiolipin IgM. Coumadin was discontinued. The patient will continue to followup with hematology/oncology. Recommended duration of treatment and 6 months.   Microcytic iron deficiency anemia.  She is a menstruating woman, but reported that her periods were not traditionally heavy. Her total iron was slightly low at 27, TIBC normal at 436, and ferritin was low at 8. Ferrous sulfate was inadvertently left off her discharge medication list, although there was an intention to start supplementation. This will be discussed with Mr. Sheldon Silvan, hematology/oncology PA who will see the patient on 03/23/13. She was, however, started on Pepcid prophylactically.  Acute and chronic anxiety. The patient had been started on treatment with diazepam, however, she did not believe it was helping. Therefore, diazepam was discontinued and she was started on Xanax which appeared to help more. She was given a prescription for 20 tablets with no refills. Further management will be deferred to her primary care provider.    Procedures: 2-D echocardiogram 03/09/13 :Study Conclusions  - Left ventricle: The cavity size was normal. Wall thickness was at the  upper limits of normal. Systolic function was normal. The estimated ejection fraction was in the range of 55% to 60%. Wall motion was normal; there were no regional wall motion abnormalities. Features are consistent with a pseudonormal left ventricular filling pattern, with concomitant abnormal relaxation and increased filling pressure (grade 2 diastolic dysfunction). - Right ventricle: Systolic function was normal. - Right atrium: Central venous pressure: 15mm Hg (est). - Atrial septum: No defect or patent foramen ovale was identified. - Tricuspid valve: Mild regurgitation. - Pulmonary arteries: PA peak pressure: 40mm Hg (S). - Pericardium, extracardiac: A prominent pericardial fat pad was present. Impressions:  - Upper normal LV wall thickness with LVEF 0000000, grade 2 diastolic dysfunction. Normal RV contraction. Mild tricuspid regurgitation with normal PASP 23 mm mercury. Epicardial fat pad without effusion.    Consultations:  Hematology/oncology  Discharge Exam: Filed Vitals:   03/10/13 0624  BP: 136/71  Pulse: 77  Temp: 98 F (36.7 C)  Resp: 17    General: Pleasant, alert, less anxious 43 year old Caucasian woman sitting up in bed, in no acute distress. Cardiovascular: S1, S2, with no murmurs rubs or gallops. Respiratory: Clear to auscultation bilaterally.  Discharge Instructions  Discharge Orders   Future Appointments Provider Department Dept Phone   03/23/2013 3:00 PM [facility] Provider [place] [phone]   Future Orders Complete By Expires   Diet general  As directed    Increase activity slowly  As directed        Medication  List    STOP taking these medications       acetaminophen-codeine 300-30 MG per tablet  Commonly known as:  TYLENOL #3     diazepam 2 MG tablet  Commonly known as:  VALIUM      TAKE these medications       ALPRAZolam 0.5 MG tablet  Commonly known as:  XANAX  Take 1 tablet (0.5 mg total) by  mouth every 6 (six) hours as needed for anxiety.     enoxaparin 60 MG/0.6ML injection  Commonly known as:  LOVENOX  Inject 0.6 mLs (60 mg total) into the skin every 12 (twelve) hours.     famotidine 10 MG tablet  Commonly known as:  PEPCID  Take 1 tablet (10 mg total) by mouth 2 (two) times daily.     ondansetron 4 MG tablet  Commonly known as:  ZOFRAN  Take 1 tablet (4 mg total) by mouth every 6 (six) hours as needed for nausea.     TYLENOL CHILDRENS 160 MG/5ML suspension  Generic drug:  acetaminophen  Take 480 mg by mouth every 6 (six) hours as needed for moderate pain.       Allergies  Allergen Reactions  . Amoxicillin Swelling       Follow-up Information   Follow up with [facility] On 03/23/2013. (at 3 PM)    Contact information:   [address] [place] [address]       Follow up with [facility].   Contact information:   [address] [place] [address] [phone]       Follow up with Purvis Kilts, MD. (Follow up in 1-2 weeks)    Specialty:  Family Medicine   Contact information:   [address] [place] [address] [phone]        The results of significant diagnostics from this hospitalization (including imaging, microbiology, ancillary and laboratory) are listed below for reference.    Significant Diagnostic Studies: Ct Angio Chest Pe W/cm &/or Wo Cm  03/08/2013   CLINICAL DATA:  Chest pain.  History of left arm blood clot.  EXAM: CT ANGIOGRAPHY CHEST WITH CONTRAST  TECHNIQUE: Multidetector CT imaging of the chest was performed using the standard protocol during bolus administration of intravenous contrast. Multiplanar CT image reconstructions and MIPs were obtained to evaluate the vascular anatomy.  CONTRAST:  180mL OMNIPAQUE IOHEXOL 350 MG/ML SOLN  COMPARISON:  Coronary CTA 11/26/2007  FINDINGS: THORACIC INLET/BODY WALL:  No acute abnormality.  MEDIASTINUM:   Normal heart size.  No pericardial effusion.  There is a solitary pulmonary arterial filling defect within a sub segmental anterior right upper lobe branch. No acute systemic arterial abnormality. No adenopathy.  LUNG WINDOWS:  No consolidation.  No effusion.  No suspicious pulmonary nodule.  UPPER ABDOMEN:  No acute findings.  OSSEOUS:  No acute fracture.  No suspicious lytic or blastic lesions.  Critical Value/emergent results were called by telephone at the time of interpretation on 03/08/2013 at 5:26 AM to Dr. Ripley Fraise , who verbally acknowledged these results.  Review of the MIP images confirms the above findings.  IMPRESSION: Solitary subsegmental pulmonary embolism to the right upper lobe.   Electronically Signed   By: Jorje Guild M.D.   On: 03/08/2013 05:26   US Venous Img Lower Bilateral  03/08/2013   CLINICAL DATA:  Pulmonary emboli .  Pain, edema.  EXAM: BILATERAL LOWER EXTREMITY VENOUS DOPPLER ULTRASOUND  TECHNIQUE: Gray-scale sonography with compression, as well as color and duplex ultrasound, were performed to evaluate the deep venous system from the level of the common femoral vein through the popliteal and proximal calf veins.  COMPARISON:  None  FINDINGS: Normal compressibility of the common femoral, superficial femoral, and popliteal veins, as well as the proximal calf veins. No filling defects to suggest DVT on grayscale or color Doppler imaging. Doppler waveforms show normal direction of venous flow, normal respiratory phasicity and response to augmentation. Visualized segments of saphenous venous system normal in caliber and compressibility.  IMPRESSION: No evidence of  lower extremity deep vein thrombosis.   Electronically Signed   By: Arne Cleveland M.D.   On: 03/08/2013 14:57   US Venous Img Upper Uni Left  03/08/2013   CLINICAL DATA:  Recent diagnosis of left brachial vein DVT.  EXAM: LEFT UPPER EXTREMITY VENOUS DOPPLER ULTRASOUND  TECHNIQUE: Gray-scale sonography with graded  compression, as well as color Doppler and duplex ultrasound were performed to evaluate the upper extremity deep venous system from the level of the subclavian vein and including the jugular, axillary, basilic and upper cephalic vein. Spectral Doppler was utilized to evaluate flow at rest and with distal augmentation maneuvers.  COMPARISON:  US VENOUS IMG UPPER UNI *L* dated 03/03/2013  FINDINGS: Thrombus within deep veins: Need nonocclusive thrombus identified in 1 of 2 paired deep brachial veins of the upper arm demonstrates improved appearance with some residual mural thrombus remaining.  Venous reflux:  None visualized.  Other findings: Superficial thrombophlebitis with nonocclusive thrombus in the basilic vein of the upper arm and the cephalic vein in the forearm again noted. Both veins appear less distended by thrombus.  IMPRESSION: No propagation of brachial vein DVT or superficial thrombus in the basilic vein and cephalic vein of the left arm. Nonocclusive thrombus in the brachial vein appears improved. The cephalic and basilic veins appear less distended by thrombus.   Electronically Signed   By: Aletta Edouard M.D.   On: 03/08/2013 15:05   US Venous Img Upper Uni Left  03/03/2013   CLINICAL DATA:  Pain post parotid surgery.  Edema, color changes.  EXAM: LEFT UPPER EXTREMITY VENOUS DOPPLER ULTRASOUND  TECHNIQUE: Gray-scale sonography with graded compression, as well as color Doppler and duplex ultrasound were performed to evaluate the upper extremity deep venous system from the level of the subclavian vein and including the jugular, axillary, basilic and upper cephalic vein. Spectral Doppler was utilized to evaluate flow at rest and with distal augmentation maneuvers.  COMPARISON:  None.  FINDINGS: Thrombus within deep veins: Segmentally in one component of the duplicated left brachial vein.  Compressibility of deep veins: Decreased in the affected segment brachial vein  Duplex waveform respiratory  phasicity:  Normal.  Duplex waveform response to augmentation: Deferred secondary to presence of thrombus.  Venous reflux:  None visualized.  Other findings: There is also thrombosis of the cephalic vein at the level of the rest, with normal compressibility centrally. There is partial thrombosis of the basilic vein.  IMPRESSION: 1. Partially occlusive left brachial vein DVT. 2. Superficial thrombophlebitis involving basilic vein and cephalic vein in the forearm.   Electronically Signed   By: Arne Cleveland M.D.   On: 03/03/2013 11:23    Microbiology: Recent Results (from the past 240 hour(s))  MRSA PCR SCREENING     Status: None   Collection Time    03/08/13  8:42 AM      Result Value Ref Range Status  MRSA by PCR NEGATIVE  NEGATIVE Final   Comment:            The GeneXpert MRSA Assay (FDA     approved for NASAL specimens     only), is one component of a     comprehensive MRSA colonization     surveillance program. It is not     intended to diagnose MRSA     infection nor to guide or     monitor treatment for     MRSA infections.     Labs: Basic Metabolic Panel:  Recent Labs Lab 03/08/13 0241 03/09/13 0450  NA 140 141  K 3.6* 4.2  CL 102 104  CO2 26 26  GLUCOSE 108* 86  BUN 11 7  CREATININE 0.77 0.76  CALCIUM 9.2 9.2   Liver Function Tests:  Recent Labs Lab 03/09/13 0450  AST 14  ALT 9  ALKPHOS 68  BILITOT 0.3  PROT 6.9  ALBUMIN 3.6   No results found for this basename: LIPASE, AMYLASE,  in the last 168 hours No results found for this basename: AMMONIA,  in the last 168 hours CBC:  Recent Labs Lab 03/08/13 0241 03/09/13 0450 03/10/13 0604  WBC 8.8 9.1 7.1  NEUTROABS 5.7  --   --   HGB 10.3* 10.9* 10.4*  HCT 33.0* 34.4* 34.0*  MCV 76.6* 77.0* 76.7*  PLT 215 224 209   Cardiac Enzymes:  Recent Labs Lab 03/08/13 0241 03/08/13 0519 03/08/13 1149  TROPONINI <0.30 <0.30 <0.30   BNP: BNP (last 3 results) No results found for this basename:  PROBNP,  in the last 8760 hours CBG: No results found for this basename: GLUCAP,  in the last 168 hours     Signed:  Carmello Cabiness  [facility] 03/10/2013, 12:09 PM

## 2013-03-10 NOTE — ED Provider Notes (Signed)
Medical screening examination/treatment/procedure(s) were performed by non-physician practitioner and as supervising physician I was immediately available for consultation/collaboration.   Art Levan M Verlinda Slotnick, MD 03/10/13 2028 

## 2013-03-10 NOTE — Progress Notes (Signed)
ANTICOAGULATION CONSULT NOTE - Follow up  Pharmacy Consult for Lovenox Indication: pulmonary embolus  Allergies  Allergen Reactions  . Amoxicillin Swelling   Patient Measurements: Height: 5\' 3"  (160 cm) Weight: 136 lb 14.5 oz (62.1 kg) IBW/kg (Calculated) : 52.4  Vital Signs: Temp: 98 F (36.7 C) (03/05 0624) Temp src: Oral (03/05 0624) BP: 136/71 mmHg (03/05 0624) Pulse Rate: 77 (03/05 0624)  Labs:  Recent Labs  03/08/13 0241 03/08/13 0519 03/08/13 0739 03/08/13 1147 03/08/13 1149 03/09/13 0450 03/10/13 0604  HGB 10.3*  --   --   --   --  10.9* 10.4*  HCT 33.0*  --   --   --   --  34.4* 34.0*  PLT 215  --   --   --   --  224 209  APTT  --   --   --  37  --   --   --   LABPROT  --   --  25.2* 15.3*  --  14.9  --   INR  --   --  2.38* 1.24  --  1.20  --   CREATININE 0.77  --   --   --   --  0.76  --   TROPONINI <0.30 <0.30  --   --  <0.30  --   --    Estimated Creatinine Clearance: 75 ml/min (by C-G formula based on Cr of 0.76).  Medical History: Past Medical History  Diagnosis Date  . Normal cardiac stress test 2009    normal coronary CT scan  . Parotid mass 02/2013     Benign  . DVT of upper extremity (deep vein thrombosis) 02/2013  . Acute pulmonary embolism 03/08/2013  . Microcytic anemia 03/09/2013   Medications:  Prescriptions prior to admission  Medication Sig Dispense Refill  . acetaminophen (TYLENOL CHILDRENS) 160 MG/5ML suspension Take 480 mg by mouth every 6 (six) hours as needed for moderate pain.      Marland Kitchen acetaminophen-codeine (TYLENOL #3) 300-30 MG per tablet Take 1 tablet by mouth every 4 (four) hours as needed for moderate pain or severe pain.      . diazepam (VALIUM) 2 MG tablet Take 2 mg by mouth every 8 (eight) hours as needed for anxiety.       Assessment: 43 yo F who was diagnosed with DVT on 03/03/13 and started on Xarelto.  She has returned with chest pain thought to be related to her anxiety.  Chest CT + PE.  O2 sats have been 100% on RA.   Work-up for PE not previously done so unknown if this is new event and thus Xarelto treatment failure.  Last Xarelto dose was 3/2 at 9pm. No bleeding noted.  INR at baseline. Renal function is normal.  Hematology recommendations and plan will be to discharge pt home with Lovenox therapy.  Pt has been educated regarding Lovenox injections and therapy.    Goal of Therapy:  Anti-Xa level 0.6-1 units/ml 4hrs after LMWH dose given Monitor platelets by anticoagulation protocol: Yes  Plan:  Lovenox 60mg  sq q12h Monitor CBC & renal function & s/sx of bleeding  Ena Dawley, [facility] 03/10/2013 8:56 AM

## 2013-03-10 NOTE — Progress Notes (Signed)
Reinforced teaching about Lovenox administration. Pt very nervous and upset because she had been given misinformation about where to administer Lovenox. I printed a handout from the [facility] and gave it to her and her husband for a reference. Answered all questions she had for most of the night. Pt very nervous, Had to give her xanax x 2 last PM. Feels better this am.

## 2013-03-10 NOTE — Progress Notes (Signed)
Patient with orders to be discharge home with home health. Discharge instructions given, patient verbalized understanding. Patient's spouse administered Lovenox injection to patient under supervision of RN. Patient will be going home on Lovenox. Patient stable. Patient left in private vehicle with spouse.

## 2013-03-15 ENCOUNTER — Encounter (HOSPITAL_COMMUNITY): Payer: Self-pay | Admitting: Emergency Medicine

## 2013-03-15 ENCOUNTER — Emergency Department (HOSPITAL_COMMUNITY)
Admission: EM | Admit: 2013-03-15 | Discharge: 2013-03-15 | Disposition: A | Discharge status: Home / Self Care | Payer: BC Managed Care – PPO | Attending: Emergency Medicine | Admitting: Emergency Medicine

## 2013-03-15 DIAGNOSIS — Z8679 Personal history of other diseases of the circulatory system: Secondary | ICD-10-CM | POA: Insufficient documentation

## 2013-03-15 DIAGNOSIS — Z880 Allergy status to penicillin: Secondary | ICD-10-CM | POA: Insufficient documentation

## 2013-03-15 DIAGNOSIS — IMO0001 Reserved for inherently not codable concepts without codable children: Secondary | ICD-10-CM | POA: Insufficient documentation

## 2013-03-15 DIAGNOSIS — M79609 Pain in unspecified limb: Secondary | ICD-10-CM | POA: Insufficient documentation

## 2013-03-15 DIAGNOSIS — M79603 Pain in arm, unspecified: Secondary | ICD-10-CM

## 2013-03-15 DIAGNOSIS — R110 Nausea: Secondary | ICD-10-CM | POA: Insufficient documentation

## 2013-03-15 DIAGNOSIS — Z79899 Other long term (current) drug therapy: Secondary | ICD-10-CM | POA: Insufficient documentation

## 2013-03-15 DIAGNOSIS — Z862 Personal history of diseases of the blood and blood-forming organs and certain disorders involving the immune mechanism: Secondary | ICD-10-CM | POA: Insufficient documentation

## 2013-03-15 DIAGNOSIS — F419 Anxiety disorder, unspecified: Secondary | ICD-10-CM

## 2013-03-15 DIAGNOSIS — Z86718 Personal history of other venous thrombosis and embolism: Secondary | ICD-10-CM | POA: Insufficient documentation

## 2013-03-15 DIAGNOSIS — F411 Generalized anxiety disorder: Secondary | ICD-10-CM | POA: Insufficient documentation

## 2013-03-15 DIAGNOSIS — Z86711 Personal history of pulmonary embolism: Secondary | ICD-10-CM | POA: Insufficient documentation

## 2013-03-15 MED ORDER — DIAZEPAM 5 MG PO TABS: 5.0000 mg | ORAL_TABLET | Freq: Three times a day (TID) | ORAL | Status: DC | PRN

## 2013-03-15 MED ORDER — TRAMADOL HCL 50 MG PO TABS: 50.0000 mg | ORAL_TABLET | Freq: Four times a day (QID) | ORAL | Status: DC | PRN

## 2013-03-15 MED ORDER — IBUPROFEN 400 MG PO TABS
600.0000 mg | ORAL_TABLET | Freq: Once | ORAL | Status: DC
  Filled 2013-03-15: qty 2

## 2013-03-15 MED ORDER — OXYCODONE-ACETAMINOPHEN 5-325 MG PO TABS
1.0000 | ORAL_TABLET | Freq: Once | ORAL | Status: DC
  Filled 2013-03-15: qty 1

## 2013-03-15 NOTE — ED Notes (Addendum)
Pt c/o left axilla pain that started last night, was recently discharged 5 days ago from hospital with diagnosis of PE and dvt of left upper arm, is concerned that the pain has returned in left upper arm and also because she is having problems with anxiety, symptoms of anxiety, dizziness increased after taking her dose of  50 mg zoloft last night. Pt states that she currently takes two doses of Lovenox a day for the blood clot,

## 2013-03-15 NOTE — ED Provider Notes (Signed)
CSN: [id number]     Arrival date & time 03/15/13  1348 History  This chart was scribed for Virgel Manifold, MD by Jenne Campus, ED Scribe. This patient was seen in room APA07/APA07 and the patient's care was started at 4:00 PM.   Chief Complaint  Patient presents with  . Arm Pain     The history is provided by the patient. No language interpreter was used.    HPI Comments: Victoria Stein is a 43 y.o. female who presents to the Emergency Department complaining of constant, waxing and waning left upper arm pain for the past few weeks from a known DVT. Pt states that she was having left-sided facial swelling intermittently which she was evaluated by her dentist for. She had wisdom teeth removed without improvement. CT scan was negative at the time. She states that she was eventually diagnosed with a "parotid gland tumor". The tumor was removed from the parotid gland 50/53/97  with no complications at the time. She states that she then began to have left arm pain. she called her called ENT and was told to elevate and use hot compresses. Her left arm then became red and swollen. She was diagnosed with DVT and started on xarelto for 5 days one week ago. She states that the swelling and redness improved, but she was seen in the ED on 03/07/13 for ongoing left upper arm pain. She was told that she was on the proper DVT treatment and an U/S wasn't needed. That night she started having chest pressure. She took one diazepam without improvement. She was seen in the ED early the next morning and was diagnosed with a PE on a CT scan. She was admitted one week ago, 03/08/13 for the PE. She was started on 50 mg Lovenox BID and denies any missed doses. She was told that the arm DVT should resolve but to seek re-evaluation if the arm still hurt after taking Tylenol. She reports that she began having pain in the left axilla and left wrist pain last night. She states that she took 50 mg Zoloft prescribed by OB-GYN for  depression along with 500 mg Tylenol and 0.5 mg Xanax for her anxiety with no improvement today. She also reports that she had left-sided facial swelling, stabbing midline back pain and nausea last night as well. She is here for DVT re-evaluation fearing that a piece of the clot has again broken off. She also reports that she was told that if the Lovenox did not improve her sxs, the DVT could get bigger. She denies any SOB, leg/calf pain or leg swelling.   Past Medical History  Diagnosis Date  . Normal cardiac stress test 2009    normal coronary CT scan  . Parotid mass 02/2013     Benign; status post resection by Dr. Constance Holster  . DVT of upper extremity (deep vein thrombosis) 02/2013    Associated with IV needle  . Acute pulmonary embolism 03/08/2013  . Microcytic anemia 03/09/2013    Ferritin 8   . Chronic anxiety   . Diastolic dysfunction 06/12/3417    Grade 2   Past Surgical History  Procedure Laterality Date  . Wisdom tooth extraction    . Parotid gland tumor excision     Family History  Problem Relation Age of Onset  . Diabetes Mother   . Heart disease Father    History  Substance Use Topics  . Smoking status: Never Smoker   . Smokeless tobacco: Not on file  .  Alcohol Use: No   No OB history provided.  Review of Systems  HENT: Positive for facial swelling (now resolved).   Respiratory: Negative for shortness of breath.   Cardiovascular: Positive for chest pain (now resolved ). Negative for leg swelling.  Gastrointestinal: Positive for nausea. Negative for vomiting.  Musculoskeletal: Positive for back pain (now resolved ) and myalgias (left upper arm/axilla).  Psychiatric/Behavioral: The patient is nervous/anxious.   All other systems reviewed and are negative.      Allergies  Amoxicillin  Home Medications   Current Outpatient Rx  Name  Route  Sig  Dispense  Refill  . acetaminophen (TYLENOL) 500 MG tablet   Oral   Take 500 mg by mouth every 6 (six) hours as needed.  pain         . ALPRAZolam (XANAX) 0.5 MG tablet   Oral   Take 1 tablet (0.5 mg total) by mouth every 6 (six) hours as needed for anxiety.   20 tablet   0   . enoxaparin (LOVENOX) 60 MG/0.6ML injection   Subcutaneous   Inject 0.6 mLs (60 mg total) into the skin every 12 (twelve) hours.   60 Syringe   6   . famotidine (PEPCID) 10 MG tablet   Oral   Take 1 tablet (10 mg total) by mouth 2 (two) times daily.         . sertraline (ZOLOFT) 50 MG tablet   Oral   Take 50 mg by mouth daily.         . ondansetron (ZOFRAN) 4 MG tablet   Oral   Take 1 tablet (4 mg total) by mouth every 6 (six) hours as needed for nausea.   20 tablet   0    Triage Vitals: BP 126/69  Pulse 103  Temp(Src) 99.2 F (37.3 C) (Oral)  Resp 18  Ht 5\' 3"  (1.6 m)  Wt 139 lb (63.05 kg)  BMI 24.63 kg/m2  SpO2 99%  LMP 02/24/2013  Physical Exam  Nursing note and vitals reviewed. Constitutional: She is oriented to person, place, and time. She appears well-developed and well-nourished. No distress.  HENT:  Head: Normocephalic and atraumatic.  Eyes: Conjunctivae and EOM are normal.  Neck: Neck supple. No tracheal deviation present.  Cardiovascular: Normal rate.   Pulmonary/Chest: Effort normal. No respiratory distress.  Abdominal: There is no tenderness.  Musculoskeletal: Normal range of motion. She exhibits no edema and no tenderness.  No calf tenderness or swelling. LUE grossly normal as compared to the RUE. Palpable radial pulses. No Homan's.   Neurological: She is alert and oriented to person, place, and time.  Skin: Skin is warm and dry.  Psychiatric: Her behavior is normal. Her mood appears anxious.    ED Course  Procedures (including critical care time)  DIAGNOSTIC STUDIES: Oxygen Saturation is 99% on RA, normal by my interpretation.    COORDINATION OF CARE: 4:10 PM-Had lengthy discussion with pt that she is on the proper DVT tx and that her sxs are mostly anxiety related. DVT/PE tx is  prolonged and takes between 6 months to one year with ongoing sxs. Explained how Lovenox helps the body break down the clot. Informed pt that an U/S would not help the situation as the PE is not suggestive of DVT complications. Advised that use of the arm will not interfere with the DVT; routine activity/normal daily living activity is fine. Discussed discharge plan which includes pain medication and doubling her one of her anxiety medications but not  together with pt and pt agreed to plan. Also advised pt to follow up as needed with OB-GYN for anxiety medication discussion and pt agreed. Addressed symptoms to return for with pt.   Labs Review Labs Reviewed - No data to display Imaging Review No results found.   EKG Interpretation None      MDM   Final diagnoses:  Arm pain  Anxiety   43 year old female with left upper extremity pain. Recently diagnosis of DVT/PE. Patient is extremely anxious. Extremely strong suspicion for a strong anxiety component. No clinical evidence of propagation of her upper extremity venous thrombosis. Reassurance was provided. Increase when necessary Xanax as needed for a few days. Return precautions were discussed. Outpatient followup.  I personally preformed the services scribed in my presence. The recorded information has been reviewed is accurate. Virgel Manifold, MD.     Virgel Manifold, MD 03/22/13 2149

## 2013-03-18 ENCOUNTER — Emergency Department (HOSPITAL_COMMUNITY): Payer: BC Managed Care – PPO

## 2013-03-18 ENCOUNTER — Encounter (HOSPITAL_COMMUNITY): Payer: Self-pay | Admitting: Emergency Medicine

## 2013-03-18 ENCOUNTER — Emergency Department (HOSPITAL_COMMUNITY)
Admission: EM | Admit: 2013-03-18 | Discharge: 2013-03-18 | Disposition: A | Discharge status: Home / Self Care | Payer: BC Managed Care – PPO | Attending: Emergency Medicine | Admitting: Emergency Medicine

## 2013-03-18 DIAGNOSIS — F411 Generalized anxiety disorder: Secondary | ICD-10-CM | POA: Insufficient documentation

## 2013-03-18 DIAGNOSIS — I82629 Acute embolism and thrombosis of deep veins of unspecified upper extremity: Secondary | ICD-10-CM

## 2013-03-18 DIAGNOSIS — R110 Nausea: Secondary | ICD-10-CM | POA: Insufficient documentation

## 2013-03-18 DIAGNOSIS — Z880 Allergy status to penicillin: Secondary | ICD-10-CM | POA: Insufficient documentation

## 2013-03-18 DIAGNOSIS — I808 Phlebitis and thrombophlebitis of other sites: Secondary | ICD-10-CM

## 2013-03-18 DIAGNOSIS — Z7901 Long term (current) use of anticoagulants: Secondary | ICD-10-CM | POA: Insufficient documentation

## 2013-03-18 DIAGNOSIS — Z79899 Other long term (current) drug therapy: Secondary | ICD-10-CM | POA: Insufficient documentation

## 2013-03-18 DIAGNOSIS — Z86711 Personal history of pulmonary embolism: Secondary | ICD-10-CM | POA: Insufficient documentation

## 2013-03-18 DIAGNOSIS — I82619 Acute embolism and thrombosis of superficial veins of unspecified upper extremity: Secondary | ICD-10-CM | POA: Insufficient documentation

## 2013-03-18 DIAGNOSIS — R42 Dizziness and giddiness: Secondary | ICD-10-CM | POA: Insufficient documentation

## 2013-03-18 DIAGNOSIS — D509 Iron deficiency anemia, unspecified: Secondary | ICD-10-CM | POA: Insufficient documentation

## 2013-03-18 LAB — BASIC METABOLIC PANEL
BUN: 3 mg/dL — AB (ref 6–23)
CALCIUM: 9.2 mg/dL (ref 8.4–10.5)
CO2: 26 meq/L (ref 19–32)
Chloride: 102 mEq/L (ref 96–112)
Creatinine, Ser: 0.65 mg/dL (ref 0.50–1.10)
GFR calc Af Amer: >90 mL/min (ref >90)
GFR calc non Af Amer: >90 mL/min (ref >90)
Glucose, Bld: 100 mg/dL — ABNORMAL HIGH (ref 70–99)
Potassium: 3.9 mEq/L (ref 3.7–5.3)
SODIUM: 142 meq/L (ref 137–147)

## 2013-03-18 LAB — CBC
HEMATOCRIT: 32.4 % — AB (ref 36.0–46.0)
HEMOGLOBIN: 10 g/dL — AB (ref 12.0–15.0)
MCH: 23.9 pg — AB (ref 26.0–34.0)
MCHC: 30.9 g/dL (ref 30.0–36.0)
MCV: 77.3 fL — ABNORMAL LOW (ref 78.0–100.0)
Platelets: 222 10*3/uL (ref 150–400)
RBC: 4.19 MIL/uL (ref 3.87–5.11)
RDW: 17.6 % — ABNORMAL HIGH (ref 11.5–15.5)
WBC: 6.3 10*3/uL (ref 4.0–10.5)

## 2013-03-18 MED ORDER — ENOXAPARIN SODIUM 100 MG/ML ~~LOC~~ SOLN
60.0000 mg | Freq: Once | SUBCUTANEOUS | Status: AC
  Administered 2013-03-18: 60 mg via SUBCUTANEOUS
  Filled 2013-03-18: qty 1

## 2013-03-18 MED ORDER — MECLIZINE HCL 25 MG PO TABS: 25.0000 mg | ORAL_TABLET | Freq: Three times a day (TID) | ORAL | Status: DC | PRN

## 2013-03-18 MED ORDER — ALPRAZOLAM 0.5 MG PO TABS: 0.5000 mg | ORAL_TABLET | Freq: Three times a day (TID) | ORAL | Status: DC | PRN

## 2013-03-18 MED ORDER — ENOXAPARIN SODIUM 60 MG/0.6ML ~~LOC~~ SOLN
60.0000 mg | Freq: Once | SUBCUTANEOUS | Status: DC
  Filled 2013-03-18: qty 0.6

## 2013-03-18 MED ORDER — ALPRAZOLAM 0.25 MG PO TABS
0.5000 mg | ORAL_TABLET | Freq: Once | ORAL | Status: AC
  Administered 2013-03-18: 0.5 mg via ORAL
  Filled 2013-03-18: qty 2

## 2013-03-18 NOTE — ED Notes (Signed)
PT reports hx of L arm DVT diagnosed at [facility] in January 2015. PT also had R parotid gland tumor removed 02/23/13 and some teeth pulled. PT was on Xarelto and diagnosed with 2nd clot in R lung-was put on lovenox injections BID. PT has experienced vertigo, dizziness, weakness, and nausea the past 3-4 days. Primary care provider asked PT to come back in for CT and MRI today to r/o further clot formation.

## 2013-03-18 NOTE — ED Notes (Signed)
Patient transported to MRI 

## 2013-03-18 NOTE — ED Notes (Signed)
Pt states she was just started on lovenox for blood clot in her arm and lungs after surgery. This week she has been more dizzy and her BP has been low so her doctor wanted her to come to ED to make sure she has not developed more blood clots.

## 2013-03-18 NOTE — ED Notes (Signed)
Still c/o blurred vision.  Would like her lovenox on time. Pt. Anxious and is encouraged to relax.

## 2013-03-18 NOTE — ED Notes (Signed)
Family at bedside. 

## 2013-03-18 NOTE — ED Notes (Signed)
Vital signs stable. 

## 2013-03-18 NOTE — Progress Notes (Signed)
VASCULAR LAB PRELIMINARY  PRELIMINARY  PRELIMINARY  PRELIMINARY  Left upper extremity venous duplex completed.    Preliminary report:  Left:  No evidence of acute DVT or superficial thrombosis.  Previously noted brachial DVT and basilic thrombus appear resolved.  There is subacute thrombus in the cephalic vein from the wrist to mid forearm.    Malu Pellegrini, RVT 03/18/2013, 6:02 PM

## 2013-03-18 NOTE — ED Provider Notes (Signed)
CSN: [id number]     Arrival date & time 03/18/13  1605 History   First MD Initiated Contact with Patient 03/18/13 1616     Chief Complaint  Patient presents with  . Hypotension     (Consider location/radiation/quality/duration/timing/severity/associated sxs/prior Treatment) HPI Comments: Pt seen multiple times in ED in past few weeks. Pt initially had dental pain and was found to be a parotid tumor which she had surgery for. As a result of the surgery, she developed a LUE DVT and PE and was admitted on 03/03 in this facility and started on Lovenox. Pt states she does not have chest pains or SOB, but does have some nausea from time to time and non vertiginous dizziness waxing and waning. Pt went to ER on 03/10 and was discharged as sxs were felt to be from anxiety and no signs of increased clot burden for PE. Pt went to PCP today and she states her "pressure was low" but on clarifying with patient she confidently states 108/54 and states that her PCP wanted her to come to the ED for a MRI to make sure "she was not forming clots." She has no increased swelling, no chest pain, no SOB, no hemoptysis, no abd pain, no n/v/d, no f/c.   Past Medical History  Diagnosis Date  . Normal cardiac stress test 2009    normal coronary CT scan  . Parotid mass 02/2013     Benign; status post resection by Dr. Constance Holster  . DVT of upper extremity (deep vein thrombosis) 02/2013    Associated with IV needle  . Acute pulmonary embolism 03/08/2013  . Microcytic anemia 03/09/2013    Ferritin 8   . Chronic anxiety   . Diastolic dysfunction 07/09/5327    Grade 2   Past Surgical History  Procedure Laterality Date  . Wisdom tooth extraction    . Parotid gland tumor excision     Family History  Problem Relation Age of Onset  . Diabetes Mother   . Heart disease Father    History  Substance Use Topics  . Smoking status: Never Smoker   . Smokeless tobacco: Not on file  . Alcohol Use: No   OB History   Grav Para Term  Preterm Abortions TAB SAB Ect Mult Living                 Review of Systems  Constitutional: Negative for fever, activity change and appetite change.  HENT: Negative for congestion.   Eyes: Negative for discharge, redness and itching.  Respiratory: Negative for cough and shortness of breath.   Cardiovascular: Negative for chest pain and palpitations.  Gastrointestinal: Positive for nausea. Negative for vomiting and constipation.  Genitourinary: Negative for decreased urine volume and difficulty urinating.  Skin: Negative for rash.  Neurological: Positive for dizziness. Negative for syncope.  Psychiatric/Behavioral: The patient is nervous/anxious.       Allergies  Amoxicillin  Home Medications   Current Outpatient Rx  Name  Route  Sig  Dispense  Refill  . acetaminophen (TYLENOL) 160 MG/5ML liquid   Oral   Take 325 mg by mouth every 4 (four) hours as needed for pain.         Marland Kitchen acetaminophen (TYLENOL) 500 MG tablet   Oral   Take 500 mg by mouth every 6 (six) hours as needed for moderate pain. pain         . ALPRAZolam (XANAX) 0.5 MG tablet   Oral   Take 1 tablet (0.5 mg  total) by mouth every 6 (six) hours as needed for anxiety.   20 tablet   0   . enoxaparin (LOVENOX) 60 MG/0.6ML injection   Subcutaneous   Inject 0.6 mLs (60 mg total) into the skin every 12 (twelve) hours.   60 Syringe   6   . ondansetron (ZOFRAN) 4 MG tablet   Oral   Take 1 tablet (4 mg total) by mouth every 6 (six) hours as needed for nausea.   20 tablet   0   . ALPRAZolam (XANAX) 0.5 MG tablet   Oral   Take 1 tablet (0.5 mg total) by mouth 3 (three) times daily as needed for anxiety.   10 tablet   0   . meclizine (ANTIVERT) 25 MG tablet   Oral   Take 1 tablet (25 mg total) by mouth 3 (three) times daily as needed for dizziness.   28 tablet   0    BP 98/81  Pulse 83  Temp(Src) 99.1 F (37.3 C) (Oral)  Resp 16  Ht 5\' 3"  (1.6 m)  Wt 136 lb 11.2 oz (62.007 kg)  BMI 24.22 kg/m2   SpO2 100%  LMP 02/24/2013 Physical Exam  Constitutional: She is oriented to person, place, and time. She appears well-developed and well-nourished. No distress.  HENT:  Head: Normocephalic and atraumatic.  Mouth/Throat: Oropharynx is clear and moist.  Eyes: Conjunctivae and EOM are normal. Pupils are equal, round, and reactive to light. Right eye exhibits no discharge. Left eye exhibits no discharge. No scleral icterus.  Neck: Normal range of motion. Neck supple.  Cardiovascular: Normal rate, regular rhythm and intact distal pulses.  Exam reveals no gallop and no friction rub.   No murmur heard. Pulmonary/Chest: Effort normal and breath sounds normal. No respiratory distress. She has no wheezes. She has no rales.  Abdominal: Soft. She exhibits no distension and no mass. There is no tenderness.  Musculoskeletal: Normal range of motion.  Neurological: She is alert and oriented to person, place, and time. No cranial nerve deficit. She exhibits normal muscle tone. Coordination normal.  5/5 strength in all exts, normal sensation in all exts, 2+ DTRs in b/l patella and brachioradilias, F2N and H2S negative, ambulatory without ataxia  Skin: She is not diaphoretic.  Psychiatric:  Very anxious, often tearing up during exam    ED Course  Procedures (including critical care time) Labs Review Labs Reviewed  CBC - Abnormal; Notable for the following:    Hemoglobin 10.0 (*)    HCT 32.4 (*)    MCV 77.3 (*)    MCH 23.9 (*)    RDW 17.6 (*)    All other components within normal limits  BASIC METABOLIC PANEL - Abnormal; Notable for the following:    Glucose, Bld 100 (*)    BUN 3 (*)    All other components within normal limits   Imaging Review No results found.   EKG Interpretation   Date/Time:  Friday March 18 2013 16:10:24 EDT Ventricular Rate:  96 PR Interval:  108 QRS Duration: 84 QT Interval:  340 QTC Calculation: 429 R Axis:   72 Text Interpretation:  Sinus rhythm with short PR  No significant change  since last tracing Confirmed by GOLDSTON  MD, SCOTT ([id number]) on 03/18/2013  4:29:27 PM      Ultrasound of LUE: VASCULAR LAB  PRELIMINARY PRELIMINARY PRELIMINARY PRELIMINARY  Left upper extremity venous duplex completed.  Preliminary report: Left: No evidence of acute DVT or superficial thrombosis. Previously noted brachial  DVT and basilic thrombus appear resolved. There is subacute thrombus in the cephalic vein from the wrist to mid forearm.  CESTONE, HELENE, RVT  03/18/2013, 6:02 PM   MDM   Pt is a 43 y.o. WF w/ hx of DVT in LUE and PE diagnosed in 03/03 with concern for spreading PE. PT saw PCP today who referred her here. Pt denies chest pain, SOB, cough, but endorses intermittent dizziness on exam. Also has nausea as well. She has had these sxs she states since [place] and notably was in ED on 03/10 where she was discharged. Pt is extremely anxious, states she has ran out of her Xanax and is worried about it spreading. She states her  PCP sent her here for "MRIs". Pt is AFVSS, no tachycardia or hypoxia, no resp distress, very anxious. LUE is not edematous or tender. Based on patient's exam, very low suspicion for increased clot burden of PE with no tachy, no hypoxia, no chest pain or SOB. Suspect very large anxiety component. Will check basic labs and get a LUE and reassess. Pt with no dizziness while in ED. Labs WNL. U/s with no evidence of DVT. Shared news with patient. Pt intially concerned for hypotension but patient has had no hypotension while in the ED and her BP in the office was not hypotensive as well. Will give trial of antivert for dizziness and give small refill of xanax until she can see her PCP. Discharged. Care of case d/w my attending.  Final diagnoses:  DVT of upper extremity (deep vein thrombosis)   Discharged   Sol Passer, MD 03/18/13 [phone]

## 2013-03-18 NOTE — Discharge Instructions (Signed)
Venous Thromboembolism Venous thromboembolism (VTE) is a condition where a blood clot (thrombus) develops in the body. A thrombus usually occurs in a deep vein in the leg or pelvis but can occur in an upper extremity. Sometimes pieces of the thrombus can break off from its original place of development and travel through the bloodstream to other parts of the body. When a thrombus breaks off and travels through the bloodstream, it is called an embolism. The embolism can block the blood flow in the blood vessels of other organs. There are two two serious types of VTE:  Deep vein thrombosis (DVT). A DVT is a thrombus that usually occurs in a deep vein of the lower legs, pelvis, or in an upper extremity.  Pulmonary embolism (PE). A PE occurs when an embolism has formed and traveled to the lungs. A PE can block or decrease the blood flow in one or both lungs.  Venous thromboembolism is a serious health condition that can cause disability or death. It is very important to not ignore symptoms or delay treatment.  CAUSES   A blood clot can form in a vein from different conditions. A blood clot can develop due to:  Blood flow within a vein that is sluggish or very slow.  Medical conditions that make the blood clot easily.  Vein damage. RISK FACTORS Risk factors can increase your risk of developing a blood clot. Risk factors can include:  Smoking.  Obesity.  Age.  Immobility or sedentary lifestyle.  Sitting or standing for long periods of time.  Chronic or long-term bedrest.  Medical or past history of blood clots.  Family history of blood clots.  Hip, leg, or pelvis injury or trauma.  Major surgery, especially surgery on the hip, knee, or abdomen.  Pregnancy and childbirth.  Birth control pills and hormone replacement therapy.  Medical conditions such as  Peripheral vascular disease (PVD).  Diabetes.  Cancer. SYMPTOMS  Symptoms of VTE can depend on where the clot is located  and if the clot breaks off and travels to another organ. Sometimes, there may be no symptoms.   DVT symptoms can include:  Swelling of the leg or arm, especially on one side.  Warmth and redness of the leg or arm, especially on one side.  Pain in an arm or leg. Leg pain may be more noticeable or worse when standing or walking.  PE symptoms can include:  Shortness of breath.  Coughing.  Coughing up blood or blood-tinged mucus (hemoptysis).  Chest pain or chest pain with deep breaths (pleuritic chest pain).  Apprehension, anxiety, or a feeling of impending doom.  Rapid heartbeat. A PE is a medical emergency. Call your local emergency services (911 in [place] if you have these symptoms. DIAGNOSIS  A venous thromboembolism is diagnosed by:  Looking at your medical history and risk factors. Your caregiver will perform a physical exam.  Blood tests, including blood work of how your blood clots.  Imaging tests that can detect a blood clot may be ordered. These can include:  Ultrasonography.  Computed Tomography (CT) scan.  Magnetic Resonance Imaging (MRI).  Echocardiography.  Electrocardiography. TREATMENT  Initial treatment: When a venous thromboembolism has been confirmed, initial treatment consists of using blood thinning (anticoagulant) medicines. Anticoagulant medicines affect how your blood clots and can cause bleeding. Therefore, your blood clotting times are monitored by blood tests called prothrombin time (PT) and International Normalized Ratio (INR) when you are on anticoagulants. Typically, the anticoagulants are intravenous (IV) heparin and  warfarin. IV heparin is normally started right away because IV heparin has a rapid onset of action and thins the blood quickly. Warfarin is also started with IV heparin therapy. Warfarin has a slower onset of action and takes longer to work. This overlap therapy of IV heparin and oral warfarin is continued until PT and INR levels are  therapeutic. After the PT and INR levels are therapeutic, IV heparin is discontinued and you are maintained on warfarin.  Other treatment options:  Catheter-directed thrombolysis. This is a clot-busting therapy for a DVT in which a small, flexible hollow tube (catheter) is threaded to the blood clot inside the vein. A clot-busting drug (thrombolytic) is then infused through the catheter. The thrombolytic helps to break up the clot in the vein and restore blood flow.  Direct thrombin inhibitor (DTI) medicine. A DTI is an anticoagulant that slows blood clotting. It is given through an IV.  If you cannot take an anticoagulant, a filter called an inferior vena cava filter (IVC filter) can be placed. The IVC filter is placed in a large vein, in either your leg or abdomen. An IVC filter is left in the vein permanently. The IVC filter can help prevent blood clots from going to your lungs.  Surgery. Blood clots may need to be removed surgically if other treatment options are not working or cannot be used. Types of surgery can include:  Thrombectomy.  Embolectomy.  Venous stenting.  Pain medicine (analgesic). Medicine to control pain is given in addition to the above treatment options. Home treatment:  Continued treatment at home consists of taking either warfarin or under-the-skin (subcutaneous) injections of an anticoagulant. PREVENTION   In-hospital prevention:  Activity. Getting out of bed and walking while you are in the hospital can help prevent blood clots.  Medicines may be given to help prevent blood clots.  Sequential compression device (SCD). A SCD can help prevent blood clots in the lower legs. A compression sleeve is wrapped around each of your legs. The tubing of the sleeve is connected to a machine that pumps air into the compression sleeve. The pumping action of the sleeve helps circulate the blood in your legs.  Compression stockings. Compression stockings are tight, elastic  stockings that apply pressure to the lower legs and help prevent blood from pooling in the lower legs. Compression stockings are sometimes used with SCDs.  General prevention:  Exercise regularly if you are able.  Avoid sitting or lying in bed for long periods of time without moving the legs.  Do not smoke. If you smoke, quit. Ask your caregiver for help.  Avoid exposure to smoke.  Maintain a healthy weight.  Women over the age of 8 should consider the risk of blood clots while taking birth control pills or hormone replacement therapy.  Long distance travel along with prolonged sitting and standing can increase the risk of a DVT. Exercise your legs by walking or by pumping your leg muscles every hour. HOME CARE INSTRUCTIONS   Take all medicines prescribed by your caregiver. Follow the directions carefully.  Warfarin. Most people will continue taking warfarin. Your caregiver will advise you on the length of treatment (usually 3 to 6 months, sometimes lifelong).  Too much and too little warfarin are both dangerous. Too much warfarin increases the risk of bleeding. Too little warfarin continues to allow the risk for blood clots. While taking warfarin, you will need to have regular blood tests to measure your blood clotting time. These blood tests usually  include both the PT and INR tests. The PT and INR results allow your caregiver to adjust your dose of warfarin. The dose can change for many reasons. It is critically important that you take warfarin exactly as prescribed, and that you have your PT and INR levels drawn exactly as directed.  Many foods, especially foods high in vitamin K can interfere with warfarin and affect the PT and INR results. Foods high in vitamin K include spinach, kale, broccoli, cabbage, collard and turnip greens, brussels sprouts, peas, cauliflower, seaweed, and parsley as well as beef and pork liver, green tea, and soybean oil. You should eat a consistent amount of  foods high in vitamin K. Avoid major changes in your diet, or notify your caregiver before changing your diet. Arrange a visit with a dietitian to answer your questions.  Many medicines can interfere with warfarin and affect the PT and INR results. You must tell your caregiver about any and all medicines you take, this includes all vitamins and supplements. Be especially cautious with aspirin and anti-inflammatory medicines. Do not take or discontinue any prescribed or over-the-counter medicine except on the advice of your caregiver or pharmacist.  Warfarin can have side effects, such as excessive bruising or bleeding. You will need to hold pressure over cuts for longer than usual. Your caregiver or pharmacist will discuss other potential side effects.  Avoid sports or activities that may cause injury or bleeding.  Be mindful when shaving, flossing your teeth, or handling sharp objects.  Alcohol can change the body's ability to handle warfarin. It is best to avoid alcoholic drinks or consume only very small amounts while taking warfarin. Notify your caregiver if you change your alcohol intake.  Notify your dentist or other caregivers before procedures.  Activity. Ask your caregiver how soon you can go back to normal activities if you have had a blood blot.  Exercise. It is very important to exercise and stay active to prevent future blood clots. This is especially important while traveling, sitting, or standing for long periods of time. Exercise your legs by walking or by pumping the muscles frequently.  Compression stockings. You may need to wear compression stockings to help prevent a DVT.  Smoking. If you smoke, quit. Ask your caregiver for help with quitting smoking.  Learn as much as you can about VTE. Educating yourself can help prevent VTE from reoccurring.  Wear a medical alert bracelet or carry a medical alert card. SEEK MEDICAL CARE IF:   You feel faint or dizzy.  You feel  rapid or skipped heartbeats.  You feel weaker or more tired than usual.  You feel you are not getting better in the time expected.  You believe you are having side effects from medicine.  You have joint pain.  You have abdominal pain.  You have new or increased bruising. SEEK IMMEDIATE MEDICAL CARE IF:   You vomit bright red blood or your vomit has a coffee ground type appearance.  You have bowel movements that have bright red blood or are dark or tarry in appearance.  You have bleeding from your rectum.  You have pink or bloody urine.  You develop breathing problems such as shortness of breath or pain with breathing.  You are coughing up blood.  You develop warmth, swelling, or redness in an arm or a leg.  You have chest pain.  You have a sudden, unexplained severe headache.  You have a cut that does not stop bleeding after 10 minutes.  You have a nosebleed that does not stop bleeding after 10 minutes. Document Released: 10/20/2008 Document Revised: 06/24/2011 Document Reviewed: 06/04/2011 [facility] Patient Information 2014 [place], [place].

## 2013-03-20 NOTE — ED Provider Notes (Signed)
I saw and evaluated the patient, reviewed the resident's note and I agree with the findings and plan.   EKG Interpretation   Date/Time:  Friday March 18 2013 16:10:24 EDT Ventricular Rate:  96 PR Interval:  108 QRS Duration: 84 QT Interval:  340 QTC Calculation: 429 R Axis:   72 Text Interpretation:  Sinus rhythm with short PR No significant change  since last tracing Confirmed by [place] ([id number]) on 03/18/2013  4:29:27 PM      Patient very anxious, which I believe is driving most of her symptoms. She is requesting repeat u/s of arm because it hurts more, no swelling. U/S is improved. No chest pain/dyspnea, and while patient is worried about new PE there are no objective indication for CTA (no CP/SOB, hypoxia or tachycardia). Discussed return precautions, including CNS symptoms. As she was being discharged she states she's having blurry vision bilaterally with dizziness. This has been intermittent and recurrent over past several days. MRI obtained, shows no CVA or other acute abnormality. Neuro testing by me shows normal cerebellar testing, normal gait, CN 2-12 grossly intact, and 5/5 strength in all 4 extremities. Will discharge back to care of her PCP.   Ephraim Hamburger, MD 03/20/13 [phone]

## 2013-03-21 ENCOUNTER — Telehealth (HOSPITAL_COMMUNITY): Payer: Self-pay | Admitting: Hematology and Oncology

## 2013-03-21 NOTE — Telephone Encounter (Signed)
PC TO [employer] [phone] ?'D IF [facility] Q0138 REQUIRES AUTH PER [place] IT DOES NOT. PTS DED/OOP HAS BEEN MET SO INFUSION WILL BE PAID AT 100% OF ALLOWABLE.

## 2013-03-21 NOTE — Telephone Encounter (Signed)
[employer] Victoria Stein CALL REF #[id number] FOR PRIOR NOTE

## 2013-03-23 ENCOUNTER — Encounter (HOSPITAL_COMMUNITY): Payer: Self-pay

## 2013-03-23 ENCOUNTER — Encounter (HOSPITAL_COMMUNITY): Payer: BC Managed Care – PPO | Attending: Hematology and Oncology

## 2013-03-23 ENCOUNTER — Encounter (HOSPITAL_BASED_OUTPATIENT_CLINIC_OR_DEPARTMENT_OTHER): Payer: BC Managed Care – PPO

## 2013-03-23 VITALS — BP 102/66 | HR 64

## 2013-03-23 VITALS — BP 98/50 | HR 102 | Temp 97.4°F | Resp 18 | Ht 63.0 in | Wt 138.0 lb

## 2013-03-23 DIAGNOSIS — Z86711 Personal history of pulmonary embolism: Secondary | ICD-10-CM | POA: Insufficient documentation

## 2013-03-23 DIAGNOSIS — D509 Iron deficiency anemia, unspecified: Secondary | ICD-10-CM

## 2013-03-23 DIAGNOSIS — Z86718 Personal history of other venous thrombosis and embolism: Secondary | ICD-10-CM | POA: Insufficient documentation

## 2013-03-23 DIAGNOSIS — I82629 Acute embolism and thrombosis of deep veins of unspecified upper extremity: Secondary | ICD-10-CM

## 2013-03-23 DIAGNOSIS — I2699 Other pulmonary embolism without acute cor pulmonale: Secondary | ICD-10-CM

## 2013-03-23 DIAGNOSIS — Z09 Encounter for follow-up examination after completed treatment for conditions other than malignant neoplasm: Secondary | ICD-10-CM | POA: Insufficient documentation

## 2013-03-23 DIAGNOSIS — F411 Generalized anxiety disorder: Secondary | ICD-10-CM | POA: Insufficient documentation

## 2013-03-23 DIAGNOSIS — Z7901 Long term (current) use of anticoagulants: Secondary | ICD-10-CM

## 2013-03-23 LAB — CBC WITH DIFFERENTIAL/PLATELET
BASOS PCT: 0 % (ref 0–1)
Basophils Absolute: 0 10*3/uL (ref 0.0–0.1)
EOS PCT: 1 % (ref 0–5)
Eosinophils Absolute: 0 10*3/uL (ref 0.0–0.7)
HEMATOCRIT: 32.9 % — AB (ref 36.0–46.0)
Hemoglobin: 10.3 g/dL — ABNORMAL LOW (ref 12.0–15.0)
Lymphocytes Relative: 26 % (ref 12–46)
Lymphs Abs: 1.8 10*3/uL (ref 0.7–4.0)
MCH: 24.5 pg — AB (ref 26.0–34.0)
MCHC: 31.3 g/dL (ref 30.0–36.0)
MCV: 78.1 fL (ref 78.0–100.0)
MONO ABS: 0.7 10*3/uL (ref 0.1–1.0)
Monocytes Relative: 11 % (ref 3–12)
Neutro Abs: 4.3 10*3/uL (ref 1.7–7.7)
Neutrophils Relative %: 62 % (ref 43–77)
Platelets: 234 10*3/uL (ref 150–400)
RBC: 4.21 MIL/uL (ref 3.87–5.11)
RDW: 17 % — AB (ref 11.5–15.5)
WBC: 6.9 10*3/uL (ref 4.0–10.5)

## 2013-03-23 LAB — D-DIMER, QUANTITATIVE: D-Dimer, Quant: 0.3 ug/mL-FEU (ref 0.00–0.48)

## 2013-03-23 MED ORDER — SODIUM CHLORIDE 0.9 % IJ SOLN: 10.0000 mL | INTRAMUSCULAR | Status: DC | PRN

## 2013-03-23 MED ORDER — SODIUM CHLORIDE 0.9 % IV SOLN
1020.0000 mg | Freq: Once | INTRAVENOUS | Status: AC
  Administered 2013-03-23: 1020 mg via INTRAVENOUS
  Filled 2013-03-23: qty 34

## 2013-03-23 MED ORDER — SODIUM CHLORIDE 0.9 % IV SOLN
Freq: Once | INTRAVENOUS | Status: AC
  Administered 2013-03-23: 15:00:00 via INTRAVENOUS

## 2013-03-23 MED ORDER — OXYCODONE HCL 5 MG PO TABS: ORAL_TABLET | ORAL | Status: DC

## 2013-03-23 NOTE — Patient Instructions (Signed)
.  [facility] Discharge Instructions  RECOMMENDATIONS MADE BY THE CONSULTANT AND ANY TEST RESULTS WILL BE SENT TO YOUR REFERRING PHYSICIAN.  EXAM FINDINGS BY THE PHYSICIAN TODAY AND SIGNS OR SYMPTOMS TO REPORT TO CLINIC OR PRIMARY PHYSICIAN: Exam and findings as discussed by Dr. Barnet Glasgow.  MEDICATIONS PRESCRIBED:  Oxycodone 5 mg Take 1 or 2 tablets every 4 hours as needed for pain.   INSTRUCTIONS/FOLLOW-UP: 1 week office visit  Thank you for choosing [facility] to provide your oncology and hematology care.  To afford each patient quality time with our providers, please arrive at least 15 minutes before your scheduled appointment time.  With your help, our goal is to use those 15 minutes to complete the necessary work-up to ensure our physicians have the information they need to help with your evaluation and healthcare recommendations.    Effective January 1st, 2014, we ask that you re-schedule your appointment with our physicians should you arrive 10 or more minutes late for your appointment.  We strive to give you quality time with our providers, and arriving late affects you and other patients whose appointments are after yours.    Again, thank you for choosing [facility].  Our hope is that these requests will decrease the amount of time that you wait before being seen by our physicians.       _____________________________________________________________  Should you have questions after your visit to [facility], please contact our office at [phone] between the hours of 8:30 a.m. and 5:00 p.m.  Voicemails left after 4:30 p.m. will not be returned until the following business day.  For prescription refill requests, have your pharmacy contact our office with your prescription refill request.

## 2013-03-23 NOTE — Progress Notes (Signed)
.  Victoria Stein tolerated infusion without problems

## 2013-03-23 NOTE — Progress Notes (Signed)
[facility]  OFFICE PROGRESS NOTE  Purvis Kilts, MD [address] [place] [address]  DIAGNOSIS: DVT of upper extremity (deep vein thrombosis) - Plan: D-dimer, quantitative  Acute pulmonary embolism - Plan: D-dimer, quantitative  Iron deficiency anemia, unspecified - Plan: CBC with Differential, CANCELED: Ferritin  Chief Complaint  Patient presents with  . Brachial vein thrombosis  . Pulmonary embolism  . Anxiety neurosis    CURRENT THERAPY: Lovenox 1 mg per kilogram subcutaneously twice a day  INTERVAL HISTORY: Victoria Stein 43 y.o. female returns for followup of vein thrombosis treated with Xarelto after which  a pulmonary embolism develops symptomatology. Xarelto was discontinued and Lovenox at 1 mg per kilogram twice a day was started. Inpatient evaluation revealed evidence of iron deficiency as well. The patient did have intravenous line in that arm having undergone resection of a parotid tumor prior to the development of the initial thrombosis. The pulmonary embolism was diagnosed on 03/03/2013. Patient is extremely anxious. She was seen by her family physician today and was given a prescription for Lexapro. 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 Unfortunately she developed a superficial phlebitis of the left upper extremity that spread to the left brachial vein nonocclusive leg with an IV in her left wrist. Left arm is still painful but not swollen. She denies any chest pain or PND, orthopnea, or palpitations. She continues on Lovenox 60 mg every 12 hours. She is gravida 2 para 2 Ab0 with no family history of blood clots in younger people. While in the hospital she was found to be iron deficient due to menstrual blood loss. and for that reason is receiving intravenous iron today. Her  last menstrual period was last week lasting 5 days.  MEDICAL HISTORY: Past Medical History  Diagnosis Date  . Normal cardiac stress test 2009    normal coronary CT scan  . Parotid mass 02/2013     Benign; status post resection by Dr. Constance Holster  . DVT of upper extremity (deep vein thrombosis) 02/2013    Associated with IV needle  . Acute pulmonary embolism 03/08/2013  . Microcytic anemia 03/09/2013    Ferritin 8   . Chronic anxiety   . Diastolic dysfunction 06/11/598    Grade 2  . Depression     INTERIM HISTORY: has Acute pulmonary embolism; DVT of upper extremity (deep vein thrombosis); Acute anxiety; Iron deficiency anemia, unspecified; and Diastolic dysfunction on her problem list.  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 She underwent surgery on February 23, 2013 by Dr. Constance Holster and the tumor was found to be benign. During that surgical procedure, which was done in an outpatient facility, she had an IV placed in her left arm. During that stay she noticed that with the IV site was red. Following discharge, the redness spread across the arm to involve part of the upper arm as well. She reported this to the surgeon and staff member who recommended elevation of the left arm and warm compresses, but the symptoms and signs progressed. She was therefore referred to the ED on 03/03/2013 and was diagnosed with a DVT of brachial vein in the left. She was started on Xarelto 15 mg BID x 21 days with transition to 20  mg daily.   On 03/07/2013, she came in to the emergency department with chest pressure. She was reassured and discharged from the ED in light of her anxiety. And, then at 10 PM on 03/07/2013 she started having chest pain in the central part of the chest. It felt like a pressure-like sensation. It was an 8/10 in intensity. She tried taking her diazepam which was prescribed recently by her primary care physician without any relief. She awoke at 1 AM on 03/08/13 with severe chest  pain and was brought to the ED by her husband. CT scan done in the emergency room was consistent with a pulmonary embolism  ALLERGIES:  is allergic to amoxicillin.  MEDICATIONS: has a current medication list which includes the following prescription(s): alprazolam, enoxaparin, escitalopram, ondansetron, and oxycodone.  SURGICAL HISTORY:  Past Surgical History  Procedure Laterality Date  . Wisdom tooth extraction    . Parotid gland tumor excision      FAMILY HISTORY: family history includes Diabetes in her mother; Heart disease in her father.  SOCIAL HISTORY:  reports that she has never smoked. She does not have any smokeless tobacco history on file. She reports that she does not drink alcohol or use illicit drugs.  REVIEW OF SYSTEMS:  Other than that discussed above is noncontributory.  PHYSICAL EXAMINATION: ECOG PERFORMANCE STATUS: 1 - Symptomatic but completely ambulatory  Blood pressure 98/50, pulse 102, temperature 97.4 F (36.3 C), temperature source Oral, resp. rate 18, height 5' 3"  (1.6 m), weight 138 lb (62.596 kg), last menstrual period 02/24/2013.  GENERAL:alert, no distress and comfortable SKIN: skin color, texture, turgor are normal, no rashes or significant lesions EYES: PERLA; Conjunctiva are pink and non-injected, sclera clear OROPHARYNX:no exudate, no erythema on lips, buccal mucosa, or tongue. NECK: supple, thyroid normal size, non-tender, without nodularity. No masses CHEST: Normal AP diameter with no breast masses.Marland Kitchen LYMPH:  no palpable lymphadenopathy in the cervical, axillary or inguinal LUNGS: clear to auscultation and percussion with normal breathing effort HEART: regular rate & rhythm and no murmurs. P2 is tolerable at the apex. ABDOMEN:abdomen soft, non-tender and normal bowel sounds MUSCULOSKELETAL:no cyanosis of digits and no clubbing. Range of motion normal. Left upper extremity with good pulses and no evidence of swelling or redness.Marland Kitchen  NEURO: alert &  oriented x 3 with fluent speech, no focal motor/sensory deficits   LABORATORY DATA: Infusion on 03/23/2013  Component Date Value Ref Range Status  . WBC 03/23/2013 6.9  4.0 - 10.5 K/uL Final  . RBC 03/23/2013 4.21  3.87 - 5.11 MIL/uL Final  . Hemoglobin 03/23/2013 10.3* 12.0 - 15.0 g/dL Final  . HCT 03/23/2013 32.9* 36.0 - 46.0 % Final  . MCV 03/23/2013 78.1  78.0 - 100.0 fL Final  . MCH 03/23/2013 24.5* 26.0 - 34.0 pg Final  . MCHC 03/23/2013 31.3  30.0 - 36.0 g/dL Final  . RDW 03/23/2013 17.0* 11.5 - 15.5 % Final  . Platelets 03/23/2013 234  150 - 400 K/uL Final  . Neutrophils Relative % 03/23/2013 62  43 - 77 % Final  . Neutro Abs 03/23/2013 4.3  1.7 - 7.7 K/uL Final  . Lymphocytes Relative 03/23/2013 26  12 - 46 % Final  . Lymphs Abs 03/23/2013 1.8  0.7 - 4.0 K/uL Final  . Monocytes Relative 03/23/2013 11  3 - 12 % Final  . Monocytes Absolute 03/23/2013 0.7  0.1 - 1.0 K/uL Final  . Eosinophils Relative 03/23/2013 1  0 - 5 % Final  . Eosinophils Absolute  03/23/2013 0.0  0.0 - 0.7 K/uL Final  . Basophils Relative 03/23/2013 0  0 - 1 % Final  . Basophils Absolute 03/23/2013 0.0  0.0 - 0.1 K/uL Final  . D-Dimer, Quant 03/23/2013 0.30  0.00 - 0.48 ug/mL-FEU Final   Comment:                                 AT THE INHOUSE ESTABLISHED CUTOFF                          VALUE OF 0.48 ug/mL FEU,                          THIS ASSAY HAS BEEN DOCUMENTED                          IN THE LITERATURE TO HAVE                          A SENSITIVITY AND NEGATIVE                          PREDICTIVE VALUE OF AT LEAST                          98 TO 99%.  THE TEST RESULT                          SHOULD BE CORRELATED WITH                          AN ASSESSMENT OF THE CLINICAL                          PROBABILITY OF DVT / VTE.  Admission on 03/18/2013, Discharged on 03/18/2013  Component Date Value Ref Range Status  . WBC 03/18/2013 6.3  4.0 - 10.5 K/uL Final  . RBC 03/18/2013 4.19  3.87 - 5.11 MIL/uL  Final  . Hemoglobin 03/18/2013 10.0* 12.0 - 15.0 g/dL Final  . HCT 03/18/2013 32.4* 36.0 - 46.0 % Final  . MCV 03/18/2013 77.3* 78.0 - 100.0 fL Final  . MCH 03/18/2013 23.9* 26.0 - 34.0 pg Final  . MCHC 03/18/2013 30.9  30.0 - 36.0 g/dL Final  . RDW 03/18/2013 17.6* 11.5 - 15.5 % Final  . Platelets 03/18/2013 222  150 - 400 K/uL Final  . Sodium 03/18/2013 142  137 - 147 mEq/L Final  . Potassium 03/18/2013 3.9  3.7 - 5.3 mEq/L Final  . Chloride 03/18/2013 102  96 - 112 mEq/L Final  . CO2 03/18/2013 26  19 - 32 mEq/L Final  . Glucose, Bld 03/18/2013 100* 70 - 99 mg/dL Final  . BUN 03/18/2013 3* 6 - 23 mg/dL Final  . Creatinine, Ser 03/18/2013 0.65  0.50 - 1.10 mg/dL Final  . Calcium 03/18/2013 9.2  8.4 - 10.5 mg/dL Final  . GFR calc non Af Amer 03/18/2013 >90  >90 mL/min Final  . GFR calc Af Amer 03/18/2013 >90  >90 mL/min Final   Comment: (NOTE)  The eGFR has been calculated using the CKD EPI equation.                          This calculation has not been validated in all clinical situations.                          eGFR's persistently <90 mL/min signify possible Chronic Kidney                          Disease.  Admission on 03/08/2013, Discharged on 03/10/2013  Component Date Value Ref Range Status  . Troponin I 03/08/2013 <0.30  <0.30 ng/mL Final   Comment:                                 Due to the release kinetics of cTnI,                          a negative result within the first hours                          of the onset of symptoms does not rule out                          myocardial infarction with certainty.                          If myocardial infarction is still suspected,                          repeat the test at appropriate intervals.  . WBC 03/08/2013 8.8  4.0 - 10.5 K/uL Final  . RBC 03/08/2013 4.31  3.87 - 5.11 MIL/uL Final  . Hemoglobin 03/08/2013 10.3* 12.0 - 15.0 g/dL Final  . HCT 03/08/2013 33.0* 36.0 - 46.0 % Final  . MCV  03/08/2013 76.6* 78.0 - 100.0 fL Final  . MCH 03/08/2013 23.9* 26.0 - 34.0 pg Final  . MCHC 03/08/2013 31.2  30.0 - 36.0 g/dL Final  . RDW 03/08/2013 17.2* 11.5 - 15.5 % Final  . Platelets 03/08/2013 215  150 - 400 K/uL Final  . Neutrophils Relative % 03/08/2013 64  43 - 77 % Final  . Neutro Abs 03/08/2013 5.7  1.7 - 7.7 K/uL Final  . Lymphocytes Relative 03/08/2013 26  12 - 46 % Final  . Lymphs Abs 03/08/2013 2.3  0.7 - 4.0 K/uL Final  . Monocytes Relative 03/08/2013 9  3 - 12 % Final  . Monocytes Absolute 03/08/2013 0.8  0.1 - 1.0 K/uL Final  . Eosinophils Relative 03/08/2013 1  0 - 5 % Final  . Eosinophils Absolute 03/08/2013 0.1  0.0 - 0.7 K/uL Final  . Basophils Relative 03/08/2013 0  0 - 1 % Final  . Basophils Absolute 03/08/2013 0.0  0.0 - 0.1 K/uL Final  . Sodium 03/08/2013 140  137 - 147 mEq/L Final  . Potassium 03/08/2013 3.6* 3.7 - 5.3 mEq/L Final  . Chloride 03/08/2013 102  96 - 112 mEq/L Final  . CO2 03/08/2013 26  19 - 32 mEq/L Final  . Glucose, Bld 03/08/2013 108* 70 - 99 mg/dL Final  . BUN  03/08/2013 11  6 - 23 mg/dL Final  . Creatinine, Ser 03/08/2013 0.77  0.50 - 1.10 mg/dL Final  . Calcium 03/08/2013 9.2  8.4 - 10.5 mg/dL Final  . GFR calc non Af Amer 03/08/2013 >90  >90 mL/min Final  . GFR calc Af Amer 03/08/2013 >90  >90 mL/min Final   Comment: (NOTE)                          The eGFR has been calculated using the CKD EPI equation.                          This calculation has not been validated in all clinical situations.                          eGFR's persistently <90 mL/min signify possible Chronic Kidney                          Disease.  . Troponin I 03/08/2013 <0.30  <0.30 ng/mL Final   Comment:                                 Due to the release kinetics of cTnI,                          a negative result within the first hours                          of the onset of symptoms does not rule out                          myocardial infarction with  certainty.                          If myocardial infarction is still suspected,                          repeat the test at appropriate intervals.  . Prothrombin Time 03/08/2013 25.2* 11.6 - 15.2 seconds Final  . INR 03/08/2013 2.38* 0.00 - 1.49 Final  . MRSA by PCR 03/08/2013 NEGATIVE  NEGATIVE Final   Comment:                                 The GeneXpert MRSA Assay (FDA                          approved for NASAL specimens                          only), is one component of a                          comprehensive MRSA colonization                          surveillance program. It is not  intended to diagnose MRSA                          infection nor to guide or                          monitor treatment for                          MRSA infections.  Marland Kitchen aPTT 03/08/2013 37  24 - 37 seconds Final   Comment:                                 IF BASELINE aPTT IS ELEVATED,                          SUGGEST PATIENT RISK ASSESSMENT                          BE USED TO DETERMINE APPROPRIATE                          ANTICOAGULANT THERAPY.  . Prothrombin Time 03/08/2013 15.3* 11.6 - 15.2 seconds Final  . INR 03/08/2013 1.24  0.00 - 1.49 Final  . AntiThromb III Func 03/08/2013 117  75 - 120 % Final   Performed at [facility]  . Protein C Activity 03/08/2013 138* 75 - 133 % Final   Performed at [facility]  . Protein C, Total 03/08/2013 101  72 - 160 % Final   Comment: ** Please note change in reference range(s). **                          Performed at [facility]  . Protein S Activity 03/08/2013 95  69 - 129 % Final   Performed at [facility]  . Protein S Ag, Total 03/08/2013 96  60 - 150 % Final   Performed at [facility]  . PTT Lupus Anticoagulant 03/08/2013 39.9  28.0 - 43.0 secs Final  . PTTLA Confirmation 03/08/2013 NOT APPL  <8.0 secs Final  . PTTLA 4:1 Mix 03/08/2013 NOT APPL  28.0 - 43.0 secs Final  . Drvvt  03/08/2013 44.4* <42.9 secs Final  . Drvvt confirmation 03/08/2013 NOT APPL  <1.11 Ratio Final  . dRVVT Incubated 1:1 Mix 03/08/2013 37.9  <42.9 secs Final  . Lupus Anticoagulant 03/08/2013 NOT DETECTED  NOT DETECTED Final   Performed at [facility]  . Beta-2 Glyco I IgG 03/08/2013 3  <20 G Units Final  . Beta-2-Glycoprotein I IgM 03/08/2013 13  <20 M Units Final  . Beta-2-Glycoprotein I IgA 03/08/2013 21* <20 A Units Final   Performed at [facility]  . Homocysteine 03/08/2013 11.9  4.0 - 15.4 umol/L Final   Performed at [facility]  . Recommendations-F5LEID: 03/08/2013 (NOTE)   Final   Comment: ** NEGATIVE FOR FACTOR V MUTATION **                          Reference Interval:  Negative for Factor V Mutation                          Interpretation:                          Factor V Leiden Gene Mutation (G1691A) is the most common genetic risk                          factor for thrombosis and accounts for 94% of individuals classified                          as Activated Protein C (APC) resistant.  Testing for Factor V Leiden                          Mutation is recommended for all individuals with venous thrombosis or                          history of a thrombic event.                          Both heterozygotes and homozygotes are at risk for thrombosis, which                          is 5-10 fold, and 50-100 fold respectively.                          DNA testing for the V R2 polymorphism is recommended for Factor V                          Leiden heterozygotes.  Presence of this polymorphism further increases                          the risk of venous thrombosis in Factor V Leiden heterozygotes.                          DNA from this patient was tested using a FDA approved assay for                           Factor V Leiden.                          Performed at [facility]  . Recommendations-PTGENE: 03/08/2013 (NOTE)    Final   Comment: **  NEGATIVE FOR PROTHROMBIN II GENE MUTATION  **                          Reference Interval:                          Negative for Prothrombin II Gene Mutation                          Interpretation:  Prothrombin II 20210A Gene Mutation is a genetic risk factor resulting                          in an increase risk for venous thrombosis (five-fold); Myocardial                          infarction (two-fold); Cerebrovascular ischemic disease in young                          patients (especially females using oral contraceptives) and pulmonary                          embolism.                          Both heterozygous and homozygous carriers are at increased risk                          although homozygosity is rare.  The incidence of heterozygous carriers                          of this mutation is estimated to be 1 to 2.5% for individuals of                          European and African descent.                          DNA from this patient was tested using a FDA approved assay for                                 Factor II Prothrombin Mutation.                               Performed at [facility]  . Anticardiolipin IgG 03/08/2013 21  <23 GPL U/mL Final  . Anticardiolipin IgM 03/08/2013 22* <11 MPL U/mL Final  . Anticardiolipin IgA 03/08/2013 4* <22 APL U/mL Final   Comment: (NOTE)                          Reference Range:  Cardiolipin IgG                            Normal                  <23                            Low Positive (+)        23-35                            Moderate Positive (+)   36-50                            High Positive (+)       >50  Reference Range:  Cardiolipin IgM                            Normal                  <11                            Low Positive (+)        11-20                            Moderate Positive (+)   21-30                            High Positive (+)        >30                          Reference Range:  Cardiolipin IgA                            Normal                  <22                            Low Positive (+)        22-35                            Moderate Positive (+)   36-45                            High Positive (+)       >45                          Performed at [facility]  . Troponin I 03/08/2013 <0.30  <0.30 ng/mL Final   Comment:                                 Due to the release kinetics of cTnI,                          a negative result within the first hours                          of the onset of symptoms does not rule out                          myocardial infarction with certainty.                          If myocardial infarction is still suspected,                          repeat the test at appropriate intervals.  . Preg Test, Ur 03/08/2013 NEGATIVE  NEGATIVE Final   Comment:  THE SENSITIVITY OF THIS                          METHODOLOGY IS >20 mIU/mL.  Marland Kitchen D-Dimer, Quant 03/08/2013 1.01* 0.00 - 0.48 ug/mL-FEU Final   Comment:                                 AT THE INHOUSE ESTABLISHED CUTOFF                          VALUE OF 0.48 ug/mL FEU,                          THIS ASSAY HAS BEEN DOCUMENTED                          IN THE LITERATURE TO HAVE                          A SENSITIVITY AND NEGATIVE                          PREDICTIVE VALUE OF AT LEAST                          98 TO 99%.  THE TEST RESULT                          SHOULD BE CORRELATED WITH                          AN ASSESSMENT OF THE CLINICAL                          PROBABILITY OF DVT / VTE.  Marland Kitchen Sodium 03/09/2013 141  137 - 147 mEq/L Final  . Potassium 03/09/2013 4.2  3.7 - 5.3 mEq/L Final  . Chloride 03/09/2013 104  96 - 112 mEq/L Final  . CO2 03/09/2013 26  19 - 32 mEq/L Final  . Glucose, Bld 03/09/2013 86  70 - 99 mg/dL Final  . BUN 03/09/2013 7  6 - 23 mg/dL Final  . Creatinine, Ser 03/09/2013  0.76  0.50 - 1.10 mg/dL Final  . Calcium 03/09/2013 9.2  8.4 - 10.5 mg/dL Final  . Total Protein 03/09/2013 6.9  6.0 - 8.3 g/dL Final  . Albumin 03/09/2013 3.6  3.5 - 5.2 g/dL Final  . AST 03/09/2013 14  0 - 37 U/L Final  . ALT 03/09/2013 9  0 - 35 U/L Final  . Alkaline Phosphatase 03/09/2013 68  39 - 117 U/L Final  . Total Bilirubin 03/09/2013 0.3  0.3 - 1.2 mg/dL Final  . GFR calc non Af Amer 03/09/2013 >90  >90 mL/min Final  . GFR calc Af Amer 03/09/2013 >90  >90 mL/min Final   Comment: (NOTE)                          The eGFR has been calculated using the CKD EPI equation.  This calculation has not been validated in all clinical situations.                          eGFR's persistently <90 mL/min signify possible Chronic Kidney                          Disease.  . WBC 03/09/2013 9.1  4.0 - 10.5 K/uL Final  . RBC 03/09/2013 4.47  3.87 - 5.11 MIL/uL Final  . Hemoglobin 03/09/2013 10.9* 12.0 - 15.0 g/dL Final  . HCT 03/09/2013 34.4* 36.0 - 46.0 % Final  . MCV 03/09/2013 77.0* 78.0 - 100.0 fL Final  . MCH 03/09/2013 24.4* 26.0 - 34.0 pg Final  . MCHC 03/09/2013 31.7  30.0 - 36.0 g/dL Final  . RDW 03/09/2013 17.6* 11.5 - 15.5 % Final  . Platelets 03/09/2013 224  150 - 400 K/uL Final  . Prothrombin Time 03/09/2013 14.9  11.6 - 15.2 seconds Final  . INR 03/09/2013 1.20  0.00 - 1.49 Final  . Ferritin 03/09/2013 8* 10 - 291 ng/mL Final   Performed at [facility]  . Iron 03/09/2013 27* 42 - 135 ug/dL Final  . TIBC 03/09/2013 439  250 - 470 ug/dL Final  . Saturation Ratios 03/09/2013 6* 20 - 55 % Final  . UIBC 03/09/2013 412* 125 - 400 ug/dL Final   Performed at [facility]  . WBC 03/10/2013 7.1  4.0 - 10.5 K/uL Final  . RBC 03/10/2013 4.43  3.87 - 5.11 MIL/uL Final  . Hemoglobin 03/10/2013 10.4* 12.0 - 15.0 g/dL Final  . HCT 03/10/2013 34.0* 36.0 - 46.0 % Final  . MCV 03/10/2013 76.7* 78.0 - 100.0 fL Final  . MCH 03/10/2013 23.5* 26.0 - 34.0 pg  Final  . MCHC 03/10/2013 30.6  30.0 - 36.0 g/dL Final  . RDW 03/10/2013 17.4* 11.5 - 15.5 % Final  . Platelets 03/10/2013 209  150 - 400 K/uL Final   l PATHOLOGY:  FINAL for Victoria, Stein ([id number]) Patient: Victoria, Stein Collected: 02/23/2013 Client: [place] Accession: [id number] Received: 02/23/2013 Victoria Stein DOB: [birth year] Age: 74 Gender: F Reported: 02/25/2013 [place]. Patient Ph: [phone] MRN #: [id number] [place], [place] [address] Client Acc#: Chart #: [id number] Phone: [phone] Fax: CC: REPORT OF SURGICAL PATHOLOGY FINAL DIAGNOSIS Diagnosis Parotid gland, right parotid mass - BENIGN SALIVARY GLAND TISSUE WITH PLEOMORPHIC ADENOMA. - BENIGN LYMPH NODES. Susanne Greenhouse MD Pathologist, Electronic Signature (Case signed 02/25/2013) Specimen Gross and Clinical Information Specimen(s) Obtained: Parotid gland, right parotid mass Gross Received in formalin is a 4.0 x 2.7 x 2.2 cm soft to rubbery, ovoid lobulated structure. The cut surface shows an approximately 3 cm well circumscribed cystic lesion which contains soft, friable pink tan material. This cystic lesion abuts the surgical margins which are inked black. The adjacent tissue is lobulated, yellow tan. Multiple sections are submitted in four blocks. A-C = transverse sections D = radial sections Four blocks submitted (TB:kh 02-24-13) Report signed out from the following location(s) Technical Component performed at [facility]. [address],[place] [address].OTLX:[id number]., Interpretation performed at [place], [place], [place] [address]. CLIA #: [id number],  Urinalysis    Component Value Date/Time   COLORURINE YELLOW 01/21/2013 1825   APPEARANCEUR CLEAR 01/21/2013 1825   LABSPEC <1.005* 01/21/2013 1825   PHURINE 6.5 01/21/2013 1825   GLUCOSEU NEGATIVE 01/21/2013 1825   HGBUR  TRACE* 01/21/2013 1825   BILIRUBINUR  NEGATIVE 01/21/2013 1825   KETONESUR NEGATIVE 01/21/2013 1825   PROTEINUR NEGATIVE 01/21/2013 1825   UROBILINOGEN 0.2 01/21/2013 1825   NITRITE NEGATIVE 01/21/2013 1825   LEUKOCYTESUR NEGATIVE 01/21/2013 1825    RADIOGRAPHIC STUDIES:   US Venous Img Upper Uni Left(03/03/2013) Status: Final result         PACS Images    Show images for US Venous Img Upper Uni Left         Study Result    CLINICAL DATA: Pain post parotid surgery. Edema, color changes.  EXAM:  LEFT UPPER EXTREMITY VENOUS DOPPLER ULTRASOUND  TECHNIQUE:  Gray-scale sonography with graded compression, as well as color  Doppler and duplex ultrasound were performed to evaluate the upper  extremity deep venous system from the level of the subclavian vein  and including the jugular, axillary, basilic and upper cephalic  vein. Spectral Doppler was utilized to evaluate flow at rest and  with distal augmentation maneuvers.  COMPARISON: None.  FINDINGS:  Thrombus within deep veins: Segmentally in one component of the  duplicated left brachial vein.  Compressibility of deep veins: Decreased in the affected segment  brachial vein  Duplex waveform respiratory phasicity: Normal.  Duplex waveform response to augmentation: Deferred secondary to  presence of thrombus.  Venous reflux: None visualized.  Other findings: There is also thrombosis of the cephalic vein at the  level of the rest, with normal compressibility centrally. There is  partial thrombosis of the basilic vein.  IMPRESSION:  1. Partially occlusive left brachial vein DVT.  2. Superficial thrombophlebitis involving basilic vein and cephalic  vein in the forearm.    Ct Angio Chest Pe W/cm &/or Wo Cm  03/08/2013   CLINICAL DATA:  Chest pain.  History of left arm blood clot.  EXAM: CT ANGIOGRAPHY CHEST WITH CONTRAST  TECHNIQUE: Multidetector CT imaging of the chest was performed using the standard protocol during bolus administration of intravenous contrast.  Multiplanar CT image reconstructions and MIPs were obtained to evaluate the vascular anatomy.  CONTRAST:  118m OMNIPAQUE IOHEXOL 350 MG/ML SOLN  COMPARISON:  Coronary CTA 11/26/2007  FINDINGS: THORACIC INLET/BODY WALL:  No acute abnormality.  MEDIASTINUM:  Normal heart size.  No pericardial effusion.  There is a solitary pulmonary arterial filling defect within a sub segmental anterior right upper lobe branch. No acute systemic arterial abnormality. No adenopathy.  LUNG WINDOWS:  No consolidation.  No effusion.  No suspicious pulmonary nodule.  UPPER ABDOMEN:  No acute findings.  OSSEOUS:  No acute fracture.  No suspicious lytic or blastic lesions.  Critical Value/emergent results were called by telephone at the time of interpretation on 03/08/2013 at 5:26 AM to Dr. DRipley Fraise, who verbally acknowledged these results.  Review of the MIP images confirms the above findings.  IMPRESSION: Solitary subsegmental pulmonary embolism to the right upper lobe.   Electronically Signed   By: JJorje GuildM.D.   On: 03/08/2013 05:26   Mr Brain Wo Contrast  03/18/2013   CLINICAL DATA:  Dizziness and blurred vision. Recent surgery for a right parotid mass.  EXAM: MRI HEAD WITHOUT CONTRAST  TECHNIQUE: Multiplanar, multiecho pulse sequences of the brain and surrounding structures were obtained without intravenous contrast.  COMPARISON:  MR HEAD WO/W CM dated 01/26/2013  FINDINGS: Patient is status post right parotid resection. No acute infarct, hemorrhage, or mass lesion is present. No significant white matter changes are present. The ventricles are of normal size. No significant extraaxial  fluid collection is present.  Flow is present in the major intracranial arteries. The globes and orbits are intact. The paranasal sinuses and mastoid air cells are clear.  IMPRESSION: 1. Status post resection of the right parotid mass. 2. Stable normal MRI appearance of the brain.   Electronically Signed   By: Lawrence Santiago M.D.   On:  03/18/2013 21:09   US Venous Img Lower Bilateral  03/08/2013   CLINICAL DATA:  Pulmonary emboli .  Pain, edema.  EXAM: BILATERAL LOWER EXTREMITY VENOUS DOPPLER ULTRASOUND  TECHNIQUE: Gray-scale sonography with compression, as well as color and duplex ultrasound, were performed to evaluate the deep venous system from the level of the common femoral vein through the popliteal and proximal calf veins.  COMPARISON:  None  FINDINGS: Normal compressibility of the common femoral, superficial femoral, and popliteal veins, as well as the proximal calf veins. No filling defects to suggest DVT on grayscale or color Doppler imaging. Doppler waveforms show normal direction of venous flow, normal respiratory phasicity and response to augmentation. Visualized segments of saphenous venous system normal in caliber and compressibility.  IMPRESSION: No evidence of  lower extremity deep vein thrombosis.   Electronically Signed   By: Arne Cleveland M.D.   On: 03/08/2013 14:57   US Venous Img Upper Uni Left  03/08/2013   CLINICAL DATA:  Recent diagnosis of left brachial vein DVT.  EXAM: LEFT UPPER EXTREMITY VENOUS DOPPLER ULTRASOUND  TECHNIQUE: Gray-scale sonography with graded compression, as well as color Doppler and duplex ultrasound were performed to evaluate the upper extremity deep venous system from the level of the subclavian vein and including the jugular, axillary, basilic and upper cephalic vein. Spectral Doppler was utilized to evaluate flow at rest and with distal augmentation maneuvers.  COMPARISON:  US VENOUS IMG UPPER UNI *L* dated 03/03/2013  FINDINGS: Thrombus within deep veins: Need nonocclusive thrombus identified in 1 of 2 paired deep brachial veins of the upper arm demonstrates improved appearance with some residual mural thrombus remaining.  Venous reflux:  None visualized.  Other findings: Superficial thrombophlebitis with nonocclusive thrombus in the basilic vein of the upper arm and the cephalic vein in the  forearm again noted. Both veins appear less distended by thrombus.  IMPRESSION: No propagation of brachial vein DVT or superficial thrombus in the basilic vein and cephalic vein of the left arm. Nonocclusive thrombus in the brachial vein appears improved. The cephalic and basilic veins appear less distended by thrombus.   Electronically Signed   By: Aletta Edouard M.D.   On: 03/08/2013 15:05   US Venous Img Upper Uni Left     ASSESSMENT:  #1. Brachial vein thrombosis with pulmonary embolism, tolerating Lovenox well, no evidence of acquired or congenital thrombophilia. #2. Postphlebitic syndrome with pain. #3. Anxiety. #4. No evidence of congenital or acquired thrombophilic syndrome.   PLAN:  #1. Patient was told to continue Lovenox every 12 hours until her current supply runs out. She'll then be switched to 90 mg subcutaneous daily out to 3 months of therapy at which time repeat CT scan of the chest and ultrasound of the left upper extremity will be done. #2. Feraheme 1020 mg intravenously today to help with dizziness and fatigue.. #3. Oxycodone 5-10 mg 34 hours as needed for pain. #4. Xanax 0.5 mg up to every 4 hours as needed for anxiety. #5. Hold off on Lexapro for now. #6. Followup in one week with CBC and d-dimer.   All questions were answered. The patient  knows to call the clinic with any problems, questions or concerns. We can certainly see the patient much sooner if necessary.   I spent 40 minutes counseling the patient face to face. The total time spent in the appointment was 55 minutes.    Doroteo Bradford, MD 03/24/2013 6:40 AM

## 2013-03-26 ENCOUNTER — Other Ambulatory Visit: Payer: Self-pay

## 2013-03-26 ENCOUNTER — Emergency Department (HOSPITAL_COMMUNITY): Payer: BC Managed Care – PPO

## 2013-03-26 ENCOUNTER — Encounter (HOSPITAL_COMMUNITY): Payer: Self-pay | Admitting: Emergency Medicine

## 2013-03-26 ENCOUNTER — Emergency Department (HOSPITAL_COMMUNITY)
Admission: EM | Admit: 2013-03-26 | Discharge: 2013-03-26 | Disposition: A | Discharge status: Home / Self Care | Payer: BC Managed Care – PPO | Attending: Emergency Medicine | Admitting: Emergency Medicine

## 2013-03-26 DIAGNOSIS — Z862 Personal history of diseases of the blood and blood-forming organs and certain disorders involving the immune mechanism: Secondary | ICD-10-CM | POA: Insufficient documentation

## 2013-03-26 DIAGNOSIS — F329 Major depressive disorder, single episode, unspecified: Secondary | ICD-10-CM | POA: Insufficient documentation

## 2013-03-26 DIAGNOSIS — Z7901 Long term (current) use of anticoagulants: Secondary | ICD-10-CM | POA: Insufficient documentation

## 2013-03-26 DIAGNOSIS — F411 Generalized anxiety disorder: Secondary | ICD-10-CM | POA: Insufficient documentation

## 2013-03-26 DIAGNOSIS — F3289 Other specified depressive episodes: Secondary | ICD-10-CM | POA: Insufficient documentation

## 2013-03-26 DIAGNOSIS — Z79899 Other long term (current) drug therapy: Secondary | ICD-10-CM | POA: Insufficient documentation

## 2013-03-26 DIAGNOSIS — Z86718 Personal history of other venous thrombosis and embolism: Secondary | ICD-10-CM | POA: Insufficient documentation

## 2013-03-26 DIAGNOSIS — Z86711 Personal history of pulmonary embolism: Secondary | ICD-10-CM | POA: Insufficient documentation

## 2013-03-26 DIAGNOSIS — Z8679 Personal history of other diseases of the circulatory system: Secondary | ICD-10-CM | POA: Insufficient documentation

## 2013-03-26 DIAGNOSIS — Z880 Allergy status to penicillin: Secondary | ICD-10-CM | POA: Insufficient documentation

## 2013-03-26 DIAGNOSIS — R079 Chest pain, unspecified: Secondary | ICD-10-CM

## 2013-03-26 DIAGNOSIS — Z8639 Personal history of other endocrine, nutritional and metabolic disease: Secondary | ICD-10-CM | POA: Insufficient documentation

## 2013-03-26 LAB — COMPREHENSIVE METABOLIC PANEL
ALBUMIN: 3.8 g/dL (ref 3.5–5.2)
ALK PHOS: 68 U/L (ref 39–117)
ALT: 43 U/L — ABNORMAL HIGH (ref 0–35)
AST: 46 U/L — ABNORMAL HIGH (ref 0–37)
BUN: 6 mg/dL (ref 6–23)
CO2: 25 mEq/L (ref 19–32)
Calcium: 9.9 mg/dL (ref 8.4–10.5)
Chloride: 101 mEq/L (ref 96–112)
Creatinine, Ser: 0.65 mg/dL (ref 0.50–1.10)
GFR calc Af Amer: >90 mL/min (ref >90)
GFR calc non Af Amer: >90 mL/min (ref >90)
Glucose, Bld: 108 mg/dL — ABNORMAL HIGH (ref 70–99)
POTASSIUM: 4.1 meq/L (ref 3.7–5.3)
SODIUM: 142 meq/L (ref 137–147)
TOTAL PROTEIN: 7.2 g/dL (ref 6.0–8.3)
Total Bilirubin: 0.2 mg/dL — ABNORMAL LOW (ref 0.3–1.2)

## 2013-03-26 LAB — CBC WITH DIFFERENTIAL/PLATELET
BASOS PCT: 0 % (ref 0–1)
Basophils Absolute: 0 10*3/uL (ref 0.0–0.1)
EOS ABS: 0 10*3/uL (ref 0.0–0.7)
EOS PCT: 0 % (ref 0–5)
HCT: 31.9 % — ABNORMAL LOW (ref 36.0–46.0)
Hemoglobin: 9.9 g/dL — ABNORMAL LOW (ref 12.0–15.0)
Lymphocytes Relative: 26 % (ref 12–46)
Lymphs Abs: 1.6 10*3/uL (ref 0.7–4.0)
MCH: 24 pg — AB (ref 26.0–34.0)
MCHC: 31 g/dL (ref 30.0–36.0)
MCV: 77.2 fL — ABNORMAL LOW (ref 78.0–100.0)
MONOS PCT: 14 % — AB (ref 3–12)
Monocytes Absolute: 0.9 10*3/uL (ref 0.1–1.0)
Neutro Abs: 3.8 10*3/uL (ref 1.7–7.7)
Neutrophils Relative %: 60 % (ref 43–77)
PLATELETS: 212 10*3/uL (ref 150–400)
RBC: 4.13 MIL/uL (ref 3.87–5.11)
RDW: 17.1 % — ABNORMAL HIGH (ref 11.5–15.5)
WBC: 6.3 10*3/uL (ref 4.0–10.5)

## 2013-03-26 LAB — PROTIME-INR
INR: 1.03 (ref 0.00–1.49)
Prothrombin Time: 13.3 seconds (ref 11.6–15.2)

## 2013-03-26 LAB — TROPONIN I: Troponin I: <0.3 ng/mL (ref <0.30)

## 2013-03-26 MED ORDER — HYDROMORPHONE HCL PF 1 MG/ML IJ SOLN
0.5000 mg | Freq: Once | INTRAMUSCULAR | Status: AC
  Administered 2013-03-26: 0.5 mg via INTRAVENOUS
  Filled 2013-03-26: qty 1

## 2013-03-26 MED ORDER — PANTOPRAZOLE SODIUM 40 MG IV SOLR
40.0000 mg | Freq: Once | INTRAVENOUS | Status: AC
  Administered 2013-03-26: 40 mg via INTRAVENOUS
  Filled 2013-03-26: qty 40

## 2013-03-26 MED ORDER — PANTOPRAZOLE SODIUM 20 MG PO TBEC: 20.0000 mg | DELAYED_RELEASE_TABLET | Freq: Every day | ORAL | Status: DC

## 2013-03-26 MED ORDER — TRAMADOL HCL 50 MG PO TABS
50.0000 mg | ORAL_TABLET | Freq: Four times a day (QID) | ORAL | Status: DC | PRN
Start: 2013-03-26 — End: 2013-03-28

## 2013-03-26 MED ORDER — SODIUM CHLORIDE 0.9 % IJ SOLN
INTRAMUSCULAR | Status: AC
  Filled 2013-03-26: qty 250

## 2013-03-26 MED ORDER — LORAZEPAM 2 MG/ML IJ SOLN
0.5000 mg | Freq: Once | INTRAMUSCULAR | Status: AC
  Administered 2013-03-26: 0.5 mg via INTRAVENOUS
  Filled 2013-03-26: qty 1

## 2013-03-26 MED ORDER — IOHEXOL 350 MG/ML SOLN
100.0000 mL | Freq: Once | INTRAVENOUS | Status: AC | PRN
  Administered 2013-03-26: 100 mL via INTRAVENOUS

## 2013-03-26 NOTE — Discharge Instructions (Signed)
Follow up with your md this week. °

## 2013-03-26 NOTE — ED Notes (Signed)
Patient complaining of dull, aching chest pain to central chest that started at approximately 1200. Reports diagnosed recently with blood clot to left arm and lungs.

## 2013-03-26 NOTE — ED Notes (Signed)
Patient with no complaints at this time. Respirations even and unlabored. Skin warm/dry. Discharge instructions reviewed with patient at this time. Patient given opportunity to voice concerns/ask questions. IV removed per policy and band-aid applied to site. Patient discharged at this time and left Emergency Department with steady gait.  

## 2013-03-27 NOTE — ED Provider Notes (Signed)
CSN: [id number]     Arrival date & time 03/26/13  1913 History   First MD Initiated Contact with Patient 03/26/13 1925     Chief Complaint  Patient presents with  . Chest Pain     (Consider location/radiation/quality/duration/timing/severity/associated sxs/prior Treatment) Patient is a 43 y.o. female presenting with chest pain. The history is provided by the patient (the pt complains of chest pain.  recently had a pe).  Chest Pain Pain location:  Substernal area Pain quality: aching   Pain radiates to:  Does not radiate Pain radiates to the back: no   Onset quality:  Gradual Timing:  Constant Associated symptoms: no abdominal pain, no back pain, no cough, no fatigue and no headache     Past Medical History  Diagnosis Date  . Normal cardiac stress test 2009    normal coronary CT scan  . Parotid mass 02/2013     Benign; status post resection by Dr. Constance Holster  . DVT of upper extremity (deep vein thrombosis) 02/2013    Associated with IV needle  . Acute pulmonary embolism 03/08/2013  . Microcytic anemia 03/09/2013    Ferritin 8   . Chronic anxiety   . Diastolic dysfunction 05/07/7614    Grade 2  . Depression    Past Surgical History  Procedure Laterality Date  . Wisdom tooth extraction    . Parotid gland tumor excision     Family History  Problem Relation Age of Onset  . Diabetes Mother   . Heart disease Father    History  Substance Use Topics  . Smoking status: Never Smoker   . Smokeless tobacco: Not on file  . Alcohol Use: No   OB History   Grav Para Term Preterm Abortions TAB SAB Ect Mult Living                 Review of Systems  Constitutional: Negative for appetite change and fatigue.  HENT: Negative for congestion, ear discharge and sinus pressure.   Eyes: Negative for discharge.  Respiratory: Negative for cough.   Cardiovascular: Positive for chest pain.  Gastrointestinal: Negative for abdominal pain and diarrhea.  Genitourinary: Negative for frequency and  hematuria.  Musculoskeletal: Negative for back pain.  Skin: Negative for rash.  Neurological: Negative for seizures and headaches.  Psychiatric/Behavioral: Negative for hallucinations.      Allergies  Amoxicillin  Home Medications   Current Outpatient Rx  Name  Route  Sig  Dispense  Refill  . acetaminophen (TYLENOL) 160 MG/5ML solution   Oral   Take 160 mg by mouth daily as needed for mild pain, moderate pain, fever or headache.         . ALPRAZolam (XANAX) 0.5 MG tablet   Oral   Take 0.5 mg by mouth every 6 (six) hours as needed for anxiety.         . enoxaparin (LOVENOX) 60 MG/0.6ML injection   Subcutaneous   Inject 0.6 mLs (60 mg total) into the skin every 12 (twelve) hours.   60 Syringe   6   . escitalopram (LEXAPRO) 10 MG tablet   Oral   Take 5-10 mg by mouth every morning. Take 1/2 tablet daily x 7 days then 1 tablet daily.         . ondansetron (ZOFRAN) 4 MG tablet   Oral   Take 4 mg by mouth every 6 (six) hours as needed for nausea or vomiting.         Marland Kitchen oxyCODONE (OXY IR/ROXICODONE)  5 MG immediate release tablet   Oral   Take 5-10 mg by mouth every 4 (four) hours as needed for moderate pain, severe pain or breakthrough pain. Take 1 or 2 tablets every 4 hours as needed for pain.         . pantoprazole (PROTONIX) 20 MG tablet   Oral   Take 1 tablet (20 mg total) by mouth daily.   30 tablet   0   . traMADol (ULTRAM) 50 MG tablet   Oral   Take 1 tablet (50 mg total) by mouth every 6 (six) hours as needed.   15 tablet   0    BP 119/70  Pulse 94  Temp(Src) 98.3 F (36.8 C) (Oral)  Resp 20  Ht 5\' 3"  (1.6 m)  Wt 138 lb (62.596 kg)  BMI 24.45 kg/m2  SpO2 100%  LMP 03/15/2013 Physical Exam  Constitutional: She is oriented to person, place, and time. She appears well-developed.  HENT:  Head: Normocephalic.  Eyes: Conjunctivae and EOM are normal. No scleral icterus.  Neck: Neck supple. No thyromegaly present.  Cardiovascular: Normal rate  and regular rhythm.  Exam reveals no gallop and no friction rub.   No murmur heard. Pulmonary/Chest: No stridor. She has no wheezes. She has no rales. She exhibits no tenderness.  Abdominal: She exhibits no distension. There is no tenderness. There is no rebound.  Musculoskeletal: Normal range of motion. She exhibits no edema.  Lymphadenopathy:    She has no cervical adenopathy.  Neurological: She is oriented to person, place, and time. She exhibits normal muscle tone. Coordination normal.  Skin: No rash noted. No erythema.  Psychiatric: She has a normal mood and affect. Her behavior is normal.    ED Course  Procedures (including critical care time) Labs Review Labs Reviewed  CBC WITH DIFFERENTIAL - Abnormal; Notable for the following:    Hemoglobin 9.9 (*)    HCT 31.9 (*)    MCV 77.2 (*)    MCH 24.0 (*)    RDW 17.1 (*)    Monocytes Relative 14 (*)    All other components within normal limits  COMPREHENSIVE METABOLIC PANEL - Abnormal; Notable for the following:    Glucose, Bld 108 (*)    AST 46 (*)    ALT 43 (*)    Total Bilirubin 0.2 (*)    All other components within normal limits  PROTIME-INR  TROPONIN I   Imaging Review Ct Angio Chest Pe W/cm &/or Wo Cm  03/26/2013   CLINICAL DATA:  Concern for pulmonary embolism. DVT lower extremity. Recent carotid mass excision.  EXAM: CT ANGIOGRAPHY CHEST WITH CONTRAST  TECHNIQUE: Multidetector CT imaging of the chest was performed using the standard protocol during bolus administration of intravenous contrast. Multiplanar CT image reconstructions and MIPs were obtained to evaluate the vascular anatomy.  CONTRAST:  151mL OMNIPAQUE IOHEXOL 350 MG/ML SOLN  COMPARISON:  DG CHEST 1V PORT dated 03/26/2013; CT ANGIO CHEST W/CM &/OR WO/CM dated 03/08/2013; CT NECK W/CM dated 01/20/2013; MR C SPINE W/O CM dated 01/26/2013  FINDINGS: No filling defects within pulmonary arteries to suggest acute pulmonary embolism. The right upper lobe pulmonary embolism  described on comparison exam is not clearly evident.  No acute findings aorta great vessels.  No pericardial fluid.  Review of the lung parenchyma demonstrates no pulmonary infarction. No pleural fluid or pneumothorax.  Limited view of the upper abdomen is unremarkable. Limited view of skeleton is unremarkable.  Review of the MIP images confirms  the above findings.  IMPRESSION: 1. No evidence of acute pulmonary embolism. 2. Pulmonary embolism in the right upper lobe on comparison exam is not identified on current study.   Electronically Signed   By: Suzy Bouchard M.D.   On: 03/26/2013 21:22   Dg Chest Portable 1 View  03/26/2013   CLINICAL DATA:  Chest pain, recently diagnosed PE  EXAM: PORTABLE CHEST - 1 VIEW  COMPARISON:  CTA chest dated 03/08/2013  FINDINGS: Lungs are clear. No pleural effusion or pneumothorax.  The heart is normal in size.  IMPRESSION: No evidence of acute cardiopulmonary disease.   Electronically Signed   By: Julian Hy M.D.   On: 03/26/2013 20:36     EKG Interpretation None      MDM   Final diagnoses:  Chest pain    Chest pain,   Non cardiac,  No pe       Maudry Diego, MD 03/27/13 2118

## 2013-03-28 ENCOUNTER — Telehealth (HOSPITAL_COMMUNITY): Payer: Self-pay

## 2013-03-28 ENCOUNTER — Encounter (HOSPITAL_BASED_OUTPATIENT_CLINIC_OR_DEPARTMENT_OTHER): Payer: BC Managed Care – PPO | Admitting: Oncology

## 2013-03-28 VITALS — BP 110/56 | HR 80 | Temp 98.1°F | Resp 18

## 2013-03-28 DIAGNOSIS — I82629 Acute embolism and thrombosis of deep veins of unspecified upper extremity: Secondary | ICD-10-CM

## 2013-03-28 DIAGNOSIS — R109 Unspecified abdominal pain: Secondary | ICD-10-CM

## 2013-03-28 DIAGNOSIS — R1013 Epigastric pain: Secondary | ICD-10-CM

## 2013-03-28 DIAGNOSIS — R5383 Other fatigue: Secondary | ICD-10-CM

## 2013-03-28 DIAGNOSIS — I2699 Other pulmonary embolism without acute cor pulmonale: Secondary | ICD-10-CM

## 2013-03-28 DIAGNOSIS — K259 Gastric ulcer, unspecified as acute or chronic, without hemorrhage or perforation: Secondary | ICD-10-CM

## 2013-03-28 DIAGNOSIS — R5381 Other malaise: Secondary | ICD-10-CM

## 2013-03-28 LAB — FERRITIN: Ferritin: 940 ng/mL — ABNORMAL HIGH (ref 10–291)

## 2013-03-28 LAB — TSH: TSH: 2.125 u[IU]/mL (ref 0.350–4.500)

## 2013-03-28 MED ORDER — SUCRALFATE 1 GM/10ML PO SUSP: 1.0000 g | Freq: Three times a day (TID) | ORAL | Status: DC

## 2013-03-28 MED ORDER — OMEPRAZOLE 20 MG PO CPDR: 20.0000 mg | DELAYED_RELEASE_CAPSULE | Freq: Every day | ORAL | Status: DC

## 2013-03-28 MED ORDER — TRAMADOL HCL 50 MG PO TABS: 50.0000 mg | ORAL_TABLET | Freq: Four times a day (QID) | ORAL | Status: DC | PRN

## 2013-03-28 MED ORDER — ENOXAPARIN SODIUM 150 MG/ML ~~LOC~~ SOLN: 90.0000 mg | SUBCUTANEOUS | Status: DC

## 2013-03-28 NOTE — Patient Instructions (Signed)
[facility] Discharge Instructions  RECOMMENDATIONS MADE BY THE CONSULTANT AND ANY TEST RESULTS WILL BE SENT TO YOUR REFERRING PHYSICIAN.  MEDICATIONS PRESCRIBED:  1. Tramadol for pain (Avoid Advil, Ibuprofen, and Aleve for the time being as they can be tough on the stomach). 2. Lovenox 90 mg daily 3. Prilosec 20 mg daily (for stomach acid) 4. Carafate four times daily (to protect the stomach from ulcer).  INSTRUCTIONS GIVEN AND DISCUSSED: Finish twice daily Lovenox.  Following last dose, start the once daily dosing 24 hours later.   SPECIAL INSTRUCTIONS/FOLLOW-UP: Labs today. Follow-up with Dr. Hilma Favors as directed. Continue with anti-depressant (Lexapro) daily. Return in 4 weeks for follow-up  Thank you for choosing [facility] to provide your oncology and hematology care.  To afford each patient quality time with our providers, please arrive at least 15 minutes before your scheduled appointment time.  With your help, our goal is to use those 15 minutes to complete the necessary work-up to ensure our physicians have the information they need to help with your evaluation and healthcare recommendations.    Effective January 1st, 2014, we ask that you re-schedule your appointment with our physicians should you arrive 10 or more minutes late for your appointment.  We strive to give you quality time with our providers, and arriving late affects you and other patients whose appointments are after yours.    Again, thank you for choosing [facility].  Our hope is that these requests will decrease the amount of time that you wait before being seen by our physicians.       _____________________________________________________________  Should you have questions after your visit to [facility], please contact our office at [phone] between the hours of 8:30 a.m. and 5:00 p.m.  Voicemails left after 4:30 p.m. will not be returned until  the following business day.  For prescription refill requests, have your pharmacy contact our office with your prescription refill request.

## 2013-03-28 NOTE — Progress Notes (Signed)
Patient is seen as a work-in today as she was told by her PCP's office that Dr. Hilma Favors was out and to follow-up with, despite her issue not being a hematologic/oncologic issue.    Victoria Stein reported to the ED on 3/21 with chest discomfort.  She was worked-up and the work-up was negative.  As a matter of fact, a CT angio of chest was performed and it revealed resolution of her previously identified PE.  This is a movement in the right direction.  She was discharged from the ED with Pepcid.  Today, she reports continued chest pain and fatigue.  1. Chest pain.  On further discussion and physical exam, her "chest pain" is epigastric pain.  She notes that it is painful.  On physical exam, palpation of epigastric area elicits pain.  I provided the patient education regarding epigastric pain.  I suspect stress ulcer.  She is a very nervous, anxious, stressed person.  I provided her education regarding PPI therapy and the role of carafate.  I drew pictures for her.   2. Fatigue.  I suspect her fatigue is secondary to depression, but I will rule out a few other causes as well.  I will check a TSH and her ferritin level today.  I suspect her ferritin level will increase since her IV feraheme 1 week ago, but it has not likely reached its climax.  In [facility], a TSH has not been checked. I have encouraged her to continue with her Lexapro.  I provided her education regarding depression.  Since she is due for her follow-up appointment on Thursday, I will cancel that appointment.  She will be due to transition to 1.5 mg/kg once daily dosing of Lovenox.  As a result, I have escribed this medication for 2 months.  We will cancel her appointment and reschedule it for 1 week later.    BP 110/56  Pulse 80  Temp(Src) 98.1 F (36.7 C) (Oral)  Resp 18  SpO2 100%  LMP 03/15/2013 Gen: NAD, tearful at times.  HEENT: Atraumatic Skin: Warm and dry Cardiac: RRR without murmur, rub, or gallop.  No S3 or S4 Lungs: CTA B/L Abd: +  BS x 4 quadrants.  Tenderness on palpation of epigastric region Extremities: No erythema, heat, or pain.  No edema Neuro: No focal deficits.  Assessment: 1. Tearful 2. Depression 3. Fatigue, multifactorial. 4. VTE, PE resolved.  On Lovenox.  UE DVT will need to be re-evaluated with [place] 3 months for her last test.  5. Gastric ulcer.  Plan: 1. Patient education regarding VTE 2. Patient education regarding depression 3. Patient education regarding fatigue 4. Patient education regarding Gastric ulcer 5. I personally reviewed and went over radiographic studies with the patient.  The results are noted within this dictation.   6. Lasb today: Ferritin, TSH 7. Rx for Lovenox 90 mg once daily, to start 24 hours after BID dosing completion 8. Rx for Tramadol.  She has not filled Oxycodone given to her in the past. Medication list updated.  9. Rx for Prilosec 20 mg daily 10. Rx for Carafate four times daily 11. Cancell appointment on Thursday 12.  Return in 4 weeks for follow-up.  Patient and plan discussed with Dr. Farrel Gobble and he is in agreement with the aforementioned.   60 minutes was spent in face-to-face discussion.  70 minutes spent on total encounter.   Victoria Stein 03/28/2013

## 2013-03-28 NOTE — Progress Notes (Signed)
Victoria Stein's reason for visit today is for labs as scheduled per MD orders.  Venipuncture performed with a 23 gauge butterfly needle to right hand.  Victoria Stein tolerated procedure well and without incident; questions were answered and patient was discharged.

## 2013-03-28 NOTE — Telephone Encounter (Signed)
Pt called and stated that she was having symptoms of "another blood clot", it started on Friday and she went to the ed.  Was told it was acid reflux. She is concerned that she may be bleeding internally. Tom pa notified and pt. To be seen at 11am today.

## 2013-03-29 ENCOUNTER — Telehealth (HOSPITAL_COMMUNITY): Payer: Self-pay

## 2013-03-29 NOTE — Telephone Encounter (Signed)
Baird Cancer, PA-C       Sent: Tue March 29, 2013 3:38 PM    To: Mellissa Kohut, RN                   Message     Nothing to worry about. Update [place] if it worsens or continues. If gross blood is noted, report to ED.   Patient notified and verbalized understanding of instructions.

## 2013-03-29 NOTE — Telephone Encounter (Signed)
Call from patient stating "Last night I had a couple of coughing episodes and I coughed up thick white mucus that was pink tinged.  Have not been able to get the carafate yet and wondered if this could have been caused by the prilosec?  Mucus is still whitish but not seeing anymore blood tinge in it.  Is there anything that I need to do?"  Denies any dyspnea or vigorous coughing.  Can be reached at [phone].

## 2013-03-31 ENCOUNTER — Ambulatory Visit (HOSPITAL_COMMUNITY): Payer: BC Managed Care – PPO

## 2013-04-12 ENCOUNTER — Encounter: Payer: Self-pay | Admitting: Gastroenterology

## 2013-04-20 ENCOUNTER — Encounter: Payer: Self-pay | Admitting: *Deleted

## 2013-04-24 NOTE — Progress Notes (Signed)
Victoria Kilts, MD [address] [place] [address]  Acute pulmonary embolism - Plan: CBC with Differential, D-dimer, quantitative, Ferritin, Anti-parietal antibody, Intrinsic Factor Antibodies, CBC with Differential, D-dimer, quantitative, CT Angio Chest PE W/Cm &/Or Wo Cm, Anti-parietal antibody, Intrinsic Factor Antibodies, CBC with Differential, D-dimer, quantitative, CANCELED: CBC with Differential, CANCELED: Ferritin, CANCELED: D-dimer, quantitative  DVT of upper extremity (deep vein thrombosis) - Plan: CBC with Differential, D-dimer, quantitative, Ferritin, Anti-parietal antibody, Intrinsic Factor Antibodies, CBC with Differential, D-dimer, quantitative, US Venous Img Upper Uni Left, Anti-parietal antibody, Intrinsic Factor Antibodies, CBC with Differential, D-dimer, quantitative, CANCELED: CBC with Differential, CANCELED: Ferritin, CANCELED: D-dimer, quantitative  Iron deficiency anemia, unspecified - Plan: Ferritin, Anti-parietal antibody, Intrinsic Factor Antibodies, Anti-parietal antibody, Intrinsic Factor Antibodies, CANCELED: Ferritin  Acute anxiety  CURRENT THERAPY: Lovenox 1.5 mg/kg daily beginning on 03/08/2013 and she will take for 3-6 months.  INTERVAL HISTORY: Victoria Stein 43 y.o. female returns for  regular  visit for followup of veinous thrombosis with outpatient Xarelto failure after symptomatic pulmonary embolism development.   Xarelto was discontinued and Lovenox at 1 mg per kilogram twice a day was started as an inpatient on 03/08/2013. Inpatient evaluation revealed evidence of iron deficiency as well. The patient did have intravenous line in that arm while undergoing resection of a benign parotid tumor. The pulmonary embolism was diagnosed on 03/03/2013.  She will require anticoagulation x 3-6 months.   I personally reviewed and went over laboratory results with the patient.  The results are noted within this dictation.  She saw her PCP  recently who started Lexapro.  About 3 weeks later, she reports that she really started to notice a positive change in her disposition, but family members noted an improvement in her depression about 2 weeks later.  Today she is seen smiling and laughing.  He affect is positive.  She looks and feels great.  She asks if she needs to continue her PO iron and Carafate.  I think it is reasonable for her to stop both and if she were to have issues in the future, she can re-start either of them.  Hematologically, she denies any complaints and ROS questioning is negative.  She denies any new erythema, heat, or edema in her extremities.  She denies any acute SOB or pleuritic chest pain.  She does note intermittent left lower arm pain that comes and goes and resolves spontaneously.    Past Medical History  Diagnosis Date  . Normal cardiac stress test 2009    normal coronary CT scan  . Parotid mass 02/2013     Benign; status post resection by Dr. Constance Holster  . DVT of upper extremity (deep vein thrombosis) 02/2013    Associated with IV needle  . Acute pulmonary embolism 03/08/2013  . Microcytic anemia 03/09/2013    Ferritin 8   . Chronic anxiety   . Diastolic dysfunction 06/07/8313    Grade 2  . Depression     has Acute pulmonary embolism; DVT of upper extremity (deep vein thrombosis); Acute anxiety; Iron deficiency anemia, unspecified; and Diastolic dysfunction on her problem list.     is allergic to amoxicillin.  Ms. Hartney had no medications administered during this visit.  Past Surgical History  Procedure Laterality Date  . Wisdom tooth extraction    . Parotid gland tumor excision      Denies any headaches, dizziness, double vision, fevers, chills, night sweats, nausea, vomiting, diarrhea, constipation, chest pain, heart  palpitations, shortness of breath, blood in stool, black tarry stool, urinary pain, urinary burning, urinary frequency, hematuria.   PHYSICAL EXAMINATION  ECOG PERFORMANCE  STATUS: 1 - Symptomatic but completely ambulatory  Filed Vitals:   04/25/13 1038  BP: 91/60  Pulse: 79  Temp: 98.2 F (36.8 C)  Resp: 16    GENERAL:alert, healthy, no distress, well nourished, well developed, comfortable, cooperative and smiling SKIN: skin color, texture, turgor are normal, no rashes or significant lesions HEAD: Normocephalic, No masses, lesions, tenderness or abnormalities EYES: normal, PERRLA, EOMI, Conjunctiva are pink and non-injected EARS: External ears normal OROPHARYNX:mucous membranes are moist  NECK: supple, trachea midline LYMPH:  not examined BREAST:not examined LUNGS: not examined HEART: not examined ABDOMEN:not examined BACK: Back symmetric, no curvature. EXTREMITIES:less then 2 second capillary refill, no joint deformities, effusion, or inflammation, no edema, no skin discoloration, no clubbing, no cyanosis  NEURO: alert & oriented x 3 with fluent speech, no focal motor/sensory deficits, gait normal   LABORATORY DATA: CBC    Component Value Date/Time   WBC 6.3 03/26/2013 2038   RBC 4.13 03/26/2013 2038   HGB 9.9* 03/26/2013 2038   HCT 31.9* 03/26/2013 2038   PLT 212 03/26/2013 2038   MCV 77.2* 03/26/2013 2038   MCH 24.0* 03/26/2013 2038   MCHC 31.0 03/26/2013 2038   RDW 17.1* 03/26/2013 2038   LYMPHSABS 1.6 03/26/2013 2038   MONOABS 0.9 03/26/2013 2038   EOSABS 0.0 03/26/2013 2038   BASOSABS 0.0 03/26/2013 2038      Chemistry      Component Value Date/Time   NA 142 03/26/2013 2038   K 4.1 03/26/2013 2038   CL 101 03/26/2013 2038   CO2 25 03/26/2013 2038   BUN 6 03/26/2013 2038   CREATININE 0.65 03/26/2013 2038      Component Value Date/Time   CALCIUM 9.9 03/26/2013 2038   ALKPHOS 68 03/26/2013 2038   AST 46* 03/26/2013 2038   ALT 43* 03/26/2013 2038   BILITOT 0.2* 03/26/2013 2038     Lab Results  Component Value Date   TSH 2.125 03/28/2013   Lab Results  Component Value Date   DDIMER 0.30 03/23/2013     ASSESSMENT:  1. Brachial vein  thrombosis with pulmonary embolism, tolerating Lovenox well, no evidence of acquired or congenital thrombophilia. 2. Anxiety. 3. Iron deficiency, S/P IV Feraheme 1020 mg on 03/23/2013  Patient Active Problem List   Diagnosis Date Noted  . Diastolic dysfunction 40/34/7425  . Iron deficiency anemia, unspecified 03/09/2013  . Acute pulmonary embolism 03/08/2013  . DVT of upper extremity (deep vein thrombosis) 03/08/2013  . Acute anxiety 03/08/2013    PLAN:  1. I personally reviewed and went over laboratory results with the patient.  The results are noted within this dictation. 2. Labs today: CBC diff, D-Dimer, anti-parietal cell antibody, intrinsic factor antibody 3. Labs in June 2015: CBC diff, D-Dimer, Ferritin 4. CT angio of chest PE study the first week of June 2015 5. US venous doppler study of left UE the first week of June 2015 6. Hold Carafate and continue PPI 7. Hold PO iron 8. Continue Lexapro per PCP 9. Return the first week in June to review results of test and make anticoagulation recommendations.    THERAPY PLAN:  We will continue Lovenox 1.5 mg/kg daily into June 2015.  I will order imaging studies and lab work the first week in June 2015.  If all studies are negative for continued VTE, we will discontinued Lovenox and  discharge the patient from the clinic to follow-up with her PCP.  All questions were answered. The patient knows to call the clinic with any problems, questions or concerns. We can certainly see the patient much sooner if necessary.  Patient and plan discussed with Dr. Farrel Gobble and he is in agreement with the aforementioned.   Baird Cancer 04/25/2013

## 2013-04-25 ENCOUNTER — Encounter (HOSPITAL_COMMUNITY): Payer: Self-pay | Admitting: Oncology

## 2013-04-25 ENCOUNTER — Encounter (HOSPITAL_BASED_OUTPATIENT_CLINIC_OR_DEPARTMENT_OTHER): Payer: BC Managed Care – PPO

## 2013-04-25 ENCOUNTER — Ambulatory Visit (HOSPITAL_COMMUNITY): Payer: BC Managed Care – PPO

## 2013-04-25 ENCOUNTER — Encounter (HOSPITAL_COMMUNITY): Payer: BC Managed Care – PPO | Attending: Hematology and Oncology | Admitting: Oncology

## 2013-04-25 VITALS — BP 91/60 | HR 79 | Temp 98.2°F | Resp 16 | Wt 134.9 lb

## 2013-04-25 DIAGNOSIS — F419 Anxiety disorder, unspecified: Secondary | ICD-10-CM

## 2013-04-25 DIAGNOSIS — F411 Generalized anxiety disorder: Secondary | ICD-10-CM

## 2013-04-25 DIAGNOSIS — I2699 Other pulmonary embolism without acute cor pulmonale: Secondary | ICD-10-CM

## 2013-04-25 DIAGNOSIS — D509 Iron deficiency anemia, unspecified: Secondary | ICD-10-CM

## 2013-04-25 DIAGNOSIS — I82629 Acute embolism and thrombosis of deep veins of unspecified upper extremity: Secondary | ICD-10-CM

## 2013-04-25 LAB — CBC WITH DIFFERENTIAL/PLATELET
BASOS PCT: 0 % (ref 0–1)
Basophils Absolute: 0 10*3/uL (ref 0.0–0.1)
Eosinophils Absolute: 0 10*3/uL (ref 0.0–0.7)
Eosinophils Relative: 1 % (ref 0–5)
HEMATOCRIT: 37.7 % (ref 36.0–46.0)
HEMOGLOBIN: 12 g/dL (ref 12.0–15.0)
Lymphocytes Relative: 30 % (ref 12–46)
Lymphs Abs: 1.1 10*3/uL (ref 0.7–4.0)
MCH: 27.3 pg (ref 26.0–34.0)
MCHC: 31.8 g/dL (ref 30.0–36.0)
MCV: 85.7 fL (ref 78.0–100.0)
MONO ABS: 0.4 10*3/uL (ref 0.1–1.0)
MONOS PCT: 10 % (ref 3–12)
NEUTROS ABS: 2.2 10*3/uL (ref 1.7–7.7)
Neutrophils Relative %: 59 % (ref 43–77)
Platelets: 137 10*3/uL — ABNORMAL LOW (ref 150–400)
RBC: 4.4 MIL/uL (ref 3.87–5.11)
RDW: 20.3 % — ABNORMAL HIGH (ref 11.5–15.5)
WBC: 3.7 10*3/uL — ABNORMAL LOW (ref 4.0–10.5)

## 2013-04-25 LAB — D-DIMER, QUANTITATIVE (NOT AT ARMC): D DIMER QUANT: 0.32 ug{FEU}/mL (ref 0.00–0.48)

## 2013-04-25 NOTE — Progress Notes (Signed)
Labs drawn today for cbc/diff,dimer,anti parietal antibody,intrinsic factor antibodies

## 2013-04-25 NOTE — Patient Instructions (Signed)
[facility] Discharge Instructions  RECOMMENDATIONS MADE BY THE CONSULTANT AND ANY TEST RESULTS WILL BE SENT TO YOUR REFERRING PHYSICIAN.  EXAM FINDINGS BY THE PHYSICIAN TODAY AND SIGNS OR SYMPTOMS TO REPORT TO CLINIC OR PRIMARY PHYSICIAN: Exam and findings as discussed by Robynn Pane, PA-C.  Will check some labs today and again in June.  Will also get CT angiogram of your chest and ultrasound of your left upper extremity.  If all normal will stop the lovenox.  Follow-up with GI as scheduled.  MEDICATIONS PRESCRIBED:  Continue lovenox Can stop the iron and carafate  INSTRUCTIONS/FOLLOW-UP: Labs and xrays in June and follow-up after scans.  Thank you for choosing [facility] to provide your oncology and hematology care.  To afford each patient quality time with our providers, please arrive at least 15 minutes before your scheduled appointment time.  With your help, our goal is to use those 15 minutes to complete the necessary work-up to ensure our physicians have the information they need to help with your evaluation and healthcare recommendations.    Effective January 1st, 2014, we ask that you re-schedule your appointment with our physicians should you arrive 10 or more minutes late for your appointment.  We strive to give you quality time with our providers, and arriving late affects you and other patients whose appointments are after yours.    Again, thank you for choosing [facility].  Our hope is that these requests will decrease the amount of time that you wait before being seen by our physicians.       _____________________________________________________________  Should you have questions after your visit to [facility], please contact our office at [phone] between the hours of 8:30 a.m. and 5:00 p.m.  Voicemails left after 4:30 p.m. will not be returned until the following business day.  For prescription refill  requests, have your pharmacy contact our office with your prescription refill request.

## 2013-04-28 LAB — ANTI-PARIETAL ANTIBODY: Parietal Cell Antibody-IgG: NEGATIVE

## 2013-04-28 LAB — INTRINSIC FACTOR ANTIBODIES: INTRINSIC FACTOR: NEGATIVE

## 2013-05-10 ENCOUNTER — Ambulatory Visit: Payer: BC Managed Care – PPO | Admitting: Gastroenterology

## 2013-05-25 ENCOUNTER — Ambulatory Visit: Payer: BC Managed Care – PPO | Admitting: Gastroenterology

## 2013-05-27 ENCOUNTER — Other Ambulatory Visit (HOSPITAL_COMMUNITY): Payer: Self-pay | Admitting: Hematology and Oncology

## 2013-05-27 DIAGNOSIS — I2699 Other pulmonary embolism without acute cor pulmonale: Secondary | ICD-10-CM

## 2013-05-27 DIAGNOSIS — I82629 Acute embolism and thrombosis of deep veins of unspecified upper extremity: Secondary | ICD-10-CM

## 2013-05-27 MED ORDER — ENOXAPARIN SODIUM 150 MG/ML ~~LOC~~ SOLN: 90.0000 mg | SUBCUTANEOUS | Status: DC

## 2013-06-05 NOTE — Progress Notes (Signed)
Victoria Kilts, MD [place] [address]  Acute pulmonary embolism - Plan: CBC with Differential, D-dimer, quantitative, Ferritin, CANCELED: CBC with Differential  DVT of upper extremity (deep vein thrombosis) - Plan: CBC with Differential, D-dimer, quantitative, Ferritin, CANCELED: CBC with Differential  Iron deficiency anemia, unspecified - Plan: Ferritin, CANCELED: CBC with Differential, CANCELED: Ferritin  CURRENT THERAPY: At three month anniversary of anticoagulation with Lovenox 1.5 mg/kg daily beginning on 03/08/2013 and yesterday was her last day of anticoagulation (06/06/2013)  INTERVAL HISTORY: Victoria Stein 43 y.o. female returns for  regular  visit for followup of veinous thrombosis with outpatient Xarelto failure after symptomatic pulmonary embolism development. Xarelto was discontinued and Lovenox at 1 mg per kilogram twice a day was started as an inpatient on 03/08/2013. Inpatient evaluation revealed evidence of iron deficiency as well. The patient did have intravenous line in that arm while undergoing resection of a benign parotid tumor. The pulmonary embolism was diagnosed on 03/03/2013. She is S/P 3 months of anticoagulation.   I personally reviewed and went over laboratory results with the patient.  The results are noted within this dictation.  I personally reviewed and went over radiographic studies with the patient.  The results are noted within this dictation.  CT angio of chest demonstrates: No evidence of acute pulmonary thromboembolism.  US venous doppler of left UE demonstrates: No evidence of deep venous thrombosis. Previously noted nonocclusive thrombus in the brachial vein and superficial thrombus in cephalic and basilic veins has resolved.  With this information, we will release the patient from the clinic.  She requests some departing lab work which we will do and fax to Dr. Hilma Favors, her primary care provider.    She was given education  regarding VTE signs and symptoms to monitor for.  Hematologically, she denies any complaints and ROS questioning is negative.   Past Medical History  Diagnosis Date  . Normal cardiac stress test 2009    normal coronary CT scan  . Parotid mass 02/2013     Benign; status post resection by Dr. Constance Holster  . DVT of upper extremity (deep vein thrombosis) 02/2013    Associated with IV needle  . Acute pulmonary embolism 03/08/2013  . Microcytic anemia 03/09/2013    Ferritin 8   . Chronic anxiety   . Diastolic dysfunction 05/10/7320    Grade 2  . Depression     has Acute pulmonary embolism; DVT of upper extremity (deep vein thrombosis); Acute anxiety; Iron deficiency anemia, unspecified; and Diastolic dysfunction on her problem list.     is allergic to amoxicillin.  Victoria Stein does not currently have medications on file.  Past Surgical History  Procedure Laterality Date  . Wisdom tooth extraction    . Parotid gland tumor excision      Denies any headaches, dizziness, double vision, fevers, chills, night sweats, nausea, vomiting, diarrhea, constipation, chest pain, heart palpitations, shortness of breath, blood in stool, black tarry stool, urinary pain, urinary burning, urinary frequency, hematuria.   PHYSICAL EXAMINATION  ECOG PERFORMANCE STATUS: 0 - Asymptomatic  There were no vitals filed for this visit.  GENERAL:alert, healthy, no distress, well nourished, well developed, comfortable, cooperative and smiling SKIN: skin color, texture, turgor are normal, no rashes or significant lesions HEAD: Normocephalic, No masses, lesions, tenderness or abnormalities EYES: normal, PERRLA, EOMI, Conjunctiva are pink and non-injected EARS: External ears normal OROPHARYNX:mucous membranes are moist  NECK: supple, trachea midline LYMPH:  not examined BREAST:not examined LUNGS: not  examined HEART: not examined ABDOMEN:not examined BACK: not examined EXTREMITIES:no joint deformities, effusion, or  inflammation, no edema, no skin discoloration, no clubbing, no cyanosis  NEURO: alert & oriented x 3 with fluent speech, no focal motor/sensory deficits, gait normal    LABORATORY DATA: CBC    Component Value Date/Time   WBC 3.7* 04/25/2013 1105   RBC 4.40 04/25/2013 1105   HGB 12.0 04/25/2013 1105   HCT 37.7 04/25/2013 1105   PLT 137* 04/25/2013 1105   MCV 85.7 04/25/2013 1105   MCH 27.3 04/25/2013 1105   MCHC 31.8 04/25/2013 1105   RDW 20.3* 04/25/2013 1105   LYMPHSABS 1.1 04/25/2013 1105   MONOABS 0.4 04/25/2013 1105   EOSABS 0.0 04/25/2013 1105   BASOSABS 0.0 04/25/2013 1105      Chemistry      Component Value Date/Time   NA 142 03/26/2013 2038   K 4.1 03/26/2013 2038   CL 101 03/26/2013 2038   CO2 25 03/26/2013 2038   BUN 6 03/26/2013 2038   CREATININE 0.65 03/26/2013 2038      Component Value Date/Time   CALCIUM 9.9 03/26/2013 2038   ALKPHOS 68 03/26/2013 2038   AST 46* 03/26/2013 2038   ALT 43* 03/26/2013 2038   BILITOT 0.2* 03/26/2013 2038     Lab Results  Component Value Date   IRON 27* 03/09/2013   TIBC 439 03/09/2013   FERRITIN 940* 03/28/2013      RADIOGRAPHIC STUDIES:  Ct Angio Chest Pe W/cm &/or Wo Cm  06/06/2013   CLINICAL DATA:  Pulmonary embolism  EXAM: CT ANGIOGRAPHY CHEST WITH CONTRAST  TECHNIQUE: Multidetector CT imaging of the chest was performed using the standard protocol during bolus administration of intravenous contrast. Multiplanar CT image reconstructions and MIPs were obtained to evaluate the vascular anatomy.  CONTRAST:  118mL OMNIPAQUE IOHEXOL 350 MG/ML SOLN  COMPARISON:  03/26/2013  FINDINGS: There are no filling defects in the pulmonary arterial tree to suggest acute pulmonary thromboembolism. Specifically, there is no evidence of pulmonary thromboembolism in the anterior right upper lobe subsegmental pulmonary artery branches.  No evidence of abnormal mediastinal adenopathy.  No pleural effusion.  No pneumothorax.  Clear lungs.  Tiny hypodensity in the right  lobe of the liver on image 85 is stable.  Thoracic spine is intact.  Review of the MIP images confirms the above findings.  IMPRESSION: No evidence of acute pulmonary thromboembolism.   Electronically Signed   By: Maryclare Bean M.D.   On: 06/06/2013 13:30   US Venous Img Upper Uni Left  06/06/2013   CLINICAL DATA:  History of left brachial vein DVT and cephalic superficial thrombophlebitis.  EXAM: LEFT UPPER EXTREMITY VENOUS DOPPLER ULTRASOUND  TECHNIQUE: Gray-scale sonography with graded compression, as well as color Doppler and duplex ultrasound were performed to evaluate the upper extremity deep venous system from the level of the subclavian vein and including the jugular, axillary, basilic, radial, ulnar and upper cephalic vein. Spectral Doppler was utilized to evaluate flow at rest and with distal augmentation maneuvers.  COMPARISON:  None.  FINDINGS: Internal Jugular Vein: No evidence of thrombus. Normal compressibility, respiratory phasicity and response to augmentation.  Subclavian Vein: No evidence of thrombus. Normal compressibility, respiratory phasicity and response to augmentation.  Axillary Vein: No evidence of thrombus. Normal compressibility, respiratory phasicity and response to augmentation.  Cephalic Vein: No evidence of thrombus. Normal compressibility, respiratory phasicity and response to augmentation.  Basilic Vein: No evidence of thrombus. Normal compressibility, respiratory phasicity and response to  augmentation.  Brachial Veins: No evidence of thrombus. Normal compressibility, respiratory phasicity and response to augmentation.  Radial Veins: No evidence of thrombus. Normal compressibility, respiratory phasicity and response to augmentation.  Ulnar Veins: No evidence of thrombus. Normal compressibility, respiratory phasicity and response to augmentation.  Venous Reflux:  None visualized.  Other Findings: No evidence of superficial thrombophlebitis or abnormal fluid collection.  IMPRESSION: No  evidence of deep venous thrombosis. Previously noted nonocclusive thrombus in the brachial vein and superficial thrombus in cephalic and basilic veins has resolved.   Electronically Signed   By: Aletta Edouard M.D.   On: 06/06/2013 16:30       ASSESSMENT:  1. Brachial DVT of left upper extremity, on 03/03/2013.  Resolved on [place] of left UE on 06/06/2013 2. Acute PE on CT angio of chest on 03/08/2013.  Resolved on CT angio of chest on 06/06/2013. 3. Negative hypercoagulability 4. Anxiety 5. Iron deficiency, S/P IV Feraheme 1020 mg on 03/23/2013.  Patient Active Problem List   Diagnosis Date Noted  . Diastolic dysfunction 68/11/5724  . Iron deficiency anemia, unspecified 03/09/2013  . Acute pulmonary embolism 03/08/2013  . DVT of upper extremity (deep vein thrombosis) 03/08/2013  . Acute anxiety 03/08/2013     PLAN:  1. I personally reviewed and went over laboratory results with the patient.  The results are noted within this dictation. 2. I personally reviewed and went over radiographic studies with the patient.  The results are noted within this dictation.   3. Labs today: CBC diff, ferritin, D-Dimer 4. Follow-up with primary care provider, Dr. Hilma Favors, as directed. 5. Release from clinic.   THERAPY PLAN:  She has tolerated anticoagulation for 3 months and repeat imaging is negative on 06/06/2013 (Ct angio of chest andf [place] of left UE).  She is to D/C Lovenox and follow-up with her primary care provider as directed.  All questions were answered. The patient knows to call the clinic with any problems, questions or concerns. We can certainly see the patient much sooner if necessary.  Patient and plan discussed with Dr. Farrel Gobble and he is in agreement with the aforementioned.   [facility] Cancer 06/07/2013

## 2013-06-06 ENCOUNTER — Other Ambulatory Visit (HOSPITAL_COMMUNITY): Payer: BC Managed Care – PPO

## 2013-06-06 ENCOUNTER — Ambulatory Visit (HOSPITAL_COMMUNITY)
Admission: RE | Admit: 2013-06-06 | Discharge: 2013-06-06 | Disposition: A | Discharge status: Home / Self Care | Payer: BC Managed Care – PPO | Source: Ambulatory Visit | Attending: Oncology | Admitting: Oncology

## 2013-06-06 ENCOUNTER — Encounter (HOSPITAL_COMMUNITY): Payer: Self-pay

## 2013-06-06 DIAGNOSIS — I82629 Acute embolism and thrombosis of deep veins of unspecified upper extremity: Secondary | ICD-10-CM

## 2013-06-06 DIAGNOSIS — I2699 Other pulmonary embolism without acute cor pulmonale: Secondary | ICD-10-CM

## 2013-06-06 DIAGNOSIS — Z86711 Personal history of pulmonary embolism: Secondary | ICD-10-CM | POA: Insufficient documentation

## 2013-06-06 MED ORDER — IOHEXOL 350 MG/ML SOLN
100.0000 mL | Freq: Once | INTRAVENOUS | Status: AC | PRN
  Administered 2013-06-06: 100 mL via INTRAVENOUS

## 2013-06-07 ENCOUNTER — Encounter (HOSPITAL_COMMUNITY): Payer: BC Managed Care – PPO | Attending: Hematology and Oncology | Admitting: Oncology

## 2013-06-07 ENCOUNTER — Ambulatory Visit (HOSPITAL_COMMUNITY): Payer: BC Managed Care – PPO | Admitting: Oncology

## 2013-06-07 VITALS — BP 113/76 | HR 85 | Temp 98.1°F | Resp 18 | Wt 138.0 lb

## 2013-06-07 DIAGNOSIS — I82629 Acute embolism and thrombosis of deep veins of unspecified upper extremity: Secondary | ICD-10-CM | POA: Insufficient documentation

## 2013-06-07 DIAGNOSIS — D509 Iron deficiency anemia, unspecified: Secondary | ICD-10-CM | POA: Insufficient documentation

## 2013-06-07 DIAGNOSIS — I2699 Other pulmonary embolism without acute cor pulmonale: Secondary | ICD-10-CM | POA: Insufficient documentation

## 2013-06-07 DIAGNOSIS — I519 Heart disease, unspecified: Secondary | ICD-10-CM

## 2013-06-07 LAB — CBC WITH DIFFERENTIAL/PLATELET
BASOS ABS: 0 10*3/uL (ref 0.0–0.1)
BASOS PCT: 0 % (ref 0–1)
Eosinophils Absolute: 0.1 10*3/uL (ref 0.0–0.7)
Eosinophils Relative: 1 % (ref 0–5)
HCT: 37.7 % (ref 36.0–46.0)
Hemoglobin: 12.5 g/dL (ref 12.0–15.0)
Lymphocytes Relative: 36 % (ref 12–46)
Lymphs Abs: 1.8 10*3/uL (ref 0.7–4.0)
MCH: 29.3 pg (ref 26.0–34.0)
MCHC: 33.2 g/dL (ref 30.0–36.0)
MCV: 88.3 fL (ref 78.0–100.0)
MONOS PCT: 11 % (ref 3–12)
Monocytes Absolute: 0.5 10*3/uL (ref 0.1–1.0)
NEUTROS ABS: 2.6 10*3/uL (ref 1.7–7.7)
NEUTROS PCT: 52 % (ref 43–77)
PLATELETS: 194 10*3/uL (ref 150–400)
RBC: 4.27 MIL/uL (ref 3.87–5.11)
RDW: 16.5 % — AB (ref 11.5–15.5)
WBC: 5.1 10*3/uL (ref 4.0–10.5)

## 2013-06-07 LAB — D-DIMER, QUANTITATIVE (NOT AT ARMC)

## 2013-06-07 NOTE — Progress Notes (Signed)
Victoria Stein presented for [employer]. Labs per MD order drawn via Peripheral Line 23 gauge needle inserted in right anterior forearm.  Good blood return present. Procedure without incident.  Needle removed intact. Patient tolerated procedure well.

## 2013-06-07 NOTE — Patient Instructions (Signed)
[facility] Discharge Instructions  RECOMMENDATIONS MADE BY THE CONSULTANT AND ANY TEST RESULTS WILL BE SENT TO YOUR REFERRING PHYSICIAN.  Labs today: CBC diff, ferritin, D-Dimer  Follow-up with primary care provider, Dr. Hilma Favors, as directed.  Release from clinic.   Thank you for choosing [facility] to provide your oncology and hematology care.  To afford each patient quality time with our providers, please arrive at least 15 minutes before your scheduled appointment time.  With your help, our goal is to use those 15 minutes to complete the necessary work-up to ensure our physicians have the information they need to help with your evaluation and healthcare recommendations.    Effective January 1st, 2014, we ask that you re-schedule your appointment with our physicians should you arrive 10 or more minutes late for your appointment.  We strive to give you quality time with our providers, and arriving late affects you and other patients whose appointments are after yours.    Again, thank you for choosing [facility].  Our hope is that these requests will decrease the amount of time that you wait before being seen by our physicians.       _____________________________________________________________  Should you have questions after your visit to [facility], please contact our office at [phone] between the hours of 8:30 a.m. and 5:00 p.m.  Voicemails left after 4:30 p.m. will not be returned until the following business day.  For prescription refill requests, have your pharmacy contact our office with your prescription refill request.

## 2013-06-08 LAB — FERRITIN: Ferritin: 56 ng/mL (ref 10–291)

## 2013-06-17 ENCOUNTER — Ambulatory Visit (HOSPITAL_COMMUNITY)
Admission: RE | Admit: 2013-06-17 | Discharge: 2013-06-17 | Disposition: A | Discharge status: Home / Self Care | Payer: BC Managed Care – PPO | Source: Ambulatory Visit | Attending: Family Medicine | Admitting: Family Medicine

## 2013-06-17 ENCOUNTER — Other Ambulatory Visit (HOSPITAL_COMMUNITY): Payer: Self-pay | Admitting: Family Medicine

## 2013-06-17 DIAGNOSIS — I82629 Acute embolism and thrombosis of deep veins of unspecified upper extremity: Secondary | ICD-10-CM

## 2013-06-17 DIAGNOSIS — Z86718 Personal history of other venous thrombosis and embolism: Secondary | ICD-10-CM | POA: Insufficient documentation

## 2013-06-17 DIAGNOSIS — M79609 Pain in unspecified limb: Secondary | ICD-10-CM | POA: Insufficient documentation

## 2013-06-23 ENCOUNTER — Encounter: Payer: Self-pay | Admitting: Gastroenterology

## 2013-06-23 ENCOUNTER — Ambulatory Visit (INDEPENDENT_AMBULATORY_CARE_PROVIDER_SITE_OTHER): Payer: BC Managed Care – PPO | Admitting: Gastroenterology

## 2013-06-23 VITALS — BP 105/74 | HR 64 | Temp 98.1°F | Ht 64.0 in | Wt 139.0 lb

## 2013-06-23 DIAGNOSIS — D509 Iron deficiency anemia, unspecified: Secondary | ICD-10-CM

## 2013-06-23 DIAGNOSIS — R7989 Other specified abnormal findings of blood chemistry: Secondary | ICD-10-CM

## 2013-06-23 DIAGNOSIS — R945 Abnormal results of liver function studies: Secondary | ICD-10-CM

## 2013-06-23 DIAGNOSIS — K219 Gastro-esophageal reflux disease without esophagitis: Secondary | ICD-10-CM | POA: Insufficient documentation

## 2013-06-23 MED ORDER — OMEPRAZOLE 20 MG PO CPDR: 20.0000 mg | DELAYED_RELEASE_CAPSULE | Freq: Every day | ORAL | Status: DC

## 2013-06-23 NOTE — Patient Instructions (Signed)
1. We will touch base first of next week once I receive copy of labs from your PCP and review your CT scan. 2. Continue omeprazole daily.

## 2013-06-23 NOTE — Progress Notes (Signed)
Primary Care Physician:  Purvis Kilts, MD  Primary Gastroenterologist:  Garfield Cornea, MD   Chief Complaint  Patient presents with  . Gastrophageal Reflux    HPI:  Victoria Stein is a 43 y.o. female here for further evaluation of GERD at the request of her PCP. She has PMH significant for benign parotid tumor s/p resection in 02/2013. Surgery complicated by upper extremity DVT thought to be related to IV placed in left arm. She was started on Xarelto at time of diagnosis. Several days later she presented with chest pain and found to have PE. It was not clear if she had PE prior to starting Xarelto or if she had Xarelto failure. She was then switched to Lovenox. She was also found to have IDA during inpatient status. Patient recently was advised to stop Lovenox after she had negative venous doppler of left upper extremity and CTA Chest. She was very anxious about stopping the Lovenox. Five days after stopping it she noticed her arm turning red and swelling. Her PCP advised her to restart Lovenox and arranged her to see Dr. Tressie Stalker this coming week. She also had repeat venous study on 06/17/13 that was negative.     She admits to lots of anxiety related to her illnesses this year. She was referred to [place] after episode of chest pain associated with small volume hematemesis. She has been seen in the ER for these symptoms. She had similar symptoms for several week even after documentation the PE had resolved. These symptoms are better on omeprazole. She also notes she her menstrual cycle a week early which concerns her. She asked multiple times about possibility of having internal bleeding related to her Lovenox injection which she noted golf ball size bruising.    Doing much better with heartburn. Sometimes Coke or other carbonated beverages causes burning. No dysphagia. No abdomianl pain. She complains of LUQ pain which she initially noted in 10/2012. She really did not pay attention to it until she  noted that it went away after starting Lovenox. Symptoms recurred after coming off Lovenox but have now gone away again after restarting Lovenox. No melena,brbpr.    Current Outpatient Prescriptions  Medication Sig Dispense Refill  . ALPRAZolam (XANAX) 0.5 MG tablet Take 0.5 mg by mouth every 6 (six) hours as needed for anxiety.      . enoxaparin (LOVENOX) 150 MG/ML injection Inject 0.6 mLs (90 mg total) into the skin daily.  30 Syringe  1  . escitalopram (LEXAPRO) 10 MG tablet Take 5-10 mg by mouth every morning. Take 1/2 tablet daily x 7 days then 1 tablet daily.      Marland Kitchen omeprazole (PRILOSEC) 20 MG capsule Take 1 capsule (20 mg total) by mouth daily.  30 capsule  3   No current facility-administered medications for this visit.    Allergies as of 06/23/2013 - Review Complete 06/23/2013  Allergen Reaction Noted  . Amoxicillin Swelling 01/11/2013    Past Medical History  Diagnosis Date  . Normal cardiac stress test 2009    normal coronary CT scan  . Parotid mass 02/2013     Benign; status post resection by Dr. Constance Holster  . DVT of upper extremity (deep vein thrombosis) 02/2013    Associated with IV needle  . Acute pulmonary embolism 03/08/2013  . Microcytic anemia 03/09/2013    Ferritin 8   . Chronic anxiety   . Diastolic dysfunction 0/03/4740    Grade 2  . Depression  Past Surgical History  Procedure Laterality Date  . Wisdom tooth extraction    . Parotid gland tumor excision      Family History  Problem Relation Age of Onset  . Diabetes Mother   . Heart disease Father     History   Social History  . Marital Status: Married    Spouse Name: N/A    Number of Children: N/A  . Years of Education: N/A   Occupational History  . Not on file.   Social History Main Topics  . Smoking status: Never Smoker   . Smokeless tobacco: Never Used     Comment: Never smoked  . Alcohol Use: No  . Drug Use: No  . Sexual Activity: Yes    Birth Control/ Protection: None   Other Topics  Concern  . Not on file   Social History Narrative  . No narrative on file      ROS:  General: Negative for anorexia, weight loss, fever, chills. c/o fatigue. Eyes: Negative for vision changes.  ENT: Negative for hoarseness, difficulty swallowing , nasal congestion. CV: Negative for chest pain, angina, palpitations, dyspnea on exertion, peripheral edema.  Respiratory: Negative for dyspnea at rest, dyspnea on exertion, cough, sputum, wheezing.  GI: See history of present illness. GU:  Negative for dysuria, hematuria, urinary incontinence, urinary frequency, nocturnal urination.  MS: Negative for joint pain, low back pain.  Derm: Negative for rash or itching.  Neuro: Negative for weakness, abnormal sensation, seizure, frequent headaches, memory loss, confusion.  Psych: Negative for  depression, suicidal ideation, hallucinations. +++anxiety related to her illness Endo: Negative for unusual weight change.  Heme: Negative for bruising or bleeding. Allergy: Negative for rash or hives.    Physical Examination:  BP 105/74  Pulse 64  Temp(Src) 98.1 F (36.7 C) (Oral)  Ht 5\' 4"  (1.626 m)  Wt 139 lb (63.05 kg)  BMI 23.85 kg/m2  LMP 05/30/2013   General: Well-nourished, well-developed in no acute distress. Very anxious but able to reassure her. Head: Normocephalic, atraumatic.   Eyes: Conjunctiva pink, no icterus. Mouth: Oropharyngeal mucosa moist and pink , no lesions erythema or exudate. Neck: Supple without thyromegaly, masses, or lymphadenopathy.  Lungs: Clear to auscultation bilaterally.  Heart: Regular rate and rhythm, no murmurs rubs or gallops.  Abdomen: Bowel sounds are normal, nondistended, no hepatosplenomegaly or masses, no abdominal bruits or    hernia , no rebound or guarding. Small golf ball sized old bruise in right mid-abd. No significant hematoma. Some tenderness in the left upper quadrant with palpation. Rectal: not performed Extremities: No lower extremity edema.  No clubbing or deformities.  Neuro: Alert and oriented x 4 , grossly normal neurologically.  Skin: Warm and dry, no rash or jaundice.   Psych: Alert and cooperative, normal mood and affect.  Labs: Lab Results  Component Value Date   WBC 5.1 06/07/2013   HGB 12.5 06/07/2013   HCT 37.7 06/07/2013   MCV 88.3 06/07/2013   PLT 194 06/07/2013   Lab Results  Component Value Date   FERRITIN 56 06/07/2013   Lab Results  Component Value Date   IRON 27* 03/09/2013   TIBC 439 03/09/2013   FERRITIN 56 06/07/2013   Lab Results  Component Value Date   CREATININE 0.65 03/26/2013   BUN 6 03/26/2013   NA 142 03/26/2013   K 4.1 03/26/2013   CL 101 03/26/2013   CO2 25 03/26/2013   Lab Results  Component Value Date   ALT 43* 03/26/2013  AST 46* 03/26/2013   ALKPHOS 68 03/26/2013   BILITOT 0.2* 03/26/2013   01/2013: Hgb 9.8. MCV 76.3 03/26/2013: 9.9. Ferritin 8. PATIENT LATER RECEIVED IRON IV.     Imaging Studies: Ct Angio Chest Pe W/cm &/or Wo Cm  06/06/2013   CLINICAL DATA:  Pulmonary embolism  EXAM: CT ANGIOGRAPHY CHEST WITH CONTRAST  TECHNIQUE: Multidetector CT imaging of the chest was performed using the standard protocol during bolus administration of intravenous contrast. Multiplanar CT image reconstructions and MIPs were obtained to evaluate the vascular anatomy.  CONTRAST:  125mL OMNIPAQUE IOHEXOL 350 MG/ML SOLN  COMPARISON:  03/26/2013  FINDINGS: There are no filling defects in the pulmonary arterial tree to suggest acute pulmonary thromboembolism. Specifically, there is no evidence of pulmonary thromboembolism in the anterior right upper lobe subsegmental pulmonary artery branches.  No evidence of abnormal mediastinal adenopathy.  No pleural effusion.  No pneumothorax.  Clear lungs.  Tiny hypodensity in the right lobe of the liver on image 85 is stable.  Thoracic spine is intact.  Review of the MIP images confirms the above findings.  IMPRESSION: No evidence of acute pulmonary thromboembolism.    Electronically Signed   By: Maryclare Bean M.D.   On: 06/06/2013 13:30   US Venous Img Upper Uni Left  06/17/2013   CLINICAL DATA:  Left upper extremity pain, history of left upper extremity brachial vein DVT - evaluate for acute and/or chronic DVT within left upper extremity  EXAM: LEFT UPPER EXTREMITY VENOUS DOPPLER ULTRASOUND  TECHNIQUE: Gray-scale sonography with graded compression, as well as color Doppler and duplex ultrasound were performed to evaluate the upper extremity deep venous system from the level of the subclavian vein and including the jugular, axillary, basilic, radial, ulnar and upper cephalic vein. Spectral Doppler was utilized to evaluate flow at rest and with distal augmentation maneuvers.  COMPARISON:  Left upper extremity venous Doppler ultrasound - 06/06/2013; 03/08/2013; 03/03/2013  FINDINGS: Internal Jugular Vein: No evidence of thrombus. Normal compressibility, respiratory phasicity and response to augmentation.  Subclavian Vein: No evidence of thrombus. Normal compressibility, respiratory phasicity and response to augmentation.  Axillary Vein: No evidence of thrombus. Normal compressibility, respiratory phasicity and response to augmentation.  Cephalic Vein: No evidence of thrombus. Normal compressibility, respiratory phasicity and response to augmentation, though slightly sluggish flow was noted within the cephalic vein (please refer to acquired cine images)  Basilic Vein: No evidence of thrombus. Normal compressibility, respiratory phasicity and response to augmentation.  Brachial Veins: No evidence of thrombus. Normal compressibility, respiratory phasicity and response to augmentation.  Radial Veins: No evidence of thrombus. Normal compressibility, respiratory phasicity and response to augmentation.  Ulnar Veins: No evidence of thrombus. Normal compressibility, respiratory phasicity and response to augmentation.  Venous Reflux:  None visualized.  Other Findings:  None visualized.   IMPRESSION: No evidence of acute or chronic DVT within the left upper extremity.   Electronically Signed   By: Sandi Mariscal M.D.   On: 06/17/2013 16:50   US Venous Img Upper Uni Left  06/06/2013   CLINICAL DATA:  History of left brachial vein DVT and cephalic superficial thrombophlebitis.  EXAM: LEFT UPPER EXTREMITY VENOUS DOPPLER ULTRASOUND  TECHNIQUE: Gray-scale sonography with graded compression, as well as color Doppler and duplex ultrasound were performed to evaluate the upper extremity deep venous system from the level of the subclavian vein and including the jugular, axillary, basilic, radial, ulnar and upper cephalic vein. Spectral Doppler was utilized to evaluate flow at rest and with distal augmentation  maneuvers.  COMPARISON:  None.  FINDINGS: Internal Jugular Vein: No evidence of thrombus. Normal compressibility, respiratory phasicity and response to augmentation.  Subclavian Vein: No evidence of thrombus. Normal compressibility, respiratory phasicity and response to augmentation.  Axillary Vein: No evidence of thrombus. Normal compressibility, respiratory phasicity and response to augmentation.  Cephalic Vein: No evidence of thrombus. Normal compressibility, respiratory phasicity and response to augmentation.  Basilic Vein: No evidence of thrombus. Normal compressibility, respiratory phasicity and response to augmentation.  Brachial Veins: No evidence of thrombus. Normal compressibility, respiratory phasicity and response to augmentation.  Radial Veins: No evidence of thrombus. Normal compressibility, respiratory phasicity and response to augmentation.  Ulnar Veins: No evidence of thrombus. Normal compressibility, respiratory phasicity and response to augmentation.  Venous Reflux:  None visualized.  Other Findings: No evidence of superficial thrombophlebitis or abnormal fluid collection.  IMPRESSION: No evidence of deep venous thrombosis. Previously noted nonocclusive thrombus in the brachial vein and  superficial thrombus in cephalic and basilic veins has resolved.   Electronically Signed   By: Aletta Edouard M.D.   On: 06/06/2013 16:30

## 2013-06-27 ENCOUNTER — Encounter: Payer: Self-pay | Admitting: Gastroenterology

## 2013-06-27 DIAGNOSIS — R945 Abnormal results of liver function studies: Secondary | ICD-10-CM | POA: Insufficient documentation

## 2013-06-27 DIAGNOSIS — R7989 Other specified abnormal findings of blood chemistry: Secondary | ICD-10-CM | POA: Insufficient documentation

## 2013-06-27 NOTE — Assessment & Plan Note (Signed)
F/U LFTs, if not previously done by PCP. Patient had a small right lobe of liver lesion on CTA Chest. Will review films.

## 2013-06-27 NOTE — Progress Notes (Signed)
cc'd to pcp 

## 2013-06-27 NOTE — Assessment & Plan Note (Signed)
43 y/o female with PMH of DVT/PE as outlined above. IDA with ferritin of 8, Hgb in 9 range, MCV 76 detected earlier this year. Patient subsequently received IV Iron. She is concerned about the etiology of her IDA. She has had some LUQ tenderness for over 6 months. Resolved with Lovenox, ?coincidental vs placebo effect. Patient denies bowel issues. She more recently has had significant chest pain in setting of prior PE and anxiety. She however had small volume hematemesis on one occasion and symptoms improved with Prilosec. At a minimum would offer her EGD and possibly TCS but need to wait to see what plan is regarding her Lovenox. She has second opinion scheduled with Dr. Tressie Stalker. She is very concerned about having underlying problem that has predisposed her to clots. She reports multiple symptoms including fatigue which all resolved with Lovenox injections.   I have requested records from her PCP regarding recent labs. We will await input from Dr. Tressie Stalker. Further recommendations to follow regarding endoscopies. In meantime, she will continue PPI therapy.

## 2013-06-28 ENCOUNTER — Telehealth: Payer: Self-pay

## 2013-06-28 NOTE — Telephone Encounter (Signed)
Pt left a VM and said she was expecting Victoria Stein to get back with her in reference to possibly having an [place]. I returned her call and got VM, and told her I will let Victoria Stein know that she has called but it will probably be tomorrow before we get back with her.

## 2013-06-29 NOTE — Telephone Encounter (Signed)
I left msg for return call. I need to speak with patient.

## 2013-06-29 NOTE — Progress Notes (Signed)
Labs from 06/21/2013 TSH 1.969, vitamin B12 330, sodium 143, potassium 4.3, glucose 72, BUN 12, creatinine 0.8, total bilirubin 0.2, alkaline phosphatase 86, AST 23, ALT 39, albumin 3.8, calcium 9.4.  I personally reviewed her CT with Dr. Thornton Papas, radiologist. She had a small right hepatic lobe liver lesion, 3 mm consistent with a cyst. No further followup needed.  LMOAM for return call to discuss next step. Please let me know when she returns call.

## 2013-07-01 ENCOUNTER — Telehealth: Payer: Self-pay | Admitting: Internal Medicine

## 2013-07-01 DIAGNOSIS — R7989 Other specified abnormal findings of blood chemistry: Secondary | ICD-10-CM

## 2013-07-01 DIAGNOSIS — R945 Abnormal results of liver function studies: Secondary | ICD-10-CM

## 2013-07-01 NOTE — Telephone Encounter (Signed)
Spoke with pt. See other phone note.  

## 2013-07-01 NOTE — Telephone Encounter (Signed)
Pt was returning LSL call from yesterday. LSL hasn't arrived at the office yet, but will let her know that patient had called back for her. Please call [phone]

## 2013-07-01 NOTE — Telephone Encounter (Signed)
Discussed with Dr. Gala Romney. Recommends TCS/EGD +/- SB biopsies for dx:IDA, gerd (new onset). Patient is on Lovenox so this will need to be held 24 hours before the procedure.   Patient has an appointment with Dr. Tressie Stalker on Wednesday and if he agrees to her having the procedures and holding Lovenox, she will call and schedule at that time.   Repeat LFTs in 3 months.   Discussed CT findings with regards to liver lesion with pt.

## 2013-07-05 NOTE — Telephone Encounter (Signed)
Dr. Tressie Stalker asked patient to please wait for TCS/EGD until around September until patient is off of Lovenox

## 2013-07-05 NOTE — Telephone Encounter (Signed)
Lab order on file. Victoria Stein, have you scheduled pt?

## 2013-07-05 NOTE — Addendum Note (Signed)
Addended by: Claudina Lick on: 07/05/2013 01:58 PM   Modules accepted: Orders

## 2013-07-05 NOTE — Telephone Encounter (Signed)
No I was waiting for patient to receive results first

## 2013-07-06 NOTE — Telephone Encounter (Signed)
FYI to Leslie Lewis, PA.  

## 2013-07-15 NOTE — Telephone Encounter (Signed)
OK. Please make appointment in 09/2013 E30, to schedule TCS/EGD +/- SB bx.   If she has appt with Dr. Tressie Stalker around that time, we should have our appt after that.

## 2013-07-18 NOTE — Telephone Encounter (Signed)
Routing to Manuela Schwartz to schedule appt in September.

## 2013-07-20 ENCOUNTER — Encounter: Payer: Self-pay | Admitting: Gastroenterology

## 2013-07-20 NOTE — Telephone Encounter (Signed)
Pt is aware of OV on 09/20/2013 at 0900 with LSL and appt card mailed

## 2013-07-28 ENCOUNTER — Other Ambulatory Visit (HOSPITAL_COMMUNITY): Payer: Self-pay | Admitting: Family Medicine

## 2013-07-28 DIAGNOSIS — N62 Hypertrophy of breast: Secondary | ICD-10-CM

## 2013-08-09 ENCOUNTER — Encounter (HOSPITAL_COMMUNITY): Payer: BC Managed Care – PPO

## 2013-08-18 ENCOUNTER — Other Ambulatory Visit: Payer: Self-pay

## 2013-08-18 DIAGNOSIS — R945 Abnormal results of liver function studies: Secondary | ICD-10-CM

## 2013-08-18 DIAGNOSIS — R7989 Other specified abnormal findings of blood chemistry: Secondary | ICD-10-CM

## 2013-08-23 ENCOUNTER — Other Ambulatory Visit (HOSPITAL_COMMUNITY): Payer: Self-pay | Admitting: Family Medicine

## 2013-08-23 ENCOUNTER — Ambulatory Visit (HOSPITAL_COMMUNITY)
Admission: RE | Admit: 2013-08-23 | Discharge: 2013-08-23 | Disposition: A | Discharge status: Home / Self Care | Payer: BC Managed Care – PPO | Source: Ambulatory Visit | Attending: Family Medicine | Admitting: Family Medicine

## 2013-08-23 DIAGNOSIS — N62 Hypertrophy of breast: Secondary | ICD-10-CM

## 2013-08-23 DIAGNOSIS — N644 Mastodynia: Secondary | ICD-10-CM | POA: Diagnosis not present

## 2013-09-20 ENCOUNTER — Encounter: Payer: Self-pay | Admitting: Gastroenterology

## 2013-09-20 ENCOUNTER — Ambulatory Visit: Payer: BC Managed Care – PPO | Admitting: Gastroenterology

## 2013-09-30 ENCOUNTER — Encounter (HOSPITAL_COMMUNITY): Payer: Self-pay | Admitting: Hematology and Oncology

## 2013-09-30 ENCOUNTER — Other Ambulatory Visit (HOSPITAL_COMMUNITY): Payer: Self-pay | Admitting: Hematology and Oncology

## 2013-11-06 DIAGNOSIS — N938 Other specified abnormal uterine and vaginal bleeding: Secondary | ICD-10-CM

## 2013-11-06 HISTORY — DX: Other specified abnormal uterine and vaginal bleeding: N93.8

## 2013-12-06 HISTORY — PX: ABLATION: SHX5711

## 2014-01-06 DIAGNOSIS — Z86711 Personal history of pulmonary embolism: Secondary | ICD-10-CM

## 2014-01-06 HISTORY — DX: Personal history of pulmonary embolism: Z86.711

## 2014-03-18 IMAGING — US US EXTREM  UP VENOUS*L*
1 series · 13 of 24 positions shown · non-contrast
Comparison: US VENOUS IMG UPPER UNI *L* dated 03/03/2013

CLINICAL DATA: Recent diagnosis of left brachial vein DVT.

EXAM:
LEFT UPPER EXTREMITY VENOUS DOPPLER ULTRASOUND
TECHNIQUE: Gray-scale sonography with graded compression, as well as color
Doppler and duplex ultrasound were performed to evaluate the upper
extremity deep venous system from the level of the subclavian vein
and including the jugular, axillary, basilic and upper cephalic
vein. Spectral Doppler was utilized to evaluate flow at rest and
with distal augmentation maneuvers.

[Series 1: us extrem up venous*left* · 0.05mm/px · 13 of 62 slices shown]
[im 1/62]
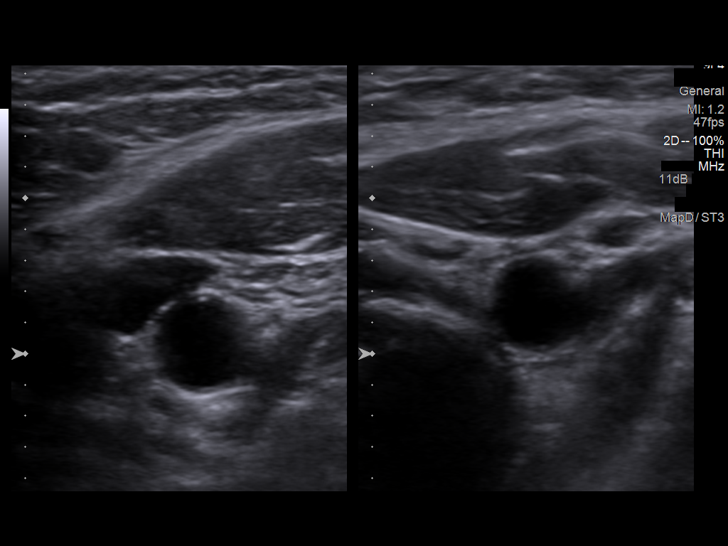
[im 6/62]
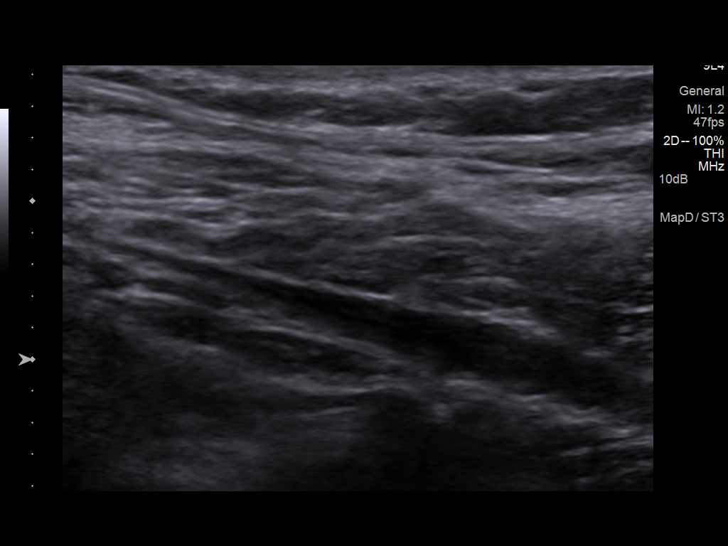
[im 11/62]
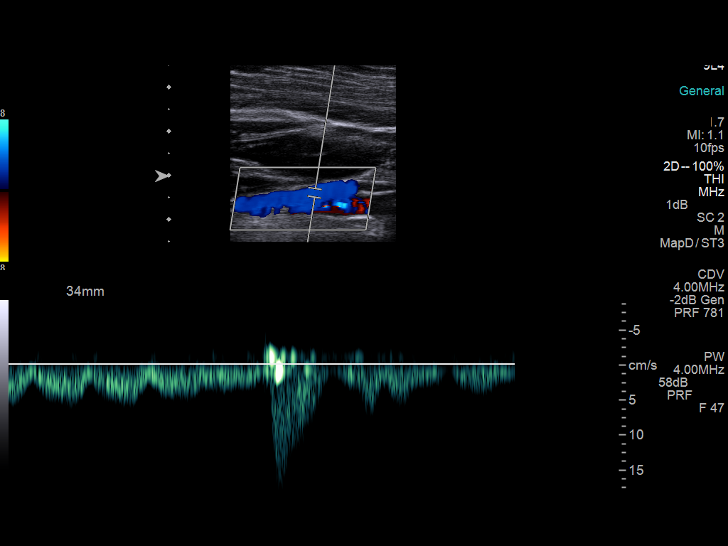
[im 16/62]
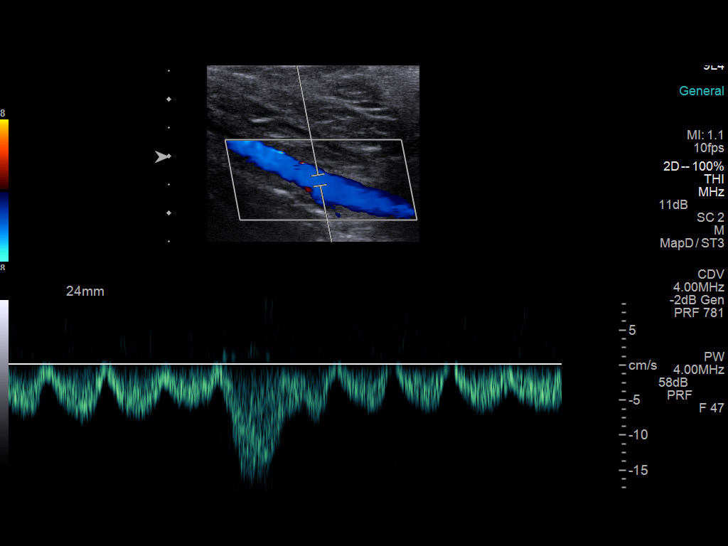
[im 22/62]
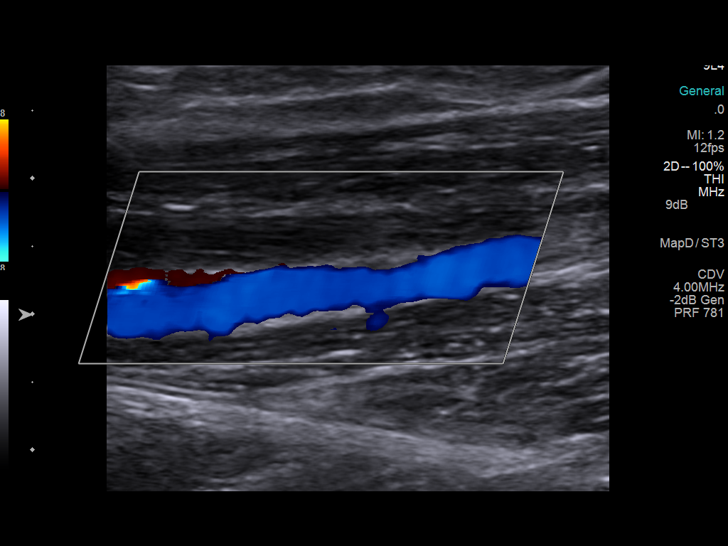
[im 27/62]
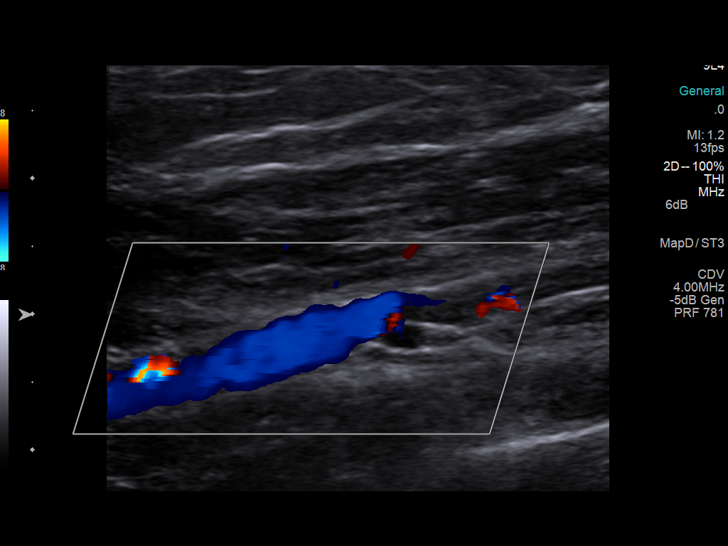
[im 32/62]
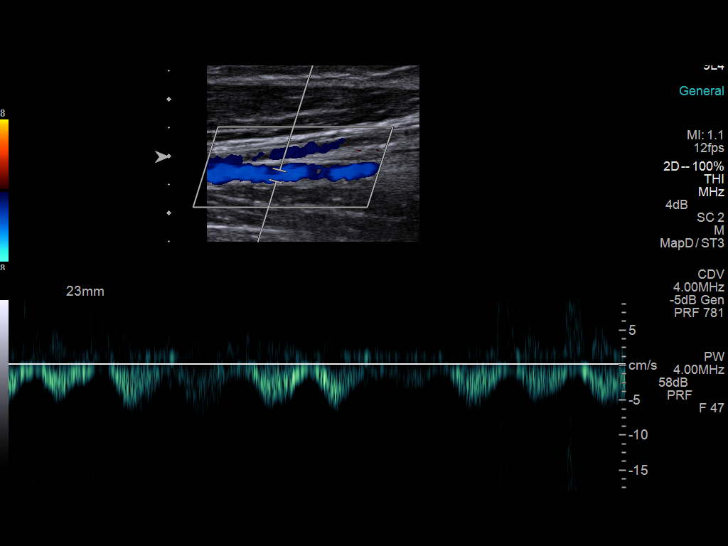
[im 35/62]
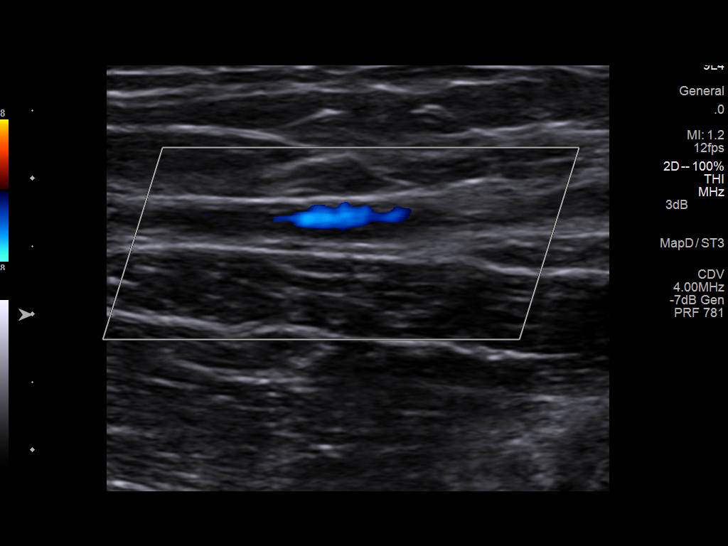
[im 40/62]
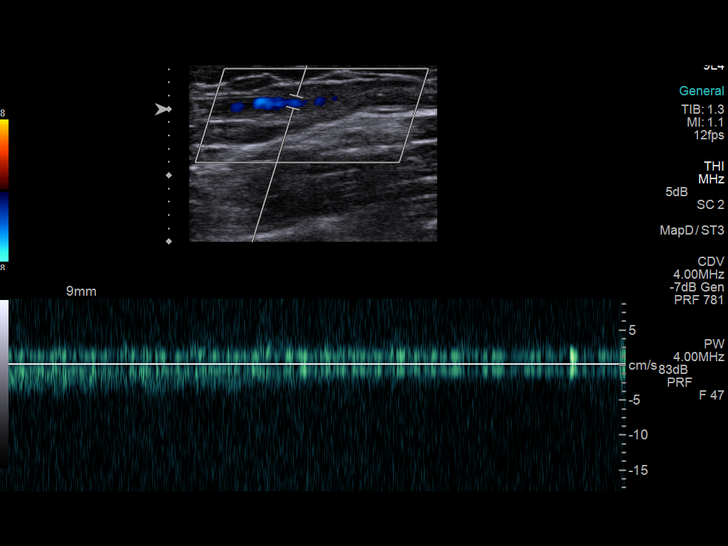
[im 46/62]
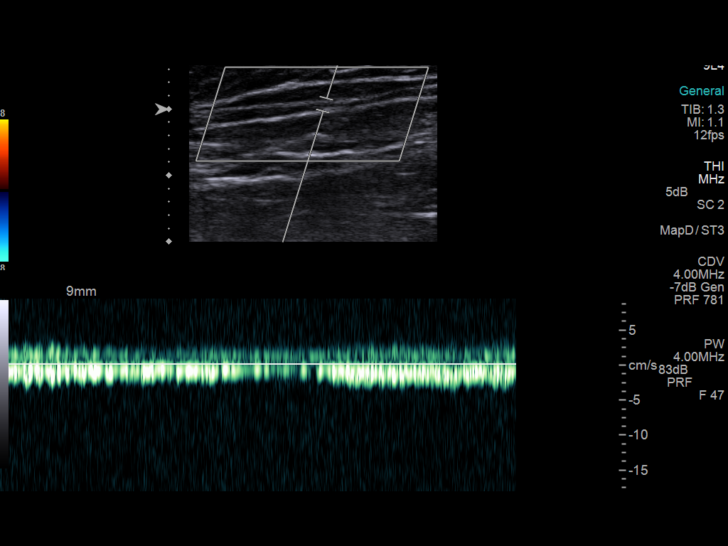
[im 51/62]
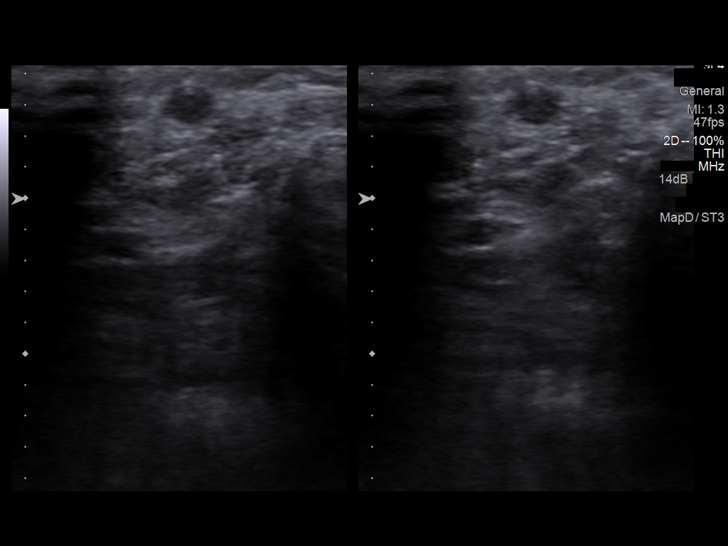
[im 56/62]
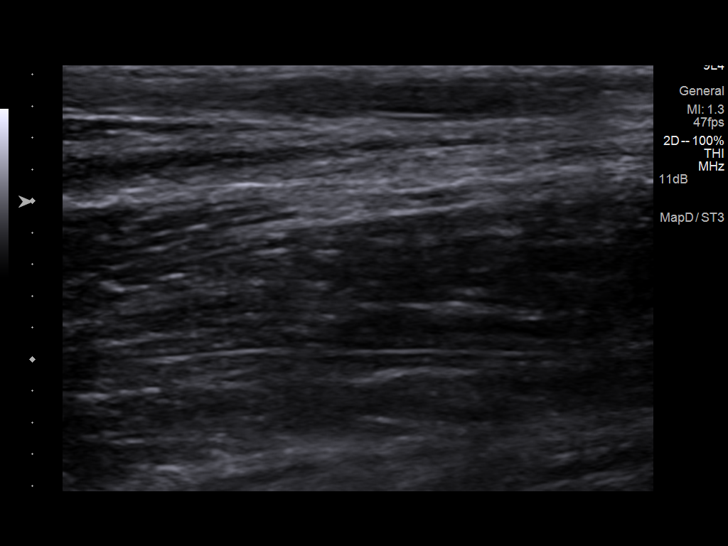
[im 62/62]
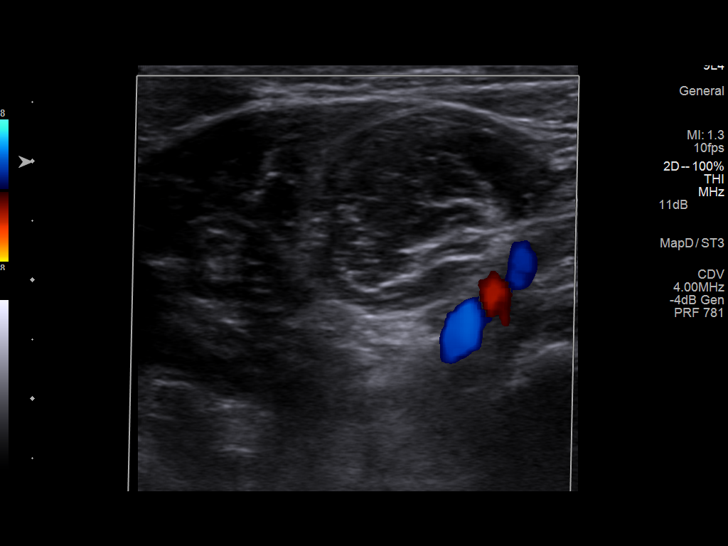

[13 of 24 positions shown; findings below may reference images not displayed]

FINDINGS: Thrombus within deep veins: Need nonocclusive thrombus identified in
1 of 2 paired deep brachial veins of the upper arm demonstrates
improved appearance with some residual mural thrombus remaining.

Venous reflux:  None visualized.

Other findings: Superficial thrombophlebitis with nonocclusive
thrombus in the basilic vein of the upper arm and the cephalic vein
in the forearm again noted. Both veins appear less distended by
thrombus.
IMPRESSION: No propagation of brachial vein DVT or superficial thrombus in the
basilic vein and cephalic vein of the left arm. Nonocclusive
thrombus in the brachial vein appears improved. The cephalic and
basilic veins appear less distended by thrombus.

## 2014-03-18 IMAGING — US US EXTREM LOW VENOUS BILAT
1 series · 14 of 24 positions shown · non-contrast
Comparison: None

CLINICAL DATA: Pulmonary emboli .  Pain, edema.

EXAM:
BILATERAL LOWER EXTREMITY VENOUS DOPPLER ULTRASOUND
TECHNIQUE: Gray-scale sonography with compression, as well as color and duplex
ultrasound, were performed to evaluate the deep venous system from
the level of the common femoral vein through the popliteal and
proximal calf veins.

[Series 1: us extrem low venous bilat · 0.05mm/px · 14 of 71 slices shown]
[im 1/71]
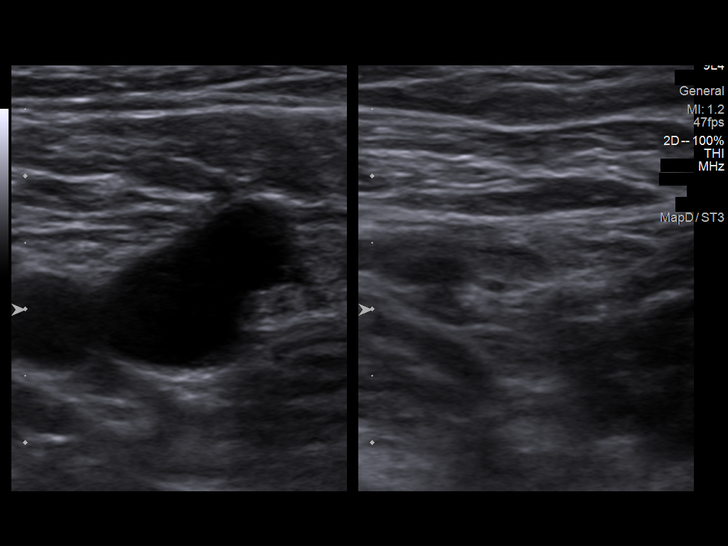
[im 7/71]
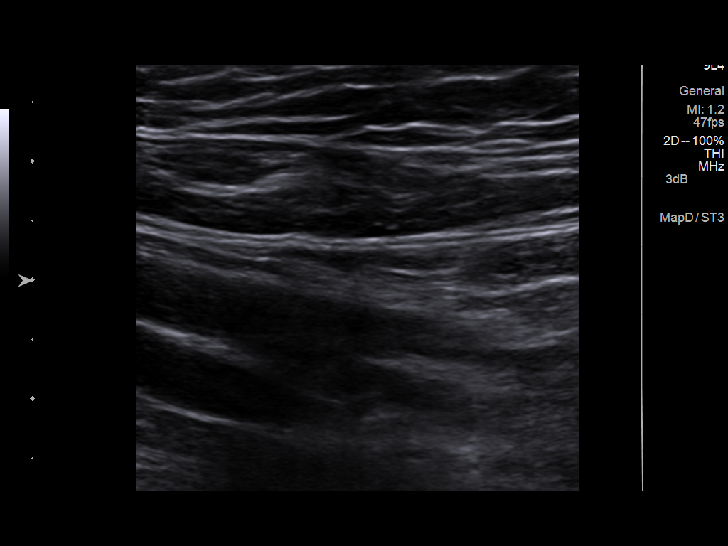
[im 13/71]
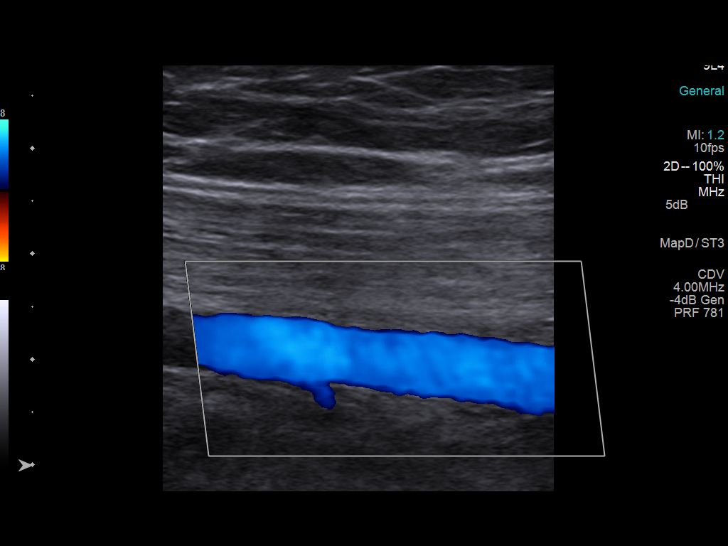
[im 19/71]
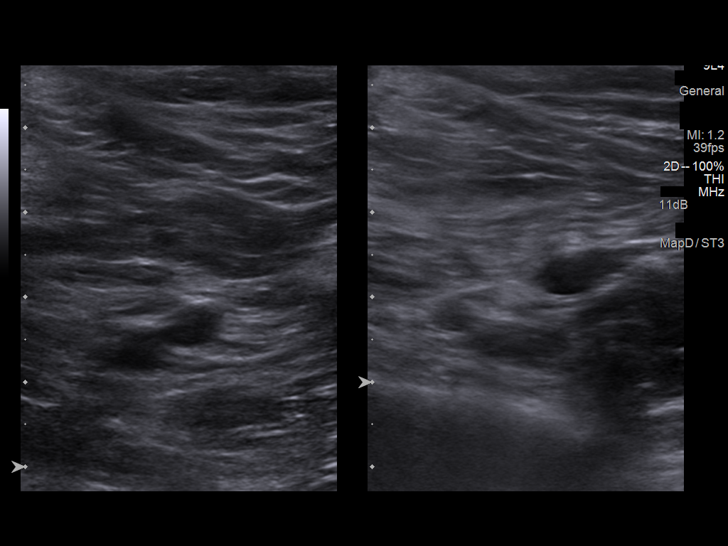
[im 22/71]
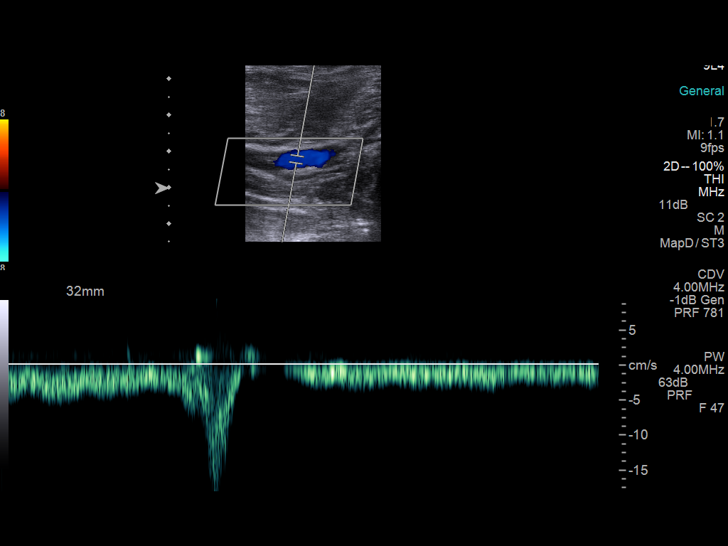
[im 28/71]
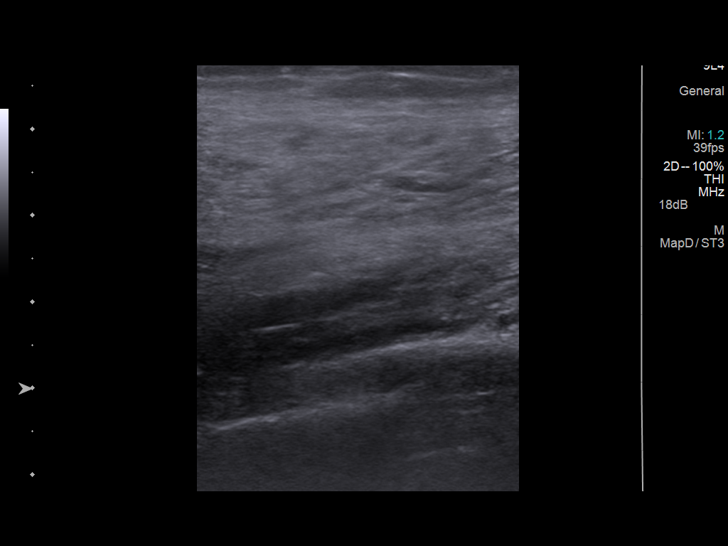
[im 34/71]
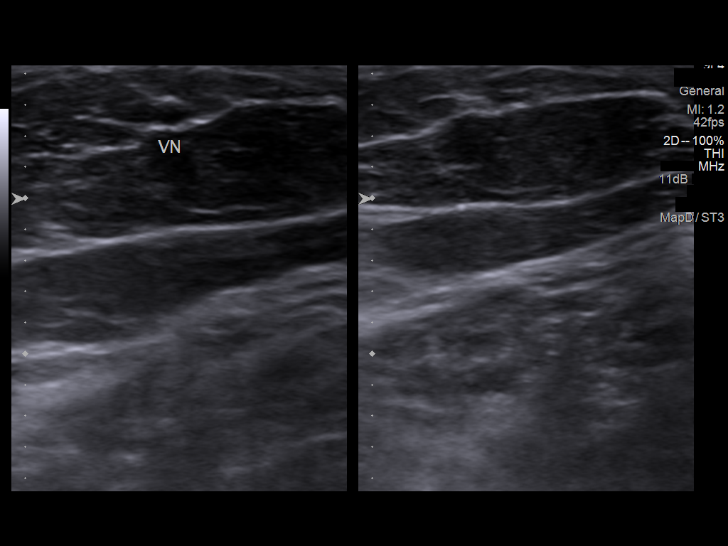
[im 37/71]
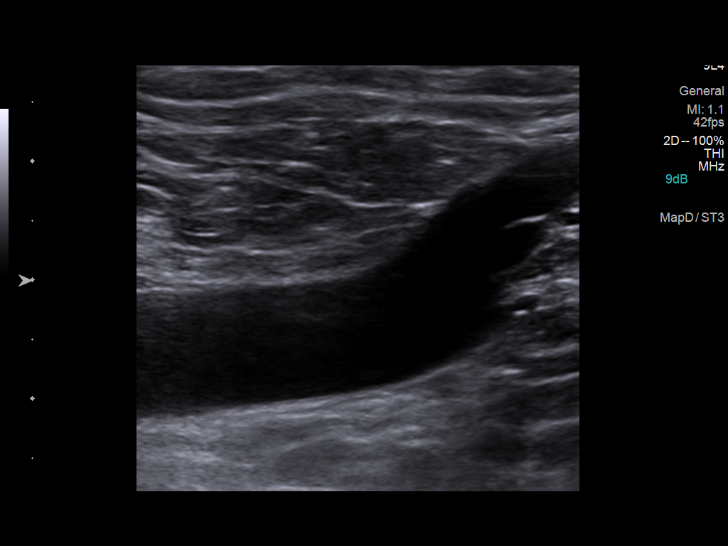
[im 43/71]
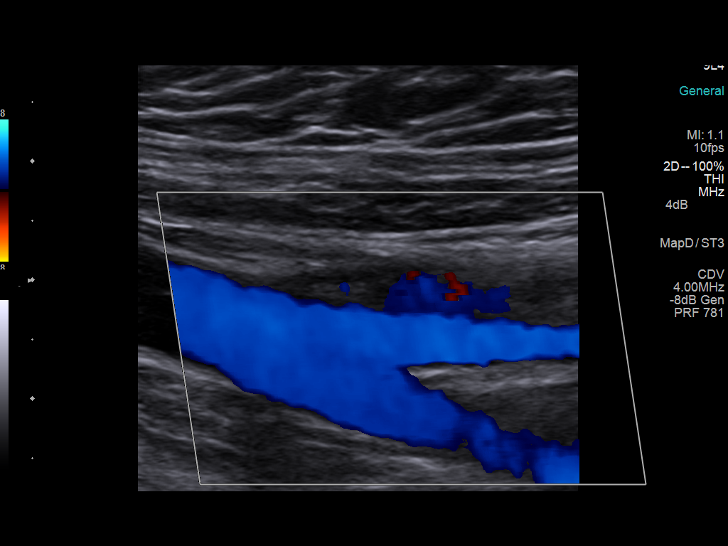
[im 49/71]
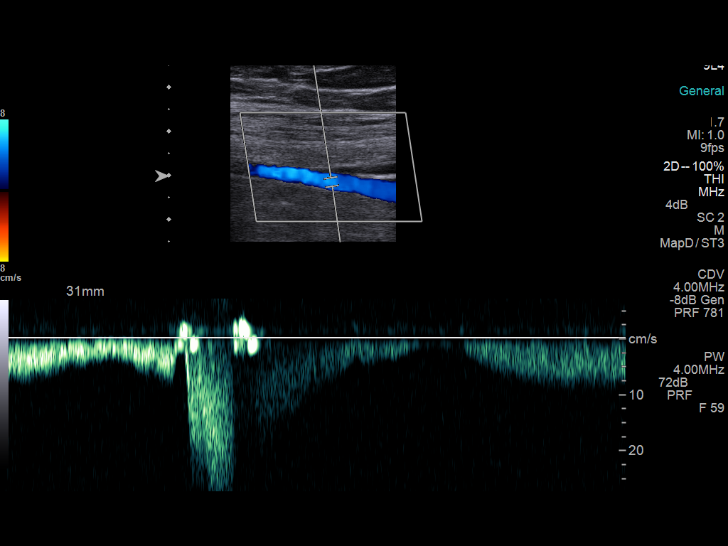
[im 55/71]
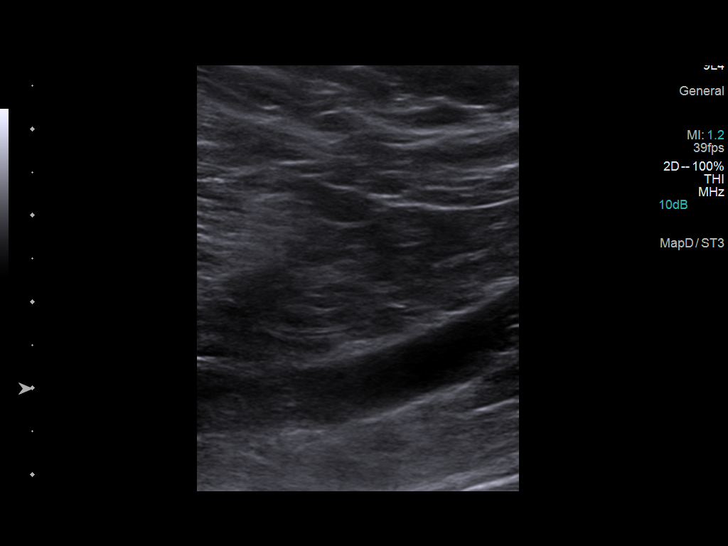
[im 58/71]
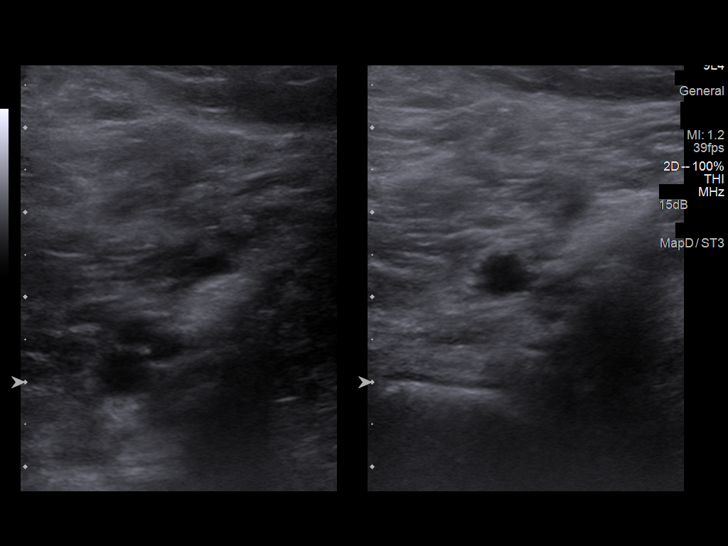
[im 64/71]
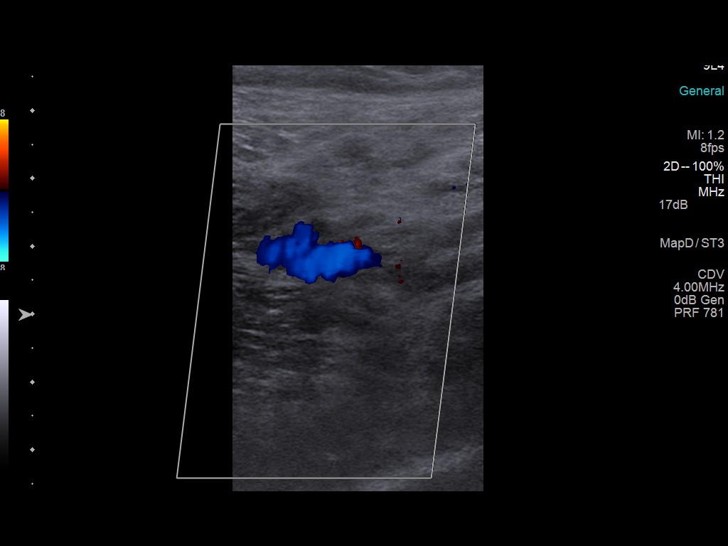
[im 71/71]
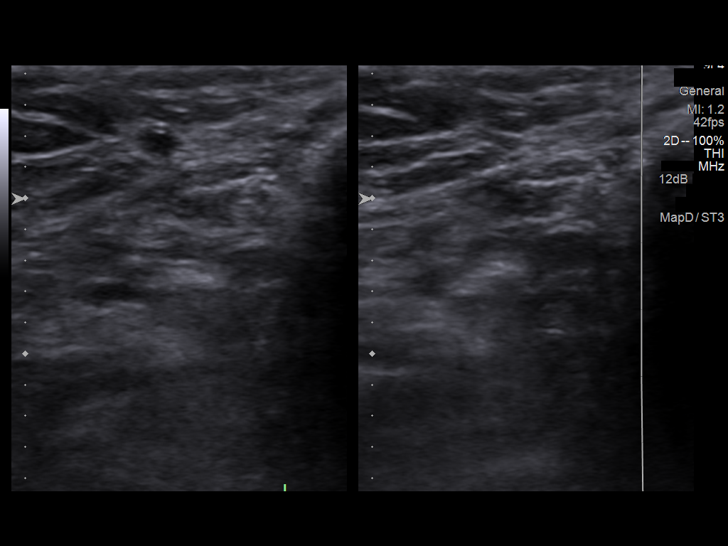

[14 of 24 positions shown; findings below may reference images not displayed]

FINDINGS: Normal compressibility of the common femoral, superficial femoral,
and popliteal veins, as well as the proximal calf veins. No filling
defects to suggest DVT on grayscale or color Doppler imaging.
Doppler waveforms show normal direction of venous flow, normal
respiratory phasicity and response to augmentation. Visualized
segments of saphenous venous system normal in caliber and
compressibility.
IMPRESSION: No evidence of  lower extremity deep vein thrombosis.

## 2015-01-22 ENCOUNTER — Other Ambulatory Visit (HOSPITAL_COMMUNITY): Payer: Self-pay | Admitting: Family Medicine

## 2015-01-22 DIAGNOSIS — Z1231 Encounter for screening mammogram for malignant neoplasm of breast: Secondary | ICD-10-CM

## 2015-02-02 ENCOUNTER — Ambulatory Visit (HOSPITAL_COMMUNITY)
Admission: RE | Admit: 2015-02-02 | Discharge: 2015-02-02 | Disposition: A | Discharge status: Home / Self Care | Payer: BLUE CROSS/BLUE SHIELD | Source: Ambulatory Visit | Attending: Family Medicine | Admitting: Family Medicine

## 2015-02-02 DIAGNOSIS — Z1231 Encounter for screening mammogram for malignant neoplasm of breast: Secondary | ICD-10-CM | POA: Diagnosis not present

## 2015-05-15 DIAGNOSIS — F320 Major depressive disorder, single episode, mild: Secondary | ICD-10-CM | POA: Diagnosis not present

## 2015-05-15 DIAGNOSIS — E6609 Other obesity due to excess calories: Secondary | ICD-10-CM | POA: Diagnosis not present

## 2015-05-15 DIAGNOSIS — Z6831 Body mass index (BMI) 31.0-31.9, adult: Secondary | ICD-10-CM | POA: Diagnosis not present

## 2015-05-15 DIAGNOSIS — Z1389 Encounter for screening for other disorder: Secondary | ICD-10-CM | POA: Diagnosis not present

## 2015-05-24 DIAGNOSIS — Z6830 Body mass index (BMI) 30.0-30.9, adult: Secondary | ICD-10-CM | POA: Diagnosis not present

## 2015-05-24 DIAGNOSIS — Z1389 Encounter for screening for other disorder: Secondary | ICD-10-CM | POA: Diagnosis not present

## 2015-05-24 DIAGNOSIS — N644 Mastodynia: Secondary | ICD-10-CM | POA: Diagnosis not present

## 2015-06-05 DIAGNOSIS — N63 Unspecified lump in breast: Secondary | ICD-10-CM | POA: Diagnosis not present

## 2015-06-13 DIAGNOSIS — D509 Iron deficiency anemia, unspecified: Secondary | ICD-10-CM | POA: Diagnosis not present

## 2015-07-12 DIAGNOSIS — Z6830 Body mass index (BMI) 30.0-30.9, adult: Secondary | ICD-10-CM | POA: Diagnosis not present

## 2015-07-12 DIAGNOSIS — R1032 Left lower quadrant pain: Secondary | ICD-10-CM | POA: Diagnosis not present

## 2015-07-12 DIAGNOSIS — R350 Frequency of micturition: Secondary | ICD-10-CM | POA: Diagnosis not present

## 2015-07-12 DIAGNOSIS — R102 Pelvic and perineal pain: Secondary | ICD-10-CM | POA: Diagnosis not present

## 2015-07-26 DIAGNOSIS — Z1389 Encounter for screening for other disorder: Secondary | ICD-10-CM | POA: Diagnosis not present

## 2015-07-26 DIAGNOSIS — Z0000 Encounter for general adult medical examination without abnormal findings: Secondary | ICD-10-CM | POA: Diagnosis not present

## 2015-07-26 DIAGNOSIS — Z6829 Body mass index (BMI) 29.0-29.9, adult: Secondary | ICD-10-CM | POA: Diagnosis not present

## 2015-08-04 ENCOUNTER — Emergency Department (HOSPITAL_COMMUNITY): Payer: BLUE CROSS/BLUE SHIELD

## 2015-08-04 ENCOUNTER — Encounter (HOSPITAL_COMMUNITY): Payer: Self-pay | Admitting: Emergency Medicine

## 2015-08-04 ENCOUNTER — Emergency Department (HOSPITAL_COMMUNITY)
Admission: EM | Admit: 2015-08-04 | Discharge: 2015-08-04 | Disposition: A | Discharge status: Home / Self Care | Payer: BLUE CROSS/BLUE SHIELD | Attending: Emergency Medicine | Admitting: Emergency Medicine

## 2015-08-04 DIAGNOSIS — Z79899 Other long term (current) drug therapy: Secondary | ICD-10-CM | POA: Diagnosis not present

## 2015-08-04 DIAGNOSIS — N930 Postcoital and contact bleeding: Secondary | ICD-10-CM | POA: Diagnosis not present

## 2015-08-04 DIAGNOSIS — N939 Abnormal uterine and vaginal bleeding, unspecified: Secondary | ICD-10-CM | POA: Diagnosis not present

## 2015-08-04 DIAGNOSIS — R102 Pelvic and perineal pain: Secondary | ICD-10-CM | POA: Diagnosis not present

## 2015-08-04 HISTORY — DX: Other specified abnormal uterine and vaginal bleeding: N93.8

## 2015-08-04 LAB — URINALYSIS, ROUTINE W REFLEX MICROSCOPIC
Bilirubin Urine: NEGATIVE
GLUCOSE, UA: NEGATIVE mg/dL
KETONES UR: NEGATIVE mg/dL
Leukocytes, UA: NEGATIVE
Nitrite: NEGATIVE
Protein, ur: NEGATIVE mg/dL
SPECIFIC GRAVITY, URINE: 1.015 (ref 1.005–1.030)
pH: 6 (ref 5.0–8.0)

## 2015-08-04 LAB — URINE MICROSCOPIC-ADD ON

## 2015-08-04 LAB — WET PREP, GENITAL
Clue Cells Wet Prep HPF POC: NONE SEEN
SPERM: NONE SEEN
Trich, Wet Prep: NONE SEEN
Yeast Wet Prep HPF POC: NONE SEEN

## 2015-08-04 LAB — POC URINE PREG, ED: Preg Test, Ur: NEGATIVE

## 2015-08-04 NOTE — Discharge Instructions (Signed)
Take over the counter tylenol and ibuprofen, as directed on packaging, as needed for discomfort. Apply moist heat to the area(s) of discomfort, for 15 minutes at a time, several times per day for the next few days.  Do not fall asleep on a heating pack. Your ultrasound showed a thickened endometrial lining. Call your regular OB/GYN doctor on Monday to schedule a follow up appointment this week for this finding (you may need a biopsy).  Return to the Emergency Department immediately if worsening.

## 2015-08-04 NOTE — ED Triage Notes (Addendum)
Patient c/o lower abd pain/pelvic pain. Per patient recently had left ovarian cyst rupture on th 20th, evaluated and given Macrobid as a precaution. Patient states that Thursday night had intercourse for first time since cyst ruptured and since then pain with slight bleeding. Patient states has not had period since having an ablation. Patient states "I haven't filled a panty liner up but the bleeding still caused me concern and I called [facility] in which they told me to come here to the ER." Patient reports decrease in pain after urinating. Patient also reports a strong odor to urine.

## 2015-08-04 NOTE — ED Provider Notes (Signed)
[place] DEPT Provider Note   CSN: [id number] Arrival date & time: 08/04/15  1410  First Provider Contact:  First MD Initiated Contact with Patient 08/04/15 1430        History   Chief Complaint Chief Complaint  Patient presents with  . Pelvic Pain    HPI Victoria Stein is a 45 y.o. female.  HPI  Pt was seen at 1430. Per pt, c/o gradual onset and persistence of constant pelvic pain for the past 2 days. Describes the pain as "stabbing." Has been associated with vaginal bleeding; described as seeing blood on toilet paper when wiping and "less than a panty liner." States the pain improves after urinating. States her symptoms began after "having sex with a full bladder." Pt states she was seen by her OB/GYN on 07/12/15 for this complaint and was told she "had a ruptured left ovarian cyst and needed an antibiotic so I didn't get an infection from it."  Denies vaginal discharge, no dysuria/hematuria, no flank pain, no N/V/D, no fevers, no rash.    Past Medical History:  Diagnosis Date  . Acute pulmonary embolism () 03/08/2013  . Chronic anxiety   . Depression   . Diastolic dysfunction 123456   Grade 2  . DUB (dysfunctional uterine bleeding) 11/2013  . DVT of upper extremity (deep vein thrombosis) ([place]) 02/2013   Associated with IV needle  . Microcytic anemia 03/09/2013   Ferritin 8   . Normal cardiac stress test 2009   normal coronary CT scan  . Parotid mass 02/2013    Benign; status post resection by Dr. Constance Holster    Patient Active Problem List   Diagnosis Date Noted  . Abnormal LFTs 06/27/2013  . GERD (gastroesophageal reflux disease) 06/23/2013  . Diastolic dysfunction [id number]  . Iron deficiency anemia due to chronic blood loss 03/09/2013  . Acute pulmonary embolism ([place]) 03/08/2013  . DVT of upper extremity (deep vein thrombosis) ([place]) 03/08/2013  . Acute anxiety 03/08/2013    Past Surgical History:  Procedure Laterality Date  . ABLATION  12/2013  . PAROTID  GLAND TUMOR EXCISION    . WISDOM TOOTH EXTRACTION        Home Medications    Prior to Admission medications   Medication Sig Start Date End Date Taking? Authorizing Provider  ALPRAZolam Duanne Moron) 0.5 MG tablet Take 0.5 mg by mouth every 6 (six) hours as needed for anxiety. 03/18/13   Sol Passer, MD  enoxaparin (LOVENOX) 150 MG/ML injection Inject 0.6 mLs (90 mg total) into the skin daily. 05/27/13   Farrel Gobble, MD  escitalopram (LEXAPRO) 10 MG tablet Take 5-10 mg by mouth every morning. Take 1/2 tablet daily x 7 days then 1 tablet daily.    Historical Provider, MD  omeprazole (PRILOSEC) 20 MG capsule Take 1 capsule (20 mg total) by mouth daily. 06/23/13   Mahala Menghini, PA-C    Family History Family History  Problem Relation Age of Onset  . Diabetes Mother   . Heart disease Father     Social History Social History  Substance Use Topics  . Smoking status: Never Smoker  . Smokeless tobacco: Never Used     Comment: Never smoked  . Alcohol use No     Allergies   Amoxicillin   Review of Systems Review of Systems ROS: Statement: All systems negative except as marked or noted in the HPI; Constitutional: Negative for fever and chills. ; ; Eyes: Negative for eye pain, redness and discharge. ; ; ENMT:  Negative for ear pain, hoarseness, nasal congestion, sinus pressure and sore throat. ; ; Cardiovascular: Negative for chest pain, palpitations, diaphoresis, dyspnea and peripheral edema. ; ; Respiratory: Negative for cough, wheezing and stridor. ; ; Gastrointestinal: Negative for nausea, vomiting, diarrhea, abdominal pain, blood in stool, hematemesis, jaundice and rectal bleeding. . ; ; Genitourinary: Negative for dysuria, flank pain and hematuria. ; ; GYN:  +pelvic pain, +vaginal bleeding, no vaginal discharge, no vulvar pain.;;  Musculoskeletal: Negative for back pain and neck pain. Negative for swelling and trauma.; ; Skin: Negative for pruritus, rash, abrasions, blisters, bruising  and skin lesion.; ; Neuro: Negative for headache, lightheadedness and neck stiffness. Negative for weakness, altered level of consciousness, altered mental status, extremity weakness, paresthesias, involuntary movement, seizure and syncope.       Physical Exam Updated Vital Signs BP 118/81 (BP Location: Left Arm)   Pulse 79   Temp 98.4 F (36.9 C) (Oral)   Resp 18   Ht 5\' 4"  (1.626 m)   Wt 164 lb (74.4 kg)   SpO2 100%   BMI 28.15 kg/m   Physical Exam 1435: Physical examination:  Nursing notes reviewed; Vital signs and O2 SAT reviewed;  Constitutional: Well developed, Well nourished, Well hydrated, In no acute distress; Head:  Normocephalic, atraumatic; Eyes: EOMI, PERRL, No scleral icterus; ENMT: Mouth and pharynx normal, Mucous membranes moist; Neck: Supple, Full range of motion, No lymphadenopathy; Cardiovascular: Regular rate and rhythm, No gallop; Respiratory: Breath sounds clear & equal bilaterally, No wheezes.  Speaking full sentences with ease, Normal respiratory effort/excursion; Chest: Nontender, Movement normal; Abdomen: Soft, Nontender, Nondistended, Normal bowel sounds; Genitourinary: No CVA tenderness. Pelvic exam performed with permission of pt and female ED tech assist during exam.  External genitalia w/o lesions. Vaginal vault without discharge, small amount of dark blood.  Cervix w/o lesions, not friable, GC/chlam and wet prep obtained and sent to lab.  Bimanual exam w/o CMT or adnexal tenderness. +suprapubic tenderness..;; Extremities: Pulses normal, No tenderness, No edema, No calf edema or asymmetry.; Neuro: AA&Ox3, Major CN grossly intact.  Speech clear. No gross focal motor or sensory deficits in extremities.; Skin: Color normal, Warm, Dry.; Psych:  Anxious.    ED Treatments / Results  Labs (all labs ordered are listed, but only abnormal results are displayed)   EKG  EKG Interpretation None       Radiology   Procedures Procedures (including critical care  time)  Medications Ordered in ED Medications - No data to display   Initial Impression / Assessment and Plan / ED Course  I have reviewed the triage vital signs and the nursing notes.  Pertinent labs & imaging results that were available during my care of the patient were reviewed by me and considered in my medical decision making (see chart for details).  MDM Reviewed: previous chart, nursing note and vitals Reviewed previous: ultrasound and labs Interpretation: labs and ultrasound   Results for orders placed or performed during the hospital encounter of 08/04/15  Wet prep, genital  Result Value Ref Range   Yeast Wet Prep HPF POC NONE SEEN NONE SEEN   Trich, Wet Prep NONE SEEN NONE SEEN   Clue Cells Wet Prep HPF POC NONE SEEN NONE SEEN   WBC, Wet Prep HPF POC FEW (A) NONE SEEN   Sperm NONE SEEN   Urinalysis, Routine w reflex microscopic (not at [facility])  Result Value Ref Range   Color, Urine YELLOW YELLOW   APPearance CLEAR CLEAR   Specific Gravity, Urine  1.015 1.005 - 1.030   pH 6.0 5.0 - 8.0   Glucose, UA NEGATIVE NEGATIVE mg/dL   Hgb urine dipstick LARGE (A) NEGATIVE   Bilirubin Urine NEGATIVE NEGATIVE   Ketones, ur NEGATIVE NEGATIVE mg/dL   Protein, ur NEGATIVE NEGATIVE mg/dL   Nitrite NEGATIVE NEGATIVE   Leukocytes, UA NEGATIVE NEGATIVE  Urine microscopic-add on  Result Value Ref Range   Squamous Epithelial / LPF 6-30 (A) NONE SEEN   WBC, UA 0-5 0 - 5 WBC/hpf   RBC / HPF 6-30 0 - 5 RBC/hpf   Bacteria, UA FEW (A) NONE SEEN  POC urine preg, ED  Result Value Ref Range   Preg Test, Ur NEGATIVE NEGATIVE    US Transvaginal Non-ob Result Date: 08/04/2015 CLINICAL DATA:  Post coital bleeding and pain for 2 days EXAM: TRANSABDOMINAL AND TRANSVAGINAL ULTRASOUND OF PELVIS DOPPLER ULTRASOUND OF OVARIES TECHNIQUE: Both transabdominal and transvaginal ultrasound examinations of the pelvis were performed. Transabdominal technique was performed for global imaging of the pelvis  including uterus, ovaries, adnexal regions, and pelvic cul-de-sac. It was necessary to proceed with endovaginal exam following the transabdominal exam to visualize the uterus, endometrium, and ovaries. Color and duplex Doppler ultrasound was utilized to evaluate blood flow to the ovaries. COMPARISON:  None. FINDINGS: Uterus Measurements: 8.9 x 5.0 x 4.5 cm. No fibroids or other mass visualized. Endometrium Thickness: 5.7 mm.  No focal abnormality visualized. Right ovary Measurements: 2.5 x 1.5 x 1.3 cm. There is a 9 mm cyst in the right ovary. Left ovary Measurements: 2.5 x 2.3 x 1.9 cm. There is no suspicious mass seen in the left ovary. Pulsed Doppler evaluation of both ovaries demonstrates normal low-resistance arterial and venous waveforms. Other findings Nabothian cysts in the cervix. IMPRESSION: 1. The endometrial stripe complex measures 5.7 mm. 5 mm is the upper limits of normal in a postmenopausal woman. Given this finding of mild thickening and a history of bleeding, recommend gynecologic consultation with consideration of endometrial biopsy. Electronically Signed   By: Dorise Bullion III M.D   On: 08/04/2015 16:22    [place] Endovaginal (non-OB) performed 07/12/15 at [facility] Vaginal U/S: retroverted uterus, size and shape WNL, no myometrial masses  seen.irreg endom s/p ablation - no fluid collections seen. Both ovaries  appear WNL with normal follicle pattern - 1.8 cm collapsed, resolving  cystic structure on left side. No adnexal masses. No free fluid. [place] Retroflexed uterus, irregular endometrial stripe normal ovaries    1435:  Care Everywhere notes reviewed: Office note states pt presented with pelvic pain, dx resolving corpus luteum cyst and UTI with [place] as above. This reviewed with pt, who verb understanding.   1700:  Work up as above. Pt very anxious. Multiple questions answered; strongly encouraged to f/u with her OB/GYN MD for further testing. Dx and testing d/w pt and family.   Questions answered.  Verb understanding, agreeable to d/c home with outpt f/u.    Final Clinical Impressions(s) / ED Diagnoses   Final diagnoses:  None    New Prescriptions New Prescriptions   No medications on file     Francine Graven, DO 08/07/15 0017

## 2015-08-04 NOTE — ED Notes (Addendum)
Pt had an ablation in December 2015. Pt reports not having a period since 2016. Pt reports having vaginal bleeding after intercourse on Thursday night. Pt reports increased vaginal bleeding and stabbing pelvic pain since last night. Pt reports taking a course of 5 days of Macrobid completed on July 10. Pt reports strong odor to urine today. Denies any vaginal d/c or gi issues.

## 2015-08-06 LAB — GC/CHLAMYDIA PROBE AMP (~~LOC~~) NOT AT ARMC
CHLAMYDIA, DNA PROBE: NEGATIVE
NEISSERIA GONORRHEA: NEGATIVE

## 2015-08-10 DIAGNOSIS — Z1389 Encounter for screening for other disorder: Secondary | ICD-10-CM | POA: Diagnosis not present

## 2015-08-10 DIAGNOSIS — Z6829 Body mass index (BMI) 29.0-29.9, adult: Secondary | ICD-10-CM | POA: Diagnosis not present

## 2015-08-10 DIAGNOSIS — J029 Acute pharyngitis, unspecified: Secondary | ICD-10-CM | POA: Diagnosis not present

## 2015-08-10 DIAGNOSIS — J398 Other specified diseases of upper respiratory tract: Secondary | ICD-10-CM | POA: Diagnosis not present

## 2015-08-22 DIAGNOSIS — N841 Polyp of cervix uteri: Secondary | ICD-10-CM | POA: Diagnosis not present

## 2015-08-22 DIAGNOSIS — N938 Other specified abnormal uterine and vaginal bleeding: Secondary | ICD-10-CM | POA: Diagnosis not present

## 2015-08-22 DIAGNOSIS — Z6829 Body mass index (BMI) 29.0-29.9, adult: Secondary | ICD-10-CM | POA: Diagnosis not present

## 2015-08-23 DIAGNOSIS — N859 Noninflammatory disorder of uterus, unspecified: Secondary | ICD-10-CM | POA: Diagnosis not present

## 2015-08-29 DIAGNOSIS — N939 Abnormal uterine and vaginal bleeding, unspecified: Secondary | ICD-10-CM | POA: Diagnosis not present

## 2015-08-29 DIAGNOSIS — N83291 Other ovarian cyst, right side: Secondary | ICD-10-CM | POA: Diagnosis not present

## 2015-08-29 DIAGNOSIS — Z6829 Body mass index (BMI) 29.0-29.9, adult: Secondary | ICD-10-CM | POA: Diagnosis not present

## 2015-08-29 DIAGNOSIS — N921 Excessive and frequent menstruation with irregular cycle: Secondary | ICD-10-CM | POA: Diagnosis not present

## 2015-09-03 DIAGNOSIS — N921 Excessive and frequent menstruation with irregular cycle: Secondary | ICD-10-CM | POA: Diagnosis not present

## 2015-09-05 DIAGNOSIS — D259 Leiomyoma of uterus, unspecified: Secondary | ICD-10-CM | POA: Diagnosis not present

## 2015-09-05 DIAGNOSIS — N8301 Follicular cyst of right ovary: Secondary | ICD-10-CM | POA: Diagnosis not present

## 2015-09-05 DIAGNOSIS — N921 Excessive and frequent menstruation with irregular cycle: Secondary | ICD-10-CM | POA: Diagnosis not present

## 2015-09-05 DIAGNOSIS — R102 Pelvic and perineal pain: Secondary | ICD-10-CM | POA: Diagnosis not present

## 2015-09-05 DIAGNOSIS — N809 Endometriosis, unspecified: Secondary | ICD-10-CM | POA: Diagnosis not present

## 2015-09-05 DIAGNOSIS — N83201 Unspecified ovarian cyst, right side: Secondary | ICD-10-CM | POA: Diagnosis not present

## 2015-09-06 DIAGNOSIS — R102 Pelvic and perineal pain: Secondary | ICD-10-CM | POA: Diagnosis not present

## 2015-09-06 DIAGNOSIS — N8301 Follicular cyst of right ovary: Secondary | ICD-10-CM | POA: Diagnosis not present

## 2015-09-06 DIAGNOSIS — D259 Leiomyoma of uterus, unspecified: Secondary | ICD-10-CM | POA: Diagnosis not present

## 2015-09-06 DIAGNOSIS — N809 Endometriosis, unspecified: Secondary | ICD-10-CM | POA: Diagnosis not present

## 2015-09-17 ENCOUNTER — Encounter (HOSPITAL_COMMUNITY): Payer: Self-pay | Admitting: Emergency Medicine

## 2015-09-17 ENCOUNTER — Emergency Department (HOSPITAL_COMMUNITY)
Admission: EM | Admit: 2015-09-17 | Discharge: 2015-09-17 | Disposition: A | Discharge status: Home / Self Care | Payer: BLUE CROSS/BLUE SHIELD | Attending: Emergency Medicine | Admitting: Emergency Medicine

## 2015-09-17 DIAGNOSIS — N938 Other specified abnormal uterine and vaginal bleeding: Secondary | ICD-10-CM | POA: Diagnosis not present

## 2015-09-17 DIAGNOSIS — N939 Abnormal uterine and vaginal bleeding, unspecified: Secondary | ICD-10-CM

## 2015-09-17 DIAGNOSIS — Z791 Long term (current) use of non-steroidal anti-inflammatories (NSAID): Secondary | ICD-10-CM | POA: Diagnosis not present

## 2015-09-17 DIAGNOSIS — Z79899 Other long term (current) drug therapy: Secondary | ICD-10-CM | POA: Diagnosis not present

## 2015-09-17 LAB — BASIC METABOLIC PANEL
Anion gap: 6 (ref 5–15)
BUN: 13 mg/dL (ref 6–20)
CALCIUM: 8.7 mg/dL — AB (ref 8.9–10.3)
CO2: 25 mmol/L (ref 22–32)
Chloride: 105 mmol/L (ref 101–111)
Creatinine, Ser: 0.95 mg/dL (ref 0.44–1.00)
GFR calc Af Amer: >60 mL/min (ref >60)
GFR calc non Af Amer: >60 mL/min (ref >60)
GLUCOSE: 105 mg/dL — AB (ref 65–99)
Potassium: 4 mmol/L (ref 3.5–5.1)
Sodium: 136 mmol/L (ref 135–145)

## 2015-09-17 LAB — WET PREP, GENITAL
Clue Cells Wet Prep HPF POC: NONE SEEN
Sperm: NONE SEEN
Trich, Wet Prep: NONE SEEN
Yeast Wet Prep HPF POC: NONE SEEN

## 2015-09-17 LAB — CBC WITH DIFFERENTIAL/PLATELET
Basophils Absolute: 0 10*3/uL (ref 0.0–0.1)
Basophils Relative: 0 %
EOS PCT: 1 %
Eosinophils Absolute: 0.1 10*3/uL (ref 0.0–0.7)
HCT: 38.7 % (ref 36.0–46.0)
Hemoglobin: 12.8 g/dL (ref 12.0–15.0)
LYMPHS ABS: 2 10*3/uL (ref 0.7–4.0)
Lymphocytes Relative: 26 %
MCH: 30.4 pg (ref 26.0–34.0)
MCHC: 33.1 g/dL (ref 30.0–36.0)
MCV: 91.9 fL (ref 78.0–100.0)
MONO ABS: 0.5 10*3/uL (ref 0.1–1.0)
Monocytes Relative: 7 %
Neutro Abs: 5.1 10*3/uL (ref 1.7–7.7)
Neutrophils Relative %: 66 %
PLATELETS: 222 10*3/uL (ref 150–400)
RBC: 4.21 MIL/uL (ref 3.87–5.11)
RDW: 12.7 % (ref 11.5–15.5)
WBC: 7.7 10*3/uL (ref 4.0–10.5)

## 2015-09-17 NOTE — ED Provider Notes (Signed)
TIME SEEN: 2:05 AM  CHIEF COMPLAINT: Vaginal bleeding  HPI: Pt is a 45 y.o. female with history of pulmonary embolism and DVT after previous surgery, anxiety who presents to the emergency department with complaints of vaginal bleeding. Patient had a laparoscopic vaginal hysterectomy on August 31 with Dr. Odella Aquas because she states she had endometrial thickening. Also had her right ovary removed because she states there is a mass on it. States after surgery she was doing well and only having a small amount of spotting when she would use the bathroom and white. States that last night she started having increasing bleeding and passed 1 large clot that she describes was the size of a $0.50 piece. States she was told that she passed any clots larger than a dime she needed to come to the emergency department. No increased pain, fevers, vaginal discharge, vomiting or diarrhea. Has an appointment with her OB/GYN tomorrow. Became very concerned about increased bleeding because she is on Lovenox prophylactically since her surgery. States she is only had to use one pad today but did not soak through it.  ROS: See HPI Constitutional: no fever  Eyes: no drainage  ENT: no runny nose   Cardiovascular:  no chest pain  Resp: no SOB  GI: no vomiting GU: no dysuria Integumentary: no rash  Allergy: no hives  Musculoskeletal: no leg swelling  Neurological: no slurred speech ROS otherwise negative  PAST MEDICAL HISTORY/PAST SURGICAL HISTORY:  Past Medical History:  Diagnosis Date  . Acute pulmonary embolism ([place]) 03/08/2013  . Chronic anxiety   . Depression   . Diastolic dysfunction 123456   Grade 2  . DUB (dysfunctional uterine bleeding) 11/2013  . DVT of upper extremity (deep vein thrombosis) ([place]) 02/2013   Associated with IV needle  . Microcytic anemia 03/09/2013   Ferritin 8   . Normal cardiac stress test 2009   normal coronary CT scan  . Parotid mass 02/2013    Benign; status post resection by  Dr. Constance Holster    MEDICATIONS:  Prior to Admission medications   Medication Sig Start Date End Date Taking? Authorizing Provider  buPROPion (WELLBUTRIN XL) 150 MG 24 hr tablet Take 1 tablet by mouth daily. 06/16/15   Historical Provider, MD  ibuprofen (ADVIL,MOTRIN) 200 MG tablet Take 200 mg by mouth every 6 (six) hours as needed.    Historical Provider, MD    ALLERGIES:  Allergies  Allergen Reactions  . Amoxicillin Swelling    SOCIAL HISTORY:  Social History  Substance Use Topics  . Smoking status: Never Smoker  . Smokeless tobacco: Never Used     Comment: Never smoked  . Alcohol use No    FAMILY HISTORY: Family History  Problem Relation Age of Onset  . Diabetes Mother   . Heart disease Father     EXAM: BP 129/80 (BP Location: Right Arm)   Pulse 99   Temp 98.1 F (36.7 C) (Oral)   Resp 18   Ht 5\' 3"  (1.6 m)   Wt 164 lb (74.4 kg)   LMP 01/22/2015   SpO2 97%   BMI 29.05 kg/m  CONSTITUTIONAL: Alert and oriented and responds appropriately to questions. Well-appearing; well-nourished, Anxious, tearful HEAD: Normocephalic EYES: Conjunctivae clear, PERRL ENT: normal nose; no rhinorrhea; moist mucous membranes NECK: Supple, no meningismus, no LAD  CARD: RRR; S1 and S2 appreciated; no murmurs, no clicks, no rubs, no gallops RESP: Normal chest excursion without splinting or tachypnea; breath sounds clear and equal bilaterally; no wheezes, no  rhonchi, no rales, no hypoxia or respiratory distress, speaking full sentences ABD/GI: Normal bowel sounds; non-distended; soft, non-tender, no rebound, no guarding, no peritoneal signs, laparoscopic incision noted to the umbilicus and suprapubic area that are healing without erythema, warmth, drainage or bleeding GU:  Normal external genitalia. No lesions, rashes noted. Patient has mild vaginal bleeding on exam without clots. No CVA vaginal discharge. She has a vaginal cuff seen on pelvic exam and sutures appear intact. No other lesions  noted. Chaperone present for exam. BACK:  The back appears normal and is non-tender to palpation, there is no CVA tenderness EXT: Normal ROM in all joints; non-tender to palpation; no edema; normal capillary refill; no cyanosis, no calf tenderness or swelling    SKIN: Normal color for age and race; warm; no rash NEURO: Moves all extremities equally, sensation to light touch intact diffusely, cranial nerves II through XII intact PSYCH: Patient is very anxious. Grooming and personal hygiene are appropriate.  MEDICAL DECISION MAKING: Patient here with very mild vaginal bleeding. She is very anxious, tearful and states she thinks that she is hemorrhaging. States she looked up her symptoms on the Internet and became concerned that her vaginal cuff may have opened in that she is bleeding internally. Her abdominal exam today is benign. Her vital signs are within normal limits. She only has mild bleeding on exam. Attempted to reassure patient and husband. We'll check labs and monitor patient in the ED. Anticipate discussing with her OB/GYN on call.  ED PROGRESS: 4:55 AM Hemoglobin is 12.5. She remained hemodynamically stable without any significant bleeding.  We'll discuss with her OB/GYN on call.  6:20 AM  Attempted to get in touch with her OB GYN on call x 3 without success.  Patient has been hemodynamically stable. Her hemoglobin is normal. She states that she has gone to the bathroom several times and is no longer seen any bleeding or clots. She states she needs to get her son to school at 7 AM and has an appointment with her OB/GYN at 10 AM. She feels comfortable with the plan to be discharged and follow-up with her OB/GYN as scheduled. I feel this is reasonable. I think the amount of bleeding she was having is normal for postop day 11 especially given she is on Lovenox. Given her bleeding is her stopped, her incision lites look fantastic, sutures of her vaginal cuff low-grade and there is no dehiscence of  the wound, no sign of infection, abdominal exam is benign, hemoglobin is stable and her vitals look good I think it is safe for her to go home and follow-up as an outpatient. Patient and husband are comfortable with this plan and appear to be very reliable people. Discussed at length return precautions, specifically bleeding return precautions.   At this time, I do not feel there is any life-threatening condition present. I have reviewed and discussed all results (EKG, imaging, lab, urine as appropriate), exam findings with patient/family. I have reviewed nursing notes and appropriate previous records.  I feel the patient is safe to be discharged home without further emergent workup and can continue workup as an outpatient as needed. Discussed usual and customary return precautions. Patient/family verbalize understanding and are comfortable with this plan.  Outpatient follow-up has been provided. All questions have been answered.    [place], DO 09/17/15 [phone]

## 2015-09-17 NOTE — ED Triage Notes (Signed)
Pt with vaginal bleeding since August 31. Pt states that she has went through one pad since 11am and then about 82mins ago pt states she went to bathroom and passed a clot and had blood in toilet. Pt very anxious and tearful during triage. Pt states she is on Lovenox shots 40mg  twice a day for hx of PE and DVT. Pt thinks she is hemorrhaging.

## 2015-09-18 LAB — GC/CHLAMYDIA PROBE AMP (~~LOC~~) NOT AT ARMC
CHLAMYDIA, DNA PROBE: NEGATIVE
Neisseria Gonorrhea: NEGATIVE

## 2015-09-25 DIAGNOSIS — N898 Other specified noninflammatory disorders of vagina: Secondary | ICD-10-CM | POA: Diagnosis not present

## 2015-10-29 DIAGNOSIS — Z23 Encounter for immunization: Secondary | ICD-10-CM | POA: Diagnosis not present

## 2015-11-01 DIAGNOSIS — J358 Other chronic diseases of tonsils and adenoids: Secondary | ICD-10-CM | POA: Diagnosis not present

## 2015-11-01 DIAGNOSIS — K219 Gastro-esophageal reflux disease without esophagitis: Secondary | ICD-10-CM | POA: Diagnosis not present

## 2015-12-11 ENCOUNTER — Ambulatory Visit (HOSPITAL_COMMUNITY)
Admission: RE | Admit: 2015-12-11 | Discharge: 2015-12-11 | Disposition: A | Discharge status: Home / Self Care | Payer: BLUE CROSS/BLUE SHIELD | Source: Ambulatory Visit | Attending: Family Medicine | Admitting: Family Medicine

## 2015-12-11 ENCOUNTER — Other Ambulatory Visit (HOSPITAL_COMMUNITY): Payer: Self-pay | Admitting: Family Medicine

## 2015-12-11 DIAGNOSIS — R079 Chest pain, unspecified: Secondary | ICD-10-CM | POA: Diagnosis not present

## 2015-12-11 DIAGNOSIS — I2699 Other pulmonary embolism without acute cor pulmonale: Secondary | ICD-10-CM | POA: Diagnosis not present

## 2015-12-11 DIAGNOSIS — R0789 Other chest pain: Secondary | ICD-10-CM | POA: Diagnosis not present

## 2015-12-11 DIAGNOSIS — M940 Chondrocostal junction syndrome [Tietze]: Secondary | ICD-10-CM | POA: Diagnosis not present

## 2015-12-11 DIAGNOSIS — Z6829 Body mass index (BMI) 29.0-29.9, adult: Secondary | ICD-10-CM | POA: Diagnosis not present

## 2015-12-11 DIAGNOSIS — Z1389 Encounter for screening for other disorder: Secondary | ICD-10-CM | POA: Diagnosis not present

## 2015-12-11 MED ORDER — IOPAMIDOL (ISOVUE-370) INJECTION 76%
100.0000 mL | Freq: Once | INTRAVENOUS | Status: AC | PRN
  Administered 2015-12-11: 100 mL via INTRAVENOUS

## 2016-03-02 ENCOUNTER — Inpatient Hospital Stay (HOSPITAL_COMMUNITY)
Admission: EM | Admit: 2016-03-02 | Discharge: 2016-03-04 | DRG: 419 | Disposition: A | Discharge status: Home / Self Care | Payer: BLUE CROSS/BLUE SHIELD | Attending: General Surgery | Admitting: General Surgery

## 2016-03-02 ENCOUNTER — Emergency Department (HOSPITAL_COMMUNITY): Payer: BLUE CROSS/BLUE SHIELD

## 2016-03-02 ENCOUNTER — Encounter (HOSPITAL_COMMUNITY): Payer: Self-pay | Admitting: Emergency Medicine

## 2016-03-02 DIAGNOSIS — Z8249 Family history of ischemic heart disease and other diseases of the circulatory system: Secondary | ICD-10-CM

## 2016-03-02 DIAGNOSIS — R740 Nonspecific elevation of levels of transaminase and lactic acid dehydrogenase [LDH]: Secondary | ICD-10-CM | POA: Diagnosis not present

## 2016-03-02 DIAGNOSIS — K819 Cholecystitis, unspecified: Secondary | ICD-10-CM

## 2016-03-02 DIAGNOSIS — Z90710 Acquired absence of both cervix and uterus: Secondary | ICD-10-CM

## 2016-03-02 DIAGNOSIS — K219 Gastro-esophageal reflux disease without esophagitis: Secondary | ICD-10-CM | POA: Diagnosis present

## 2016-03-02 DIAGNOSIS — F418 Other specified anxiety disorders: Secondary | ICD-10-CM | POA: Diagnosis present

## 2016-03-02 DIAGNOSIS — R1013 Epigastric pain: Secondary | ICD-10-CM | POA: Diagnosis not present

## 2016-03-02 DIAGNOSIS — Z86711 Personal history of pulmonary embolism: Secondary | ICD-10-CM

## 2016-03-02 DIAGNOSIS — Z01818 Encounter for other preprocedural examination: Secondary | ICD-10-CM | POA: Diagnosis not present

## 2016-03-02 DIAGNOSIS — Z833 Family history of diabetes mellitus: Secondary | ICD-10-CM

## 2016-03-02 DIAGNOSIS — K810 Acute cholecystitis: Secondary | ICD-10-CM

## 2016-03-02 DIAGNOSIS — Z86718 Personal history of other venous thrombosis and embolism: Secondary | ICD-10-CM | POA: Diagnosis not present

## 2016-03-02 DIAGNOSIS — Z87442 Personal history of urinary calculi: Secondary | ICD-10-CM

## 2016-03-02 DIAGNOSIS — R109 Unspecified abdominal pain: Secondary | ICD-10-CM | POA: Diagnosis not present

## 2016-03-02 DIAGNOSIS — K8012 Calculus of gallbladder with acute and chronic cholecystitis without obstruction: Principal | ICD-10-CM | POA: Diagnosis present

## 2016-03-02 DIAGNOSIS — K8020 Calculus of gallbladder without cholecystitis without obstruction: Secondary | ICD-10-CM | POA: Diagnosis not present

## 2016-03-02 DIAGNOSIS — R7401 Elevation of levels of liver transaminase levels: Secondary | ICD-10-CM

## 2016-03-02 DIAGNOSIS — R1031 Right lower quadrant pain: Secondary | ICD-10-CM | POA: Diagnosis not present

## 2016-03-02 LAB — COMPREHENSIVE METABOLIC PANEL
ALBUMIN: 4.1 g/dL (ref 3.5–5.0)
ALT: 222 U/L — AB (ref 14–54)
AST: 367 U/L — AB (ref 15–41)
Alkaline Phosphatase: 138 U/L — ABNORMAL HIGH (ref 38–126)
Anion gap: 9 (ref 5–15)
BILIRUBIN TOTAL: 1 mg/dL (ref 0.3–1.2)
BUN: 12 mg/dL (ref 6–20)
CHLORIDE: 105 mmol/L (ref 101–111)
CO2: 25 mmol/L (ref 22–32)
Calcium: 9.7 mg/dL (ref 8.9–10.3)
Creatinine, Ser: 0.9 mg/dL (ref 0.44–1.00)
GFR calc Af Amer: >60 mL/min (ref >60)
GFR calc non Af Amer: >60 mL/min (ref >60)
GLUCOSE: 127 mg/dL — AB (ref 65–99)
Potassium: 3.8 mmol/L (ref 3.5–5.1)
Sodium: 139 mmol/L (ref 135–145)
Total Protein: 7.3 g/dL (ref 6.5–8.1)

## 2016-03-02 LAB — URINALYSIS, ROUTINE W REFLEX MICROSCOPIC
Bilirubin Urine: NEGATIVE
Glucose, UA: NEGATIVE mg/dL
Ketones, ur: NEGATIVE mg/dL
Leukocytes, UA: NEGATIVE
Nitrite: NEGATIVE
PH: 5 (ref 5.0–8.0)
Protein, ur: NEGATIVE mg/dL
SPECIFIC GRAVITY, URINE: 1.023 (ref 1.005–1.030)

## 2016-03-02 LAB — D-DIMER, QUANTITATIVE: D-Dimer, Quant: 0.99 ug/mL-FEU — ABNORMAL HIGH (ref 0.00–0.50)

## 2016-03-02 LAB — LIPASE, BLOOD: Lipase: 28 U/L (ref 11–51)

## 2016-03-02 LAB — CBC WITH DIFFERENTIAL/PLATELET
BASOS ABS: 0 10*3/uL (ref 0.0–0.1)
BASOS PCT: 0 %
Eosinophils Absolute: 0 10*3/uL (ref 0.0–0.7)
Eosinophils Relative: 0 %
HEMATOCRIT: 41.8 % (ref 36.0–46.0)
Hemoglobin: 14.2 g/dL (ref 12.0–15.0)
Lymphocytes Relative: 12 %
Lymphs Abs: 1.3 10*3/uL (ref 0.7–4.0)
MCH: 30 pg (ref 26.0–34.0)
MCHC: 34 g/dL (ref 30.0–36.0)
MCV: 88.2 fL (ref 78.0–100.0)
MONO ABS: 1.1 10*3/uL — AB (ref 0.1–1.0)
Monocytes Relative: 10 %
NEUTROS ABS: 8.4 10*3/uL — AB (ref 1.7–7.7)
Neutrophils Relative %: 78 %
PLATELETS: 187 10*3/uL (ref 150–400)
RBC: 4.74 MIL/uL (ref 3.87–5.11)
RDW: 13 % (ref 11.5–15.5)
WBC: 10.8 10*3/uL — AB (ref 4.0–10.5)

## 2016-03-02 LAB — PREGNANCY, URINE: PREG TEST UR: NEGATIVE

## 2016-03-02 LAB — ACETAMINOPHEN LEVEL: Acetaminophen (Tylenol), Serum: <10 ug/mL — ABNORMAL LOW (ref 10–30)

## 2016-03-02 MED ORDER — PANTOPRAZOLE SODIUM 40 MG PO TBEC
40.0000 mg | DELAYED_RELEASE_TABLET | Freq: Every day | ORAL | Status: DC
  Administered 2016-03-02: 40 mg via ORAL
  Filled 2016-03-02: qty 1

## 2016-03-02 MED ORDER — LORAZEPAM 2 MG/ML IJ SOLN: 1.0000 mg | INTRAMUSCULAR | Status: DC | PRN

## 2016-03-02 MED ORDER — ONDANSETRON 4 MG PO TBDP: 4.0000 mg | ORAL_TABLET | Freq: Four times a day (QID) | ORAL | Status: DC | PRN

## 2016-03-02 MED ORDER — SODIUM CHLORIDE 0.9 % IV BOLUS (SEPSIS)
1000.0000 mL | Freq: Once | INTRAVENOUS | Status: AC
  Administered 2016-03-02: 1000 mL via INTRAVENOUS

## 2016-03-02 MED ORDER — KCL IN DEXTROSE-NACL 20-5-0.45 MEQ/L-%-% IV SOLN
INTRAVENOUS | Status: DC
  Administered 2016-03-02 – 2016-03-03 (×2): via INTRAVENOUS

## 2016-03-02 MED ORDER — ENOXAPARIN SODIUM 40 MG/0.4ML ~~LOC~~ SOLN
40.0000 mg | SUBCUTANEOUS | Status: DC
  Administered 2016-03-02: 40 mg via SUBCUTANEOUS
  Filled 2016-03-02: qty 0.4

## 2016-03-02 MED ORDER — IOPAMIDOL (ISOVUE-370) INJECTION 76%
100.0000 mL | Freq: Once | INTRAVENOUS | Status: AC | PRN
  Administered 2016-03-02: 100 mL via INTRAVENOUS

## 2016-03-02 MED ORDER — ONDANSETRON HCL 4 MG/2ML IJ SOLN
4.0000 mg | Freq: Once | INTRAMUSCULAR | Status: AC
  Administered 2016-03-02: 4 mg via INTRAVENOUS
  Filled 2016-03-02: qty 2

## 2016-03-02 MED ORDER — CIPROFLOXACIN IN D5W 400 MG/200ML IV SOLN
400.0000 mg | Freq: Once | INTRAVENOUS | Status: AC
  Administered 2016-03-02: 400 mg via INTRAVENOUS
  Filled 2016-03-02: qty 200

## 2016-03-02 MED ORDER — MORPHINE SULFATE (PF) 4 MG/ML IV SOLN
4.0000 mg | Freq: Once | INTRAVENOUS | Status: AC
Start: 2016-03-02 — End: 2016-03-02
  Administered 2016-03-02: 4 mg via INTRAVENOUS
  Filled 2016-03-02: qty 1

## 2016-03-02 MED ORDER — ONDANSETRON HCL 4 MG/2ML IJ SOLN
4.0000 mg | Freq: Four times a day (QID) | INTRAMUSCULAR | Status: DC | PRN
  Administered 2016-03-03: 4 mg via INTRAVENOUS

## 2016-03-02 MED ORDER — CIPROFLOXACIN IN D5W 400 MG/200ML IV SOLN
400.0000 mg | Freq: Two times a day (BID) | INTRAVENOUS | Status: DC
  Administered 2016-03-02 – 2016-03-03 (×2): 400 mg via INTRAVENOUS
  Filled 2016-03-02 (×2): qty 200

## 2016-03-02 MED ORDER — BUPROPION HCL ER (XL) 150 MG PO TB24
150.0000 mg | ORAL_TABLET | Freq: Every day | ORAL | Status: DC
  Administered 2016-03-02: 150 mg via ORAL
  Filled 2016-03-02 (×2): qty 1

## 2016-03-02 MED ORDER — OXYCODONE-ACETAMINOPHEN 5-325 MG PO TABS: 1.0000 | ORAL_TABLET | ORAL | Status: DC | PRN

## 2016-03-02 MED ORDER — MORPHINE SULFATE (PF) 4 MG/ML IV SOLN
4.0000 mg | Freq: Once | INTRAVENOUS | Status: AC
  Administered 2016-03-02: 4 mg via INTRAVENOUS
  Filled 2016-03-02: qty 1

## 2016-03-02 MED ORDER — IBUPROFEN 600 MG PO TABS
600.0000 mg | ORAL_TABLET | Freq: Four times a day (QID) | ORAL | Status: DC | PRN
  Administered 2016-03-02: 600 mg via ORAL
  Filled 2016-03-02: qty 1

## 2016-03-02 NOTE — ED Triage Notes (Signed)
Patient has had lower back pain, right flank pain x 3 weeks, thought it was contributed to stopping Prilosec, and starting yesterday having dark urination with burning.

## 2016-03-02 NOTE — ED Provider Notes (Signed)
[facility] DEPT Provider Note   CSN: [id number] Arrival date & time: 03/02/16  0355     History   Chief Complaint Chief Complaint  Patient presents with  . Back Pain    HPI Victoria Stein is a 46 y.o. female.  Patient presents with severe right flank pain that woke her from sleep around midnight. This pain is constant but waxes and wanes in severity. States she's had intermittent "kidney pain" for the past several weeks she thought was due to Prilosec which she stopped taking about 3 weeks ago. Yesterday she developed epigastric pain that was constant for most of the afternoon associated with nausea. No vomiting. She thought she was getting the "stomach bug". She took a Tums and some Tylenol. Flank pain woke her from sleep and has been associated with some dark urine and burning with urination since yesterday. She's had a kidney stone in the past and this feels similar. Patient also with history of DVT and PE no longer on anticoagulation. Denies any chest pain or shortness of breath.   The history is provided by the patient.  Back Pain   Associated symptoms include abdominal pain. Pertinent negatives include no fever, no headaches, no dysuria and no weakness.    Past Medical History:  Diagnosis Date  . Acute pulmonary embolism ([place]) 03/08/2013  . Chronic anxiety   . Depression   . Diastolic dysfunction [id number]   Grade 2  . DUB (dysfunctional uterine bleeding) 11/2013  . DVT of upper extremity (deep vein thrombosis) ([place]) 02/2013   Associated with IV needle  . Microcytic anemia 03/09/2013   Ferritin 8   . Normal cardiac stress test 2009   normal coronary CT scan  . Parotid mass 02/2013    Benign; status post resection by Dr. Constance Holster    Patient Active Problem List   Diagnosis Date Noted  . Abnormal LFTs 06/27/2013  . GERD (gastroesophageal reflux disease) 06/23/2013  . Diastolic dysfunction [id number]  . Iron deficiency anemia due to chronic blood loss 03/09/2013  . Acute  pulmonary embolism ([place]) 03/08/2013  . DVT of upper extremity (deep vein thrombosis) ([place]) 03/08/2013  . Acute anxiety 03/08/2013    Past Surgical History:  Procedure Laterality Date  . ABDOMINAL HYSTERECTOMY    . ABLATION  12/2013  . PAROTID GLAND TUMOR EXCISION    . WISDOM TOOTH EXTRACTION      OB History    No data available       Home Medications    Prior to Admission medications   Medication Sig Start Date End Date Taking? Authorizing Provider  buPROPion (WELLBUTRIN XL) 150 MG 24 hr tablet Take 1 tablet by mouth daily. 06/16/15  Yes Historical Provider, MD  ibuprofen (ADVIL,MOTRIN) 200 MG tablet Take 200 mg by mouth every 6 (six) hours as needed.   Yes Historical Provider, MD    Family History Family History  Problem Relation Age of Onset  . Diabetes Mother   . Heart disease Father     Social History Social History  Substance Use Topics  . Smoking status: Never Smoker  . Smokeless tobacco: Never Used     Comment: Never smoked  . Alcohol use No     Allergies   Amoxicillin   Review of Systems Review of Systems  Constitutional: Positive for activity change and appetite change. Negative for fever.  HENT: Negative for congestion and rhinorrhea.   Eyes: Negative for visual disturbance.  Respiratory: Negative for cough, chest tightness and shortness of  breath.   Gastrointestinal: Positive for abdominal pain and nausea. Negative for constipation, diarrhea and vomiting.  Genitourinary: Negative for dysuria, hematuria, vaginal bleeding and vaginal discharge.  Musculoskeletal: Positive for back pain. Negative for neck pain and neck stiffness.  Neurological: Negative for dizziness, weakness and headaches.   A complete 10 system review of systems was obtained and all systems are negative except as noted in the HPI and PMH.    Physical Exam Updated Vital Signs BP 115/58 (BP Location: Left Arm)   Pulse 95   Temp 98.2 F (36.8 C) (Oral)   Resp 18   Ht 5\' 4"   (1.626 m)   Wt 161 lb (73 kg)   LMP 01/22/2015   SpO2 98%   BMI 27.64 kg/m   Physical Exam  Constitutional: She is oriented to person, place, and time. She appears well-developed and well-nourished. No distress.  HENT:  Head: Normocephalic and atraumatic.  Mouth/Throat: Oropharynx is clear and moist. No oropharyngeal exudate.  Eyes: Conjunctivae and EOM are normal. Pupils are equal, round, and reactive to light.  Neck: Normal range of motion. Neck supple.  No meningismus.  Cardiovascular: Normal rate, regular rhythm, normal heart sounds and intact distal pulses.   No murmur heard. Pulmonary/Chest: Effort normal and breath sounds normal. No respiratory distress.  Abdominal: Soft. There is tenderness. There is no rebound and no guarding.  Epigastric tenderness. No RUQ tenderness  Musculoskeletal: Normal range of motion. She exhibits tenderness. She exhibits no edema.  R CVAT, paraspinal lumbar tenderness. No midline tenderness  Neurological: She is alert and oriented to person, place, and time. No cranial nerve deficit. She exhibits normal muscle tone. Coordination normal.  No ataxia on finger to nose bilaterally. No pronator drift. 5/5 strength throughout. CN 2-12 intact.Equal grip strength. Sensation intact.   Skin: Skin is warm. Capillary refill takes less than 2 seconds.  Psychiatric: She has a normal mood and affect. Her behavior is normal.  Nursing note and vitals reviewed.    ED Treatments / Results  Labs (all labs ordered are listed, but only abnormal results are displayed) Labs Reviewed  URINALYSIS, ROUTINE W REFLEX MICROSCOPIC - Abnormal; Notable for the following:       Result Value   Color, Urine AMBER (*)    APPearance HAZY (*)    Hgb urine dipstick SMALL (*)    Bacteria, UA RARE (*)    All other components within normal limits  CBC WITH DIFFERENTIAL/PLATELET - Abnormal; Notable for the following:    WBC 10.8 (*)    Neutro Abs 8.4 (*)    Monocytes Absolute 1.1  (*)    All other components within normal limits  COMPREHENSIVE METABOLIC PANEL - Abnormal; Notable for the following:    Glucose, Bld 127 (*)    AST 367 (*)    ALT 222 (*)    Alkaline Phosphatase 138 (*)    All other components within normal limits  D-DIMER, QUANTITATIVE (NOT AT [facility]) - Abnormal; Notable for the following:    D-Dimer, Quant 0.99 (*)    All other components within normal limits  ACETAMINOPHEN LEVEL - Abnormal; Notable for the following:    Acetaminophen (Tylenol), Serum <10 (*)    All other components within normal limits  PREGNANCY, URINE  LIPASE, BLOOD    EKG  EKG Interpretation None       Radiology Ct Angio Chest Pe W And/or Wo Contrast  Result Date: 03/02/2016 CLINICAL DATA:  Low back and right flank pain for 3  weeks. New onset dark urine and burning with urination yesterday. Personal history of pulmonary embolus. EXAM: CT ANGIOGRAPHY CHEST WITH CONTRAST TECHNIQUE: Multidetector CT imaging of the chest was performed using the standard protocol during bolus administration of intravenous contrast. Multiplanar CT image reconstructions and MIPs were obtained to evaluate the vascular anatomy. CONTRAST:  100 mL Isovue 370 COMPARISON:  CTA chest 12/11/2015. FINDINGS: Cardiovascular: The heart size normal. Pulmonary arterial opacification is excellent. No focal filling defects are evident to suggest pulmonary emboli. The aortic arch and great vessels are within normal limits. Mediastinum/Nodes: No significant mediastinal or axillary adenopathy is present. The esophagus is within normal limits. Lungs/Pleura: Minimal dependent atelectasis is present bilaterally. Focal nodule, mass, or airspace disease is present. There is no significant pericardial effusion. Upper Abdomen: Within normal limits. Musculoskeletal: Vertebral body heights and alignment are normal. No focal lytic or blastic lesions are present. Review of the MIP images confirms the above findings. IMPRESSION: 1. No  evidence of pulmonary embolus. 2. Negative CTA of the chest. Electronically Signed   By: San Morelle M.D.   On: 03/02/2016 06:57   Ct Renal Stone Study  Result Date: 03/02/2016 CLINICAL DATA:  Low back and right flank pain for 3 weeks. New onset of dark urination and burning with urination yesterday. Personal history of pulmonary embolus. EXAM: CT ABDOMEN AND PELVIS WITHOUT CONTRAST TECHNIQUE: Multidetector CT imaging of the abdomen and pelvis was performed following the standard protocol without IV contrast. COMPARISON:  None. FINDINGS: Lower chest: Mild dependent atelectasis is present bilaterally. Hepatobiliary: No focal hepatic lesions are present. Liver contour is smooth. The common bile duct is within normal limits. There is wall thickening and slight inflammatory changes surrounding the gallbladder. No definite stones are evident. Pancreas: No focal inflammatory changes are present. There is no solid or cystic mass lesion. Pancreatic duct is within normal limits. Spleen: Normal Adrenals/Urinary Tract: The adrenal glands are normal bilaterally. The kidneys and ureters are unremarkable. There is no stone or mass lesion. No obstructive uropathy is present. The urinary bladder is not distended. Wall thickening is likely within normal limits. Stomach/Bowel: The stomach and duodenum are within normal limits. The small bowel is unremarkable. The appendix is visualized and normal. The ascending and transverse colon are within normal limits. The descending and sigmoid colon are unremarkable. Vascular/Lymphatic: No significant vascular calcifications are present. There is no significant adenopathy Reproductive: Patient is status post hysterectomy. Adnexa are within normal limits for age. Musculoskeletal: No focal lytic or blastic lesions are present. The bony pelvis is intact. Vertebral body heights and alignment are maintained. IMPRESSION: 1. Wall thickening and mild inflammatory changes about the  gallbladder. This may be secondary to under distention. Cholecystitis is considered. Limited right upper quadrant ultrasound would be useful further evaluation. 2. No nephrolithiasis or obstructive uropathy. 3. Mild diffuse bladder wall thickening is likely due to under distention. This could be related to urinary tract infection as well, given the urinalysis results. Electronically Signed   By: San Morelle M.D.   On: 03/02/2016 07:05    Procedures Procedures (including critical care time)  Medications Ordered in ED Medications  sodium chloride 0.9 % bolus 1,000 mL (not administered)  ondansetron (ZOFRAN) injection 4 mg (not administered)  morphine 4 MG/ML injection 4 mg (not administered)     Initial Impression / Assessment and Plan / ED Course  I have reviewed the triage vital signs and the nursing notes.  Pertinent labs & imaging results that were available during my care of  the patient were reviewed by me and considered in my medical decision making (see chart for details).     R flank pain, burning with urination. Epigastric and R flank pain. No vomiting or diarrhea.  UA with small amount of blood.  Pain resolved after morphine. LFTs with mild transaminitis.  Lipase normal.   Imaging concerning for cholecystitis. Patient with no kidney stone, no pulmonary embolism.  No vomiting or diarrhea in the ED. Discussed with Dr. Arnoldo Morale who will evaluate patient in the ED. Antibiotics given. Vitals remained stable.  Final Clinical Impressions(s) / ED Diagnoses   Final diagnoses:  Flank pain  Acute cholecystitis  Transaminitis    New Prescriptions New Prescriptions   No medications on file     Ezequiel Essex, MD 03/02/16 [phone]

## 2016-03-02 NOTE — ED Notes (Signed)
Pt stable and ready for transport to [facility].  Report given to Karolee Ohs, RN.

## 2016-03-02 NOTE — H&P (Signed)
Victoria Stein is an 46 y.o. female.   Chief Complaint: Right flank pain HPI: Patient is a 46 year old white female who has had a three-week history of back pain. Over the past several days, she began experiencing epigastric pain which went across her upper abdomen. She also had significant nausea and vomiting. She denied fever, chills, or jaundice. She thought she had a kidney stone. She presented [facility] and was found on CT scan the abdomen to have evidence of cholecystitis. No cholelithiasis was seen. No kidney stone was seen. She also has a history the past of a PE, but CT scan of chest was negative. She continues to have upper abdominal pain and nausea despite medications. She is being admitted to the hospital for further evaluation and treatment. She denies any fatty food intolerance. Her pain is 8 out of 10.  Past Medical History:  Diagnosis Date  . Acute pulmonary embolism ([place]) 03/08/2013  . Chronic anxiety   . Depression   . Diastolic dysfunction 4/0/9811   Grade 2  . DUB (dysfunctional uterine bleeding) 11/2013  . DVT of upper extremity (deep vein thrombosis) ([place]) 02/2013   Associated with IV needle  . Microcytic anemia 03/09/2013   Ferritin 8   . Normal cardiac stress test 2009   normal coronary CT scan  . Parotid mass 02/2013    Benign; status post resection by Dr. Constance Holster    Past Surgical History:  Procedure Laterality Date  . ABDOMINAL HYSTERECTOMY    . ABLATION  12/2013  . PAROTID GLAND TUMOR EXCISION    . WISDOM TOOTH EXTRACTION      Family History  Problem Relation Age of Onset  . Diabetes Mother   . Heart disease Father    Social History:  reports that she has never smoked. She has never used smokeless tobacco. She reports that she does not drink alcohol or use drugs.  Allergies:  Allergies  Allergen Reactions  . Amoxicillin Swelling     (Not in a hospital admission)  Results for orders placed or performed during the hospital encounter of 03/02/16  (from the past 48 hour(s))  Urinalysis, Routine w reflex microscopic     Status: Abnormal   Collection Time: 03/02/16  4:06 AM  Result Value Ref Range   Color, Urine AMBER (A) YELLOW    Comment: BIOCHEMICALS MAY BE AFFECTED BY COLOR   APPearance HAZY (A) CLEAR   Specific Gravity, Urine 1.023 1.005 - 1.030   pH 5.0 5.0 - 8.0   Glucose, UA NEGATIVE NEGATIVE mg/dL   Hgb urine dipstick SMALL (A) NEGATIVE   Bilirubin Urine NEGATIVE NEGATIVE   Ketones, ur NEGATIVE NEGATIVE mg/dL   Protein, ur NEGATIVE NEGATIVE mg/dL   Nitrite NEGATIVE NEGATIVE   Leukocytes, UA NEGATIVE NEGATIVE   RBC / HPF 0-5 0 - 5 RBC/hpf   WBC, UA 6-30 0 - 5 WBC/hpf   Bacteria, UA RARE (A) NONE SEEN   Mucous PRESENT   Pregnancy, urine     Status: None   Collection Time: 03/02/16  4:06 AM  Result Value Ref Range   Preg Test, Ur NEGATIVE NEGATIVE  Acetaminophen level     Status: Abnormal   Collection Time: 03/02/16  4:06 AM  Result Value Ref Range   Acetaminophen (Tylenol), Serum <10 (L) 10 - 30 ug/mL    Comment:        THERAPEUTIC CONCENTRATIONS VARY SIGNIFICANTLY. A RANGE OF 10-30 ug/mL MAY BE AN EFFECTIVE CONCENTRATION FOR MANY PATIENTS. HOWEVER, SOME  ARE BEST TREATED AT CONCENTRATIONS OUTSIDE THIS RANGE. ACETAMINOPHEN CONCENTRATIONS >150 ug/mL AT 4 HOURS AFTER INGESTION AND >50 ug/mL AT 12 HOURS AFTER INGESTION ARE OFTEN ASSOCIATED WITH TOXIC REACTIONS.   CBC with Differential/Platelet     Status: Abnormal   Collection Time: 03/02/16  4:55 AM  Result Value Ref Range   WBC 10.8 (H) 4.0 - 10.5 K/uL   RBC 4.74 3.87 - 5.11 MIL/uL   Hemoglobin 14.2 12.0 - 15.0 g/dL   HCT 41.8 36.0 - 46.0 %   MCV 88.2 78.0 - 100.0 fL   MCH 30.0 26.0 - 34.0 pg   MCHC 34.0 30.0 - 36.0 g/dL   RDW 13.0 11.5 - 15.5 %   Platelets 187 150 - 400 K/uL   Neutrophils Relative % 78 %   Neutro Abs 8.4 (H) 1.7 - 7.7 K/uL   Lymphocytes Relative 12 %   Lymphs Abs 1.3 0.7 - 4.0 K/uL   Monocytes Relative 10 %   Monocytes Absolute  1.1 (H) 0.1 - 1.0 K/uL   Eosinophils Relative 0 %   Eosinophils Absolute 0.0 0.0 - 0.7 K/uL   Basophils Relative 0 %   Basophils Absolute 0.0 0.0 - 0.1 K/uL  Comprehensive metabolic panel     Status: Abnormal   Collection Time: 03/02/16  4:55 AM  Result Value Ref Range   Sodium 139 135 - 145 mmol/L   Potassium 3.8 3.5 - 5.1 mmol/L   Chloride 105 101 - 111 mmol/L   CO2 25 22 - 32 mmol/L   Glucose, Bld 127 (H) 65 - 99 mg/dL   BUN 12 6 - 20 mg/dL   Creatinine, Ser 0.90 0.44 - 1.00 mg/dL   Calcium 9.7 8.9 - 10.3 mg/dL   Total Protein 7.3 6.5 - 8.1 g/dL   Albumin 4.1 3.5 - 5.0 g/dL   AST 367 (H) 15 - 41 U/L   ALT 222 (H) 14 - 54 U/L   Alkaline Phosphatase 138 (H) 38 - 126 U/L   Total Bilirubin 1.0 0.3 - 1.2 mg/dL   GFR calc non Af Amer >60 >60 mL/min   GFR calc Af Amer >60 >60 mL/min    Comment: (NOTE) The eGFR has been calculated using the CKD EPI equation. This calculation has not been validated in all clinical situations. eGFR's persistently <60 mL/min signify possible Chronic Kidney Disease.    Anion gap 9 5 - 15  Lipase, blood     Status: None   Collection Time: 03/02/16  4:55 AM  Result Value Ref Range   Lipase 28 11 - 51 U/L  D-dimer, quantitative (not at [facility])     Status: Abnormal   Collection Time: 03/02/16  4:56 AM  Result Value Ref Range   D-Dimer, Quant 0.99 (H) 0.00 - 0.50 ug/mL-FEU    Comment: (NOTE) At the manufacturer cut-off of 0.50 ug/mL FEU, this assay has been documented to exclude PE with a sensitivity and negative predictive value of 97 to 99%.  At this time, this assay has not been approved by the FDA to exclude DVT/VTE. Results should be correlated with clinical presentation.    Ct Angio Chest Pe W And/or Wo Contrast  Result Date: 03/02/2016 CLINICAL DATA:  Low back and right flank pain for 3 weeks. New onset dark urine and burning with urination yesterday. Personal history of pulmonary embolus. EXAM: CT ANGIOGRAPHY CHEST WITH CONTRAST TECHNIQUE:  Multidetector CT imaging of the chest was performed using the standard protocol during bolus administration of intravenous contrast.  Multiplanar CT image reconstructions and MIPs were obtained to evaluate the vascular anatomy. CONTRAST:  100 mL Isovue 370 COMPARISON:  CTA chest 12/11/2015. FINDINGS: Cardiovascular: The heart size normal. Pulmonary arterial opacification is excellent. No focal filling defects are evident to suggest pulmonary emboli. The aortic arch and great vessels are within normal limits. Mediastinum/Nodes: No significant mediastinal or axillary adenopathy is present. The esophagus is within normal limits. Lungs/Pleura: Minimal dependent atelectasis is present bilaterally. Focal nodule, mass, or airspace disease is present. There is no significant pericardial effusion. Upper Abdomen: Within normal limits. Musculoskeletal: Vertebral body heights and alignment are normal. No focal lytic or blastic lesions are present. Review of the MIP images confirms the above findings. IMPRESSION: 1. No evidence of pulmonary embolus. 2. Negative CTA of the chest. Electronically Signed   By: San Morelle M.D.   On: 03/02/2016 06:57   Ct Renal Stone Study  Result Date: 03/02/2016 CLINICAL DATA:  Low back and right flank pain for 3 weeks. New onset of dark urination and burning with urination yesterday. Personal history of pulmonary embolus. EXAM: CT ABDOMEN AND PELVIS WITHOUT CONTRAST TECHNIQUE: Multidetector CT imaging of the abdomen and pelvis was performed following the standard protocol without IV contrast. COMPARISON:  None. FINDINGS: Lower chest: Mild dependent atelectasis is present bilaterally. Hepatobiliary: No focal hepatic lesions are present. Liver contour is smooth. The common bile duct is within normal limits. There is wall thickening and slight inflammatory changes surrounding the gallbladder. No definite stones are evident. Pancreas: No focal inflammatory changes are present. There is no  solid or cystic mass lesion. Pancreatic duct is within normal limits. Spleen: Normal Adrenals/Urinary Tract: The adrenal glands are normal bilaterally. The kidneys and ureters are unremarkable. There is no stone or mass lesion. No obstructive uropathy is present. The urinary bladder is not distended. Wall thickening is likely within normal limits. Stomach/Bowel: The stomach and duodenum are within normal limits. The small bowel is unremarkable. The appendix is visualized and normal. The ascending and transverse colon are within normal limits. The descending and sigmoid colon are unremarkable. Vascular/Lymphatic: No significant vascular calcifications are present. There is no significant adenopathy Reproductive: Patient is status post hysterectomy. Adnexa are within normal limits for age. Musculoskeletal: No focal lytic or blastic lesions are present. The bony pelvis is intact. Vertebral body heights and alignment are maintained. IMPRESSION: 1. Wall thickening and mild inflammatory changes about the gallbladder. This may be secondary to under distention. Cholecystitis is considered. Limited right upper quadrant ultrasound would be useful further evaluation. 2. No nephrolithiasis or obstructive uropathy. 3. Mild diffuse bladder wall thickening is likely due to under distention. This could be related to urinary tract infection as well, given the urinalysis results. Electronically Signed   By: San Morelle M.D.   On: 03/02/2016 07:05    Review of Systems  Constitutional: Positive for malaise/fatigue. Negative for weight loss.  HENT: Negative.   Eyes: Negative.   Respiratory: Negative.   Cardiovascular: Negative.   Gastrointestinal: Positive for abdominal pain, nausea and vomiting.  Genitourinary: Positive for flank pain.  Musculoskeletal: Positive for back pain.  Skin: Negative.   Neurological: Positive for weakness.  Endo/Heme/Allergies: Negative.     Blood pressure 115/71, pulse 82,  temperature 98.2 F (36.8 C), temperature source Oral, resp. rate 18, height 5' 4"  (1.626 m), weight 73 kg (161 lb), last menstrual period 01/22/2015, SpO2 98 %. Physical Exam  Nursing note and vitals reviewed. Constitutional: She is oriented to person, place, and time. She appears  well-developed and well-nourished.  HENT:  Head: Normocephalic and atraumatic.  Eyes: No scleral icterus.  Neck: Normal range of motion. Neck supple.  Cardiovascular: Normal rate, regular rhythm and normal heart sounds.   Respiratory: Effort normal and breath sounds normal.  GI: Soft. She exhibits no distension. There is tenderness. There is no rebound.  Tender in the right upper quadrant to deep palpation. No rigidity is noted.  Neurological: She is alert and oriented to person, place, and time.  Skin: Skin is warm and dry.     Assessment/Plan Impression:  Possible biliary colic secondary to cholecystitis. Liver enzyme test are elevated, though bilirubin is within normal limits. Ultrasound will be ordered to assess the hepatobiliary tree. Hepatitis panel has been ordered. The patient was brought into the hospital for control of her pain and nausea. Further management pending those results.   Jamesetta So, MD 03/02/2016, 9:25 AM

## 2016-03-03 ENCOUNTER — Inpatient Hospital Stay (HOSPITAL_COMMUNITY): Payer: BLUE CROSS/BLUE SHIELD

## 2016-03-03 ENCOUNTER — Encounter (HOSPITAL_COMMUNITY): Payer: Self-pay | Admitting: *Deleted

## 2016-03-03 ENCOUNTER — Inpatient Hospital Stay (HOSPITAL_COMMUNITY): Payer: BLUE CROSS/BLUE SHIELD | Admitting: Anesthesiology

## 2016-03-03 ENCOUNTER — Encounter (HOSPITAL_COMMUNITY)
Admission: EM | Disposition: A | Discharge status: Home / Self Care | Payer: Self-pay | Source: Home / Self Care | Attending: General Surgery

## 2016-03-03 HISTORY — PX: CHOLECYSTECTOMY: SHX55

## 2016-03-03 LAB — BASIC METABOLIC PANEL
ANION GAP: 6 (ref 5–15)
BUN: 7 mg/dL (ref 6–20)
CALCIUM: 8.6 mg/dL — AB (ref 8.9–10.3)
CO2: 28 mmol/L (ref 22–32)
CREATININE: 0.99 mg/dL (ref 0.44–1.00)
Chloride: 102 mmol/L (ref 101–111)
GLUCOSE: 101 mg/dL — AB (ref 65–99)
Potassium: 3.4 mmol/L — ABNORMAL LOW (ref 3.5–5.1)
Sodium: 136 mmol/L (ref 135–145)

## 2016-03-03 LAB — CBC
HCT: 42.7 % (ref 36.0–46.0)
HEMOGLOBIN: 13.9 g/dL (ref 12.0–15.0)
MCH: 29.6 pg (ref 26.0–34.0)
MCHC: 32.6 g/dL (ref 30.0–36.0)
MCV: 90.9 fL (ref 78.0–100.0)
PLATELETS: 180 10*3/uL (ref 150–400)
RBC: 4.7 MIL/uL (ref 3.87–5.11)
RDW: 13.5 % (ref 11.5–15.5)
WBC: 6.8 10*3/uL (ref 4.0–10.5)

## 2016-03-03 LAB — HEPATIC FUNCTION PANEL
ALBUMIN: 3.8 g/dL (ref 3.5–5.0)
ALT: 165 U/L — ABNORMAL HIGH (ref 14–54)
AST: 113 U/L — ABNORMAL HIGH (ref 15–41)
Alkaline Phosphatase: 130 U/L — ABNORMAL HIGH (ref 38–126)
BILIRUBIN INDIRECT: 0.5 mg/dL (ref 0.3–0.9)
Bilirubin, Direct: 0.1 mg/dL (ref 0.1–0.5)
TOTAL PROTEIN: 7.1 g/dL (ref 6.5–8.1)
Total Bilirubin: 0.6 mg/dL (ref 0.3–1.2)

## 2016-03-03 LAB — HEPATITIS PANEL, ACUTE
HEP A IGM: NEGATIVE
Hep B C IgM: NEGATIVE
Hepatitis B Surface Ag: NEGATIVE

## 2016-03-03 LAB — HIV ANTIBODY (ROUTINE TESTING W REFLEX): HIV Screen 4th Generation wRfx: NONREACTIVE

## 2016-03-03 LAB — SURGICAL PCR SCREEN
MRSA, PCR: NEGATIVE
Staphylococcus aureus: NEGATIVE

## 2016-03-03 SURGERY — LAPAROSCOPIC CHOLECYSTECTOMY
Anesthesia: General | Site: Abdomen

## 2016-03-03 MED ORDER — CHLORHEXIDINE GLUCONATE CLOTH 2 % EX PADS: 6.0000 | MEDICATED_PAD | Freq: Once | CUTANEOUS | Status: DC

## 2016-03-03 MED ORDER — ONDANSETRON HCL 4 MG/2ML IJ SOLN: 4.0000 mg | Freq: Four times a day (QID) | INTRAMUSCULAR | Status: DC | PRN

## 2016-03-03 MED ORDER — SODIUM CHLORIDE 0.9 % IR SOLN
Status: DC | PRN
  Administered 2016-03-03: 1000 mL

## 2016-03-03 MED ORDER — CIPROFLOXACIN IN D5W 400 MG/200ML IV SOLN
400.0000 mg | Freq: Two times a day (BID) | INTRAVENOUS | Status: DC
  Administered 2016-03-03 – 2016-03-04 (×2): 400 mg via INTRAVENOUS
  Filled 2016-03-03 (×2): qty 200

## 2016-03-03 MED ORDER — BUPIVACAINE HCL (PF) 0.5 % IJ SOLN
INTRAMUSCULAR | Status: AC
  Filled 2016-03-03: qty 30

## 2016-03-03 MED ORDER — ACETAMINOPHEN 325 MG PO TABS
650.0000 mg | ORAL_TABLET | Freq: Four times a day (QID) | ORAL | Status: DC | PRN
  Administered 2016-03-03 – 2016-03-04 (×2): 650 mg via ORAL
  Filled 2016-03-03 (×2): qty 2

## 2016-03-03 MED ORDER — SCOPOLAMINE 1 MG/3DAYS TD PT72
1.0000 | MEDICATED_PATCH | Freq: Once | TRANSDERMAL | Status: DC
  Administered 2016-03-03: 1.5 mg via TRANSDERMAL

## 2016-03-03 MED ORDER — MIDAZOLAM HCL 2 MG/2ML IJ SOLN
INTRAMUSCULAR | Status: AC
  Filled 2016-03-03: qty 2

## 2016-03-03 MED ORDER — GLYCOPYRROLATE 0.2 MG/ML IJ SOLN
INTRAMUSCULAR | Status: DC | PRN
  Administered 2016-03-03: .6 mg via INTRAVENOUS

## 2016-03-03 MED ORDER — PROPOFOL 10 MG/ML IV BOLUS
INTRAVENOUS | Status: DC | PRN
  Administered 2016-03-03: 150 mg via INTRAVENOUS

## 2016-03-03 MED ORDER — ENOXAPARIN SODIUM 40 MG/0.4ML ~~LOC~~ SOLN
40.0000 mg | SUBCUTANEOUS | Status: DC
  Administered 2016-03-04: 40 mg via SUBCUTANEOUS
  Filled 2016-03-03: qty 0.4

## 2016-03-03 MED ORDER — OXYCODONE-ACETAMINOPHEN 5-325 MG PO TABS
1.0000 | ORAL_TABLET | ORAL | Status: DC | PRN
  Administered 2016-03-04 (×2): 2 via ORAL
  Filled 2016-03-03 (×2): qty 2

## 2016-03-03 MED ORDER — CHLORHEXIDINE GLUCONATE CLOTH 2 % EX PADS
6.0000 | MEDICATED_PAD | Freq: Once | CUTANEOUS | Status: AC
  Administered 2016-03-03: 6 via TOPICAL

## 2016-03-03 MED ORDER — HYDROMORPHONE HCL 1 MG/ML IJ SOLN
0.2500 mg | INTRAMUSCULAR | Status: DC | PRN
  Administered 2016-03-03: 0.5 mg via INTRAVENOUS
  Filled 2016-03-03: qty 0.5

## 2016-03-03 MED ORDER — LIDOCAINE HCL (PF) 1 % IJ SOLN
INTRAMUSCULAR | Status: AC
  Filled 2016-03-03: qty 5

## 2016-03-03 MED ORDER — ACETAMINOPHEN 650 MG RE SUPP: 650.0000 mg | Freq: Four times a day (QID) | RECTAL | Status: DC | PRN

## 2016-03-03 MED ORDER — SCOPOLAMINE 1 MG/3DAYS TD PT72
MEDICATED_PATCH | TRANSDERMAL | Status: AC
  Filled 2016-03-03: qty 1

## 2016-03-03 MED ORDER — LACTATED RINGERS IV SOLN
INTRAVENOUS | Status: DC
  Administered 2016-03-03 (×2): via INTRAVENOUS

## 2016-03-03 MED ORDER — ONDANSETRON HCL 4 MG/2ML IJ SOLN
INTRAMUSCULAR | Status: AC
  Filled 2016-03-03: qty 2

## 2016-03-03 MED ORDER — KETOROLAC TROMETHAMINE 30 MG/ML IJ SOLN
30.0000 mg | Freq: Once | INTRAMUSCULAR | Status: AC
Start: 2016-03-03 — End: 2016-03-03
  Administered 2016-03-03: 30 mg via INTRAVENOUS

## 2016-03-03 MED ORDER — POVIDONE-IODINE 10 % EX OINT
TOPICAL_OINTMENT | CUTANEOUS | Status: AC
  Filled 2016-03-03: qty 1

## 2016-03-03 MED ORDER — ONDANSETRON 4 MG PO TBDP: 4.0000 mg | ORAL_TABLET | Freq: Four times a day (QID) | ORAL | Status: DC | PRN

## 2016-03-03 MED ORDER — ROCURONIUM BROMIDE 100 MG/10ML IV SOLN
INTRAVENOUS | Status: DC | PRN
  Administered 2016-03-03: 5 mg via INTRAVENOUS
  Administered 2016-03-03: 20 mg via INTRAVENOUS

## 2016-03-03 MED ORDER — DIPHENHYDRAMINE HCL 50 MG/ML IJ SOLN: 25.0000 mg | Freq: Four times a day (QID) | INTRAMUSCULAR | Status: DC | PRN

## 2016-03-03 MED ORDER — NEOSTIGMINE METHYLSULFATE 10 MG/10ML IV SOLN
INTRAVENOUS | Status: DC | PRN
  Administered 2016-03-03: 3.5 mg via INTRAVENOUS

## 2016-03-03 MED ORDER — FENTANYL CITRATE (PF) 100 MCG/2ML IJ SOLN
INTRAMUSCULAR | Status: DC | PRN
  Administered 2016-03-03 (×3): 50 ug via INTRAVENOUS

## 2016-03-03 MED ORDER — LIDOCAINE HCL (CARDIAC) 20 MG/ML IV SOLN
INTRAVENOUS | Status: DC | PRN
  Administered 2016-03-03: 40 mg via INTRAVENOUS

## 2016-03-03 MED ORDER — HYDROMORPHONE HCL 1 MG/ML IJ SOLN
1.0000 mg | INTRAMUSCULAR | Status: DC | PRN
  Filled 2016-03-03: qty 1

## 2016-03-03 MED ORDER — SIMETHICONE 80 MG PO CHEW: 40.0000 mg | CHEWABLE_TABLET | Freq: Four times a day (QID) | ORAL | Status: DC | PRN

## 2016-03-03 MED ORDER — HEMOSTATIC AGENTS (NO CHARGE) OPTIME
TOPICAL | Status: DC | PRN
  Administered 2016-03-03: 1 via TOPICAL

## 2016-03-03 MED ORDER — LORAZEPAM 2 MG/ML IJ SOLN: 1.0000 mg | INTRAMUSCULAR | Status: DC | PRN

## 2016-03-03 MED ORDER — POVIDONE-IODINE 10 % OINT PACKET
TOPICAL_OINTMENT | CUTANEOUS | Status: DC | PRN
  Administered 2016-03-03: 1 via TOPICAL

## 2016-03-03 MED ORDER — SUCCINYLCHOLINE CHLORIDE 20 MG/ML IJ SOLN
INTRAMUSCULAR | Status: DC | PRN
  Administered 2016-03-03: 100 mg via INTRAVENOUS

## 2016-03-03 MED ORDER — FENTANYL CITRATE (PF) 250 MCG/5ML IJ SOLN
INTRAMUSCULAR | Status: AC
  Filled 2016-03-03: qty 5

## 2016-03-03 MED ORDER — DEXAMETHASONE SODIUM PHOSPHATE 4 MG/ML IJ SOLN
INTRAMUSCULAR | Status: AC
  Filled 2016-03-03: qty 1

## 2016-03-03 MED ORDER — ONDANSETRON HCL 4 MG/2ML IJ SOLN
4.0000 mg | Freq: Once | INTRAMUSCULAR | Status: AC
  Administered 2016-03-03: 4 mg via INTRAVENOUS

## 2016-03-03 MED ORDER — BUPIVACAINE HCL (PF) 0.5 % IJ SOLN
INTRAMUSCULAR | Status: DC | PRN
  Administered 2016-03-03: 10 mL

## 2016-03-03 MED ORDER — KETOROLAC TROMETHAMINE 30 MG/ML IJ SOLN
INTRAMUSCULAR | Status: AC
  Filled 2016-03-03: qty 1

## 2016-03-03 MED ORDER — DEXAMETHASONE SODIUM PHOSPHATE 4 MG/ML IJ SOLN
4.0000 mg | Freq: Once | INTRAMUSCULAR | Status: AC
  Administered 2016-03-03: 4 mg via INTRAVENOUS

## 2016-03-03 MED ORDER — DIPHENHYDRAMINE HCL 25 MG PO CAPS: 25.0000 mg | ORAL_CAPSULE | Freq: Four times a day (QID) | ORAL | Status: DC | PRN

## 2016-03-03 MED ORDER — MIDAZOLAM HCL 2 MG/2ML IJ SOLN
1.0000 mg | INTRAMUSCULAR | Status: AC
  Administered 2016-03-03 (×2): 2 mg via INTRAVENOUS
  Filled 2016-03-03: qty 2

## 2016-03-03 MED ORDER — LACTATED RINGERS IV SOLN
INTRAVENOUS | Status: DC
  Administered 2016-03-03 (×2): via INTRAVENOUS

## 2016-03-03 SURGICAL SUPPLY — 39 items
APPLIER CLIP LAPSCP 10X32 DD (CLIP) ×2 IMPLANT
BAG HAMPER (MISCELLANEOUS) ×2 IMPLANT
CHLORAPREP W/TINT 26ML (MISCELLANEOUS) ×2 IMPLANT
CLOTH BEACON ORANGE TIMEOUT ST (SAFETY) ×2 IMPLANT
COVER LIGHT HANDLE STERIS (MISCELLANEOUS) ×4 IMPLANT
DECANTER SPIKE VIAL GLASS SM (MISCELLANEOUS) ×2 IMPLANT
ELECT REM PT RETURN 9FT ADLT (ELECTROSURGICAL) ×2
ELECTRODE REM PT RTRN 9FT ADLT (ELECTROSURGICAL) ×1 IMPLANT
FILTER SMOKE EVAC LAPAROSHD (FILTER) ×2 IMPLANT
FORMALIN 10 PREFIL 120ML (MISCELLANEOUS) ×2 IMPLANT
GLOVE BIOGEL PI IND STRL 7.0 (GLOVE) ×1 IMPLANT
GLOVE BIOGEL PI INDICATOR 7.0 (GLOVE) ×1
GLOVE SURG SS PI 7.5 STRL IVOR (GLOVE) ×2 IMPLANT
GOWN STRL REUS W/ TWL XL LVL3 (GOWN DISPOSABLE) ×1 IMPLANT
GOWN STRL REUS W/TWL LRG LVL3 (GOWN DISPOSABLE) ×4 IMPLANT
GOWN STRL REUS W/TWL XL LVL3 (GOWN DISPOSABLE) ×1
HEMOSTAT SNOW SURGICEL 2X4 (HEMOSTASIS) ×2 IMPLANT
INST SET LAPROSCOPIC AP (KITS) ×2 IMPLANT
IV NS IRRIG 3000ML ARTHROMATIC (IV SOLUTION) IMPLANT
KIT ROOM TURNOVER APOR (KITS) ×2 IMPLANT
MANIFOLD NEPTUNE II (INSTRUMENTS) ×2 IMPLANT
NEEDLE INSUFFLATION 14GA 120MM (NEEDLE) ×2 IMPLANT
NS IRRIG 1000ML POUR BTL (IV SOLUTION) ×2 IMPLANT
PACK LAP CHOLE LZT030E (CUSTOM PROCEDURE TRAY) ×2 IMPLANT
PAD ARMBOARD 7.5X6 YLW CONV (MISCELLANEOUS) ×2 IMPLANT
POUCH SPECIMEN RETRIEVAL 10MM (ENDOMECHANICALS) ×2 IMPLANT
SET BASIN LINEN APH (SET/KITS/TRAYS/PACK) ×2 IMPLANT
SET TUBE IRRIG SUCTION NO TIP (IRRIGATION / IRRIGATOR) IMPLANT
SLEEVE ENDOPATH XCEL 5M (ENDOMECHANICALS) ×2 IMPLANT
SPONGE GAUZE 2X2 8PLY STRL LF (GAUZE/BANDAGES/DRESSINGS) ×8 IMPLANT
STAPLER VISISTAT (STAPLE) ×2 IMPLANT
SUT VICRYL 0 UR6 27IN ABS (SUTURE) ×2 IMPLANT
TAPE CLOTH SURG 4X10 WHT LF (GAUZE/BANDAGES/DRESSINGS) ×2 IMPLANT
TROCAR ENDO BLADELESS 11MM (ENDOMECHANICALS) ×2 IMPLANT
TROCAR XCEL NON-BLD 5MMX100MML (ENDOMECHANICALS) ×2 IMPLANT
TROCAR XCEL UNIV SLVE 11M 100M (ENDOMECHANICALS) ×2 IMPLANT
TUBE CONNECTING 12X1/4 (SUCTIONS) ×2 IMPLANT
TUBING INSUFFLATION (TUBING) ×2 IMPLANT
WARMER LAPAROSCOPE (MISCELLANEOUS) ×2 IMPLANT

## 2016-03-03 NOTE — Transfer of Care (Signed)
Immediate Anesthesia Transfer of Care Note  Patient: Victoria Stein  Procedure(s) Performed: Procedure(s): LAPAROSCOPIC CHOLECYSTECTOMY (N/A)  Patient Location: PACU  Anesthesia Type:General  Level of Consciousness: awake  Airway & Oxygen Therapy: Patient Spontanous Breathing and Patient connected to nasal cannula oxygen  Post-op Assessment: Report given to RN  Post vital signs: Reviewed and stable  Last Vitals:  Vitals:   03/03/16 1100 03/03/16 1105  BP: 106/73   Pulse:    Resp: (!) 30 (!) 22  Temp:      Last Pain:  Vitals:   03/03/16 0546  TempSrc: Oral  PainSc: 0-No pain      Patients Stated Pain Goal: 1 ([id number])  Complications: No apparent anesthesia complications

## 2016-03-03 NOTE — Anesthesia Procedure Notes (Signed)
Procedure Name: Intubation Date/Time: 03/03/2016 11:24 AM Performed by: Andree Elk, Gwenetta Devos A Pre-anesthesia Checklist: Patient identified, Patient being monitored, Timeout performed, Emergency Drugs available and Suction available Patient Re-evaluated:Patient Re-evaluated prior to inductionOxygen Delivery Method: Circle system utilized Preoxygenation: Pre-oxygenation with 100% oxygen Intubation Type: IV induction, Rapid sequence and Cricoid Pressure applied Ventilation: Mask ventilation without difficulty Laryngoscope Size: Mac and 3 Grade View: Grade I Tube type: Oral Tube size: 7.0 mm Number of attempts: 1 Airway Equipment and Method: Stylet Placement Confirmation: ETT inserted through vocal cords under direct vision,  positive ETCO2 and breath sounds checked- equal and bilateral Secured at: 21 cm Tube secured with: Tape Dental Injury: Teeth and Oropharynx as per pre-operative assessment

## 2016-03-03 NOTE — Progress Notes (Addendum)
Preop MRSA and Staph PCR completed. Results negative. Consent for lap choley signed and placed in chart. 2 IV sites present. Continuous fluids infusing. SCDs on BLE. PT NPO since Midnight, last thing eaten or drank was on 03/02/16. PT seems very anxious. Educated PT on procedure and tried to answer questions to the best of my ability. CHG bath completed. PT up to bathroom as needed by self to empty bladder.

## 2016-03-03 NOTE — Addendum Note (Signed)
Addendum  created 03/03/16 1333 by Vista Deck, CRNA   Charge Capture section accepted

## 2016-03-03 NOTE — Anesthesia Preprocedure Evaluation (Signed)
Anesthesia Evaluation  Patient identified by MRN, date of birth, ID band Patient awake    Reviewed: Allergy & Precautions, NPO status , Patient's Chart, lab work & pertinent test results  Airway Mallampati: I  TM Distance: >3 FB Neck ROM: Full    Dental  (+) Teeth Intact   Pulmonary PE ( 2015)   breath sounds clear to auscultation       Cardiovascular + Peripheral Vascular Disease, +CHF ( inactive now) and + DVT   Rhythm:Regular Rate:Normal     Neuro/Psych PSYCHIATRIC DISORDERS Anxiety Depression    GI/Hepatic GERD  Controlled,  Endo/Other    Renal/GU      Musculoskeletal   Abdominal   Peds  Hematology   Anesthesia Other Findings   Reproductive/Obstetrics                             Anesthesia Physical Anesthesia Plan  ASA: III  Anesthesia Plan: General   Post-op Pain Management:    Induction: Intravenous, Rapid sequence and Cricoid pressure planned  Airway Management Planned: Oral ETT  Additional Equipment:   Intra-op Plan:   Post-operative Plan: Extubation in OR  Informed Consent: I have reviewed the patients History and Physical, chart, labs and discussed the procedure including the risks, benefits and alternatives for the proposed anesthesia with the patient or authorized representative who has indicated his/her understanding and acceptance.     Plan Discussed with:   Anesthesia Plan Comments:         Anesthesia Quick Evaluation

## 2016-03-03 NOTE — Anesthesia Postprocedure Evaluation (Signed)
Anesthesia Post Note  Patient: Victoria Stein  Procedure(s) Performed: Procedure(s) (LRB): LAPAROSCOPIC CHOLECYSTECTOMY (N/A)  Patient location during evaluation: PACU Anesthesia Type: General Level of consciousness: awake and alert Pain management: pain level controlled Vital Signs Assessment: post-procedure vital signs reviewed and stable Respiratory status: spontaneous breathing, nonlabored ventilation, respiratory function stable and patient connected to nasal cannula oxygen Cardiovascular status: blood pressure returned to baseline and stable Postop Assessment: no signs of nausea or vomiting Anesthetic complications: no     Last Vitals:  Vitals:   03/03/16 1105 03/03/16 1210  BP:  121/79  Pulse:  74  Resp: (!) 22 12  Temp:  36.9 C    Last Pain:  Vitals:   03/03/16 1210  TempSrc:   PainSc: 0-No pain                 ADAMS, AMY A

## 2016-03-03 NOTE — Op Note (Signed)
Patient:  Victoria Stein  DOB:  1970/01/15  MRN:  [id number]   Preop Diagnosis:  Cholecystitis, cholelithiasis  Postop Diagnosis:  Same  Procedure:  Laparoscopic cholecystectomy  Surgeon:  Aviva Signs, M.D.  Anes:  Gen. endotracheal  Indications:  Patient is a 46 year old white female who presents with cholecystitis secondary to cholelithiasis. The risks and benefits of the procedure including bleeding, infection, hepatobiliary injury, and the possibility of an open procedure were fully explained to the patient, who gave informed consent.  Procedure note:  The patient was placed in this supine position. After induction of general endotracheal anesthesia, the abdomen was prepped and draped using the usual sterile technique with DuraPrep. Surgical site confirmation was performed.  A supraumbilical incision was made down to the fascia. A Veress needle was introduced into the abdominal cavity and confirmation of placement was done using the saline drop test. The abdomen was then insufflated to 16 mmHg pressure. An 11 mm trocar was introduced into the abdominal cavity under direct visualization without difficulty. The patient was placed in reverse Trendelenburg position and an additional 11 mm trocar was placed in the epigastric region and 5 mm trochars were placed the right upper quadrant and right flank regions. The liver was inspected and noted to be within normal limits. The gallbladder was retracted in a dynamic fashion in order to provide a critical view of the triangle of Calot. The cystic duct was first identified. Its juncture to the infundibulum was fully identified. Endoclips were placed proximally and distally on the cystic duct, and the cystic duct was divided. This was likewise done to the cystic artery. The gallbladder was freed away from the gallbladder fossa using Bovie electrocautery. The gallbladder was delivered through the epigastric trocar site using an Endo Catch bag. The  gallbladder fossa was inspected and no abnormal bleeding or bile leakage was noted. Surgicel was placed in the gallbladder fossa. All fluid and air were then evacuated from the abdominal cavity prior to the removal of the trochars.  All wounds were irrigated with normal saline. All wounds were injected with 0.5% Sensorcaine. The supraumbilical fascia was reapproximated using an 0 Vicryl interrupted suture. All skin incisions were closed using staples. Betadine ointment and dry sterile dressings were applied.  All tape and needle counts were correct at the end of the procedure. Patient was extubated in the operating room and transferred to PACU in stable condition.  Complications:  None  EBL:  Minimal  Specimen:  Gallbladder

## 2016-03-03 NOTE — Interval H&P Note (Signed)
History and Physical Interval Note:  03/03/2016 10:44 AM  Victoria Stein  has presented today for surgery, with the diagnosis of cholecystitis  The various methods of treatment have been discussed with the patient and family. After consideration of risks, benefits and other options for treatment, the patient has consented to  Procedure(s): LAPAROSCOPIC CHOLECYSTECTOMY (N/A) as a surgical intervention .  The patient's history has been reviewed, patient examined, no change in status, stable for surgery.  I have reviewed the patient's chart and labs.  Questions were answered to the patient's satisfaction.     Aviva Signs A

## 2016-03-04 LAB — HEPATIC FUNCTION PANEL
ALBUMIN: 3.1 g/dL — AB (ref 3.5–5.0)
ALT: 113 U/L — ABNORMAL HIGH (ref 14–54)
AST: 65 U/L — AB (ref 15–41)
Alkaline Phosphatase: 96 U/L (ref 38–126)
BILIRUBIN TOTAL: 0.5 mg/dL (ref 0.3–1.2)
Bilirubin, Direct: 0.1 mg/dL (ref 0.1–0.5)
Indirect Bilirubin: 0.4 mg/dL (ref 0.3–0.9)
TOTAL PROTEIN: 6 g/dL — AB (ref 6.5–8.1)

## 2016-03-04 LAB — CBC
HEMATOCRIT: 37.3 % (ref 36.0–46.0)
Hemoglobin: 12.4 g/dL (ref 12.0–15.0)
MCH: 30.1 pg (ref 26.0–34.0)
MCHC: 33.2 g/dL (ref 30.0–36.0)
MCV: 90.5 fL (ref 78.0–100.0)
PLATELETS: 168 10*3/uL (ref 150–400)
RBC: 4.12 MIL/uL (ref 3.87–5.11)
RDW: 13.4 % (ref 11.5–15.5)
WBC: 10.2 10*3/uL (ref 4.0–10.5)

## 2016-03-04 LAB — BASIC METABOLIC PANEL
Anion gap: 6 (ref 5–15)
BUN: 7 mg/dL (ref 6–20)
CALCIUM: 8.6 mg/dL — AB (ref 8.9–10.3)
CO2: 28 mmol/L (ref 22–32)
Chloride: 106 mmol/L (ref 101–111)
Creatinine, Ser: 0.92 mg/dL (ref 0.44–1.00)
GFR calc Af Amer: >60 mL/min (ref >60)
GLUCOSE: 100 mg/dL — AB (ref 65–99)
POTASSIUM: 3.8 mmol/L (ref 3.5–5.1)
SODIUM: 140 mmol/L (ref 135–145)

## 2016-03-04 MED ORDER — CIPROFLOXACIN HCL 500 MG PO TABS: 500.0000 mg | ORAL_TABLET | Freq: Two times a day (BID) | ORAL | 0 refills | Status: DC

## 2016-03-04 NOTE — Discharge Instructions (Signed)
Laparoscopic Cholecystectomy, Care After °This sheet gives you information about how to care for yourself after your procedure. Your health care provider may also give you more specific instructions. If you have problems or questions, contact your health care provider. °What can I expect after the procedure? °After the procedure, it is common to have: °· Pain at your incision sites. You will be given medicines to control this pain. °· Mild nausea or vomiting. °· Bloating and possible shoulder pain from the air-like gas that was used during the procedure. °Follow these instructions at home: °Incision care  ° °· Follow instructions from your health care provider about how to take care of your incisions. Make sure you: °¨ Wash your hands with soap and water before you change your bandage (dressing). If soap and water are not available, use hand sanitizer. °¨ Change your dressing as told by your health care provider. °¨ Leave stitches (sutures), skin glue, or adhesive strips in place. These skin closures may need to be in place for 2 weeks or longer. If adhesive strip edges start to loosen and curl up, you may trim the loose edges. Do not remove adhesive strips completely unless your health care provider tells you to do that. °· Do not take baths, swim, or use a hot tub until your health care provider approves. Ask your health care provider if you can take showers. You may only be allowed to take sponge baths for bathing. °· Check your incision area every day for signs of infection. Check for: °¨ More redness, swelling, or pain. °¨ More fluid or blood. °¨ Warmth. °¨ Pus or a bad smell. °Activity  °· Do not drive or use heavy machinery while taking prescription pain medicine. °· Do not lift anything that is heavier than 10 lb (4.5 kg) until your health care provider approves. °· Do not play contact sports until your health care provider approves. °· Do not drive for 24 hours if you were given a medicine to help you relax  (sedative). °· Rest as needed. Do not return to work or school until your health care provider approves. °General instructions  °· Take over-the-counter and prescription medicines only as told by your health care provider. °· To prevent or treat constipation while you are taking prescription pain medicine, your health care provider may recommend that you: °¨ Drink enough fluid to keep your urine clear or pale yellow. °¨ Take over-the-counter or prescription medicines. °¨ Eat foods that are high in fiber, such as fresh fruits and vegetables, whole grains, and beans. °¨ Limit foods that are high in fat and processed sugars, such as fried and sweet foods. °Contact a health care provider if: °· You develop a rash. °· You have more redness, swelling, or pain around your incisions. °· You have more fluid or blood coming from your incisions. °· Your incisions feel warm to the touch. °· You have pus or a bad smell coming from your incisions. °· You have a fever. °· One or more of your incisions breaks open. °Get help right away if: °· You have trouble breathing. °· You have chest pain. °· You have increasing pain in your shoulders. °· You faint or feel dizzy when you stand. °· You have severe pain in your abdomen. °· You have nausea or vomiting that lasts for more than one day. °· You have leg pain. °This information is not intended to replace advice given to you by your health care provider. Make sure you discuss any   questions you have with your health care provider. °Document Released: 12/23/2004 Document Revised: 07/14/2015 Document Reviewed: 06/11/2015 °Elsevier Interactive Patient Education © 2017 Elsevier Inc. ° °

## 2016-03-04 NOTE — Discharge Summary (Signed)
Physician Discharge Summary  Patient ID: Victoria Stein MRN: [id number] DOB/AGE: 10/01/1970 46 y.o.  Admit date: 03/02/2016 Discharge date: 03/04/2016  Admission Diagnoses:Cholecystitis, cholelithiasis  Discharge Diagnoses: Same Active Problems:   Cholecystitis   Discharged Condition: good  Hospital Course: Patient is a 46 year old white female who presented emergency room with right flank pain. During her workup for possible kidney stones, she was found to have cholelithiasis with elevated liver enzyme tests. Surgery was consulted and the patient was admitted to the hospital for further evaluation and treatment. Ultrasound gallbladder revealed cholelithiasis and common bile duct. The liver enzyme tests were decreasing. She was taken to the operating room on 03/03/2016 and underwent laparoscopic cholecystectomy. She tolerated procedure well. Her postoperative course has been unremarkable. Her diet was advanced without difficulty. The patient is being discharged home on 03/04/2016 in good and improving condition.  Treatments: surgery: Laparoscopic cholecystectomy on 03/03/2016  Discharge Exam: Blood pressure (!) 106/52, pulse 72, temperature 98.6 F (37 C), temperature source Oral, resp. rate 17, height 5\' 4"  (1.626 m), weight 73 kg (161 lb), last menstrual period 01/22/2015, SpO2 97 %. General appearance: alert, cooperative and no distress Resp: clear to auscultation bilaterally Cardio: regular rate and rhythm, S1, S2 normal, no murmur, click, rub or gallop GI: Soft, incisions healing well.  Disposition: 01-Home or Self Care  Discharge Instructions    Increase activity slowly    Complete by:  As directed      Allergies as of 03/04/2016      Reactions   Amoxicillin Swelling      Medication List    TAKE these medications   acetaminophen 500 MG tablet Commonly known as:  TYLENOL Take 500 mg by mouth every 6 (six) hours as needed for moderate pain.   buPROPion 150 MG 24 hr  tablet Commonly known as:  WELLBUTRIN XL Take 1 tablet by mouth daily.   ciprofloxacin 500 MG tablet Commonly known as:  CIPRO Take 1 tablet (500 mg total) by mouth 2 (two) times daily.   ibuprofen 200 MG tablet Commonly known as:  ADVIL,MOTRIN Take 200 mg by mouth every 6 (six) hours as needed.      Follow-up Information    Victoria Signs, MD. Schedule an appointment as soon as possible for a visit on 03/13/2016.   Specialty:  General Surgery Contact information: [id number] [place] [id number] [phone]           Signed: Aviva Stein 03/04/2016, 8:16 AM

## 2016-03-04 NOTE — Care Management Note (Signed)
Case Management Note  Patient Details  Name: Victoria Stein MRN: [id number] Date of Birth: [birth year]  Subjective/Objective:                  Pt admitted with cholecystitis. S/p lap chole. Chart reviewed for CM needs. Pt is from home, ind with ADL's. She has insurance with drug coverage, PCP-John Hilma Favors and no difficulty with transportation to appointments. She plans to return home with self care at DC.   Action/Plan: Pt discharging home today.   Expected Discharge Date:  03/04/16               Expected Discharge Plan:  Home/Self Care  In-House Referral:  NA  Discharge planning Services  CM Consult  Post Acute Care Choice:  NA Choice offered to:  NA  Status of Service:  Completed, signed off Sherald Barge, RN 03/04/2016, 9:02 AM

## 2016-03-04 NOTE — Progress Notes (Signed)
Patient with orders to be discharge home. Discharge instructions given, patient verbalized understanding. Patient stable. Patient left in private vehicle with spouse.  

## 2016-03-05 ENCOUNTER — Encounter (HOSPITAL_COMMUNITY): Payer: Self-pay | Admitting: General Surgery

## 2016-03-06 ENCOUNTER — Telehealth: Payer: Self-pay | Admitting: General Surgery

## 2016-03-09 NOTE — Telephone Encounter (Signed)
Patient concerned about incision. Not infected, just bruised. Reassured patient. To follow-up in my office next week.

## 2016-03-12 ENCOUNTER — Encounter: Payer: Self-pay | Admitting: General Surgery

## 2016-03-12 ENCOUNTER — Ambulatory Visit (INDEPENDENT_AMBULATORY_CARE_PROVIDER_SITE_OTHER): Payer: BLUE CROSS/BLUE SHIELD | Admitting: General Surgery

## 2016-03-12 VITALS — BP 122/67 | HR 79 | Temp 99.1°F | Resp 20 | Ht 64.0 in | Wt 164.0 lb

## 2016-03-12 DIAGNOSIS — Z09 Encounter for follow-up examination after completed treatment for conditions other than malignant neoplasm: Secondary | ICD-10-CM

## 2016-03-12 NOTE — Progress Notes (Signed)
Subjective:     Victoria Stein  Status post laparoscopic cholecystectomy. Doing well. Only occasional loose stools when moving eating. No fever or chills. Objective:    BP 122/67   Pulse 79   Temp 99.1 F (37.3 C)   Resp 20   Ht 5\' 4"  (1.626 m)   Wt 164 lb (74.4 kg)   LMP 01/22/2015   BMI 28.15 kg/m   General:  alert, cooperative and no distress  Abdomen is soft. Incisions healing well. Staples removed, Steri-Strips applied.     Assessment:    Doing well postoperatively.    Plan:  Sensation of rushing to the bathroom should resolve with time. Was instructed to eat yogurt as needed. May gradually return to normal activity.

## 2016-03-27 DIAGNOSIS — Z1389 Encounter for screening for other disorder: Secondary | ICD-10-CM | POA: Diagnosis not present

## 2016-03-27 DIAGNOSIS — E663 Overweight: Secondary | ICD-10-CM | POA: Diagnosis not present

## 2016-03-27 DIAGNOSIS — R6889 Other general symptoms and signs: Secondary | ICD-10-CM | POA: Diagnosis not present

## 2016-03-27 DIAGNOSIS — R39191 Need to immediately re-void: Secondary | ICD-10-CM | POA: Diagnosis not present

## 2016-03-27 DIAGNOSIS — Z6828 Body mass index (BMI) 28.0-28.9, adult: Secondary | ICD-10-CM | POA: Diagnosis not present

## 2016-04-01 DIAGNOSIS — R39191 Need to immediately re-void: Secondary | ICD-10-CM | POA: Diagnosis not present

## 2016-04-01 DIAGNOSIS — R39198 Other difficulties with micturition: Secondary | ICD-10-CM | POA: Diagnosis not present

## 2016-04-04 ENCOUNTER — Other Ambulatory Visit (HOSPITAL_COMMUNITY): Payer: Self-pay | Admitting: Family Medicine

## 2016-04-04 ENCOUNTER — Ambulatory Visit (HOSPITAL_COMMUNITY)
Admission: RE | Admit: 2016-04-04 | Discharge: 2016-04-04 | Disposition: A | Discharge status: Home / Self Care | Payer: BLUE CROSS/BLUE SHIELD | Source: Ambulatory Visit | Attending: Family Medicine | Admitting: Family Medicine

## 2016-04-04 DIAGNOSIS — R2242 Localized swelling, mass and lump, left lower limb: Secondary | ICD-10-CM | POA: Diagnosis not present

## 2016-04-04 DIAGNOSIS — M7989 Other specified soft tissue disorders: Secondary | ICD-10-CM

## 2016-04-04 DIAGNOSIS — Z1389 Encounter for screening for other disorder: Secondary | ICD-10-CM | POA: Diagnosis not present

## 2016-04-04 DIAGNOSIS — M79605 Pain in left leg: Secondary | ICD-10-CM | POA: Diagnosis not present

## 2016-04-04 DIAGNOSIS — M79662 Pain in left lower leg: Secondary | ICD-10-CM | POA: Insufficient documentation

## 2016-04-04 DIAGNOSIS — Z6828 Body mass index (BMI) 28.0-28.9, adult: Secondary | ICD-10-CM | POA: Diagnosis not present

## 2016-04-04 DIAGNOSIS — Z86711 Personal history of pulmonary embolism: Secondary | ICD-10-CM | POA: Diagnosis not present

## 2016-05-15 DIAGNOSIS — L309 Dermatitis, unspecified: Secondary | ICD-10-CM | POA: Diagnosis not present

## 2016-05-15 DIAGNOSIS — L814 Other melanin hyperpigmentation: Secondary | ICD-10-CM | POA: Diagnosis not present

## 2016-05-15 DIAGNOSIS — D229 Melanocytic nevi, unspecified: Secondary | ICD-10-CM | POA: Diagnosis not present

## 2016-05-19 DIAGNOSIS — N898 Other specified noninflammatory disorders of vagina: Secondary | ICD-10-CM | POA: Diagnosis not present

## 2016-05-19 DIAGNOSIS — R350 Frequency of micturition: Secondary | ICD-10-CM | POA: Diagnosis not present

## 2016-05-19 DIAGNOSIS — Z6827 Body mass index (BMI) 27.0-27.9, adult: Secondary | ICD-10-CM | POA: Diagnosis not present

## 2016-05-20 ENCOUNTER — Ambulatory Visit (INDEPENDENT_AMBULATORY_CARE_PROVIDER_SITE_OTHER): Payer: BLUE CROSS/BLUE SHIELD

## 2016-05-20 ENCOUNTER — Ambulatory Visit (INDEPENDENT_AMBULATORY_CARE_PROVIDER_SITE_OTHER): Payer: BLUE CROSS/BLUE SHIELD | Admitting: Orthopedic Surgery

## 2016-05-20 ENCOUNTER — Encounter: Payer: Self-pay | Admitting: Orthopedic Surgery

## 2016-05-20 VITALS — BP 108/74 | HR 79 | Ht 64.0 in | Wt 160.0 lb

## 2016-05-20 DIAGNOSIS — M25562 Pain in left knee: Secondary | ICD-10-CM | POA: Diagnosis not present

## 2016-05-20 DIAGNOSIS — M23322 Other meniscus derangements, posterior horn of medial meniscus, left knee: Secondary | ICD-10-CM

## 2016-05-20 DIAGNOSIS — M25462 Effusion, left knee: Secondary | ICD-10-CM | POA: Diagnosis not present

## 2016-05-20 NOTE — Patient Instructions (Addendum)
Meniscus Tear A meniscus tear is a knee injury in which a piece of the meniscus is torn. The meniscus is a thick, rubbery, wedge-shaped cartilage in the knee. Two menisci are located in each knee. They sit between the upper bone (femur) and lower bone (tibia) that make up the knee joint. Each meniscus acts as a shock absorber for the knee. A torn meniscus is one of the most common types of knee injuries. This injury can range from mild to severe. Surgery may be needed for a severe tear. What are the causes? This injury may be caused by any squatting, twisting, or pivoting movement. Sports-related injuries are the most common cause. These often occur from:  Running and stopping suddenly.  Changing direction.  Being tackled or knocked off your feet. As people get older, their meniscus gets thinner and weaker. In these people, tears can happen more easily, such as from climbing stairs. What increases the risk? This injury is more likely to happen to:  People who play contact sports.  Males.  People who are 51?47 years of age. What are the signs or symptoms? Symptoms of this injury include:  Knee pain, especially at the side of the knee joint. You may feel pain when the injury occurs, or you may only hear a pop and feel pain later.  A feeling that your knee is clicking, catching, locking, or giving way.  Not being able to fully bend or extend your knee.  Bruising or swelling in your knee. How is this diagnosed? This injury may be diagnosed based on your symptoms and a physical exam. The physical exam may include:  Moving your knee in different ways.  Feeling for tenderness.  Listening for a clicking sound.  Checking if your knee locks or catches. You may also have tests, such as:  X-rays.  MRI.  A procedure to look inside your knee with a narrow surgical telescope (arthroscopy). You may be referred to a knee specialist (orthopedic surgeon). How is this treated? Treatment  for this injury depends on the severity of the tear. Treatment for a mild tear may include:  Rest.  Medicine to reduce pain and swelling. This is usually a nonsteroidal anti-inflammatory drug (NSAID).  A knee brace or an elastic sleeve or wrap.  Using crutches or a walker to keep weight off your knee and to help you walk.  Exercises to strengthen your knee (physical therapy). You may need surgery if you have a severe tear or if other treatments are not working. Follow these instructions at home: Managing pain and swelling   Take over-the-counter and prescription medicines only as told by your health care provider.  If directed, apply ice to the injured area:  Put ice in a plastic bag.  Place a towel between your skin and the bag.  Leave the ice on for 20 minutes, 2-3 times per day.  Raise (elevate) the injured area above the level of your heart while you are sitting or lying down. Activity   Do not use the injured limb to support your body weight until your health care provider says that you can. Use crutches or a walker as told by your health care provider.  Return to your normal activities as told by your health care provider. Ask your health care provider what activities are safe for you.  Perform range-of-motion exercises only as told by your health care provider.  Begin doing exercises to strengthen your knee and leg muscles only as told by your  health care provider. After you recover, your health care provider may recommend these exercises to help prevent another injury. General instructions   Use a knee brace or elastic wrap as told by your health care provider.  Keep all follow-up visits as told by your health care provider. This is important. Contact a health care provider if:  You have a fever.  Your knee becomes red, tender, or swollen.  Your pain medicine is not helping.  Your symptoms get worse or do not improve after 2 weeks of home care. This  information is not intended to replace advice given to you by your health care provider. Make sure you discuss any questions you have with your health care provider. Document Released: 03/15/2002 Document Revised: 05/31/2015 Document Reviewed: 04/17/2014 Elsevier Interactive Patient Education  2017 [employer] Knee Arthroscopy Knee arthroscopy is a surgical procedure that is used to examine the inside of your knee joint and repair any damage. The surgeon puts a small, lighted instrument with a camera on the tip (arthroscope) through a small incision in your knee. The camera sends pictures to a monitor in the operating room. Your surgeon uses those pictures to guide the surgical instruments through other incisions to the area of damage. Knee arthroscopy can be used to treat many types of knee problems. It may be used:  To repair a torn ligament.  To repair or remove damaged tissue.  To remove a fluid-filled sac (cyst) from your knee. Tell a health care provider about:  Any allergies you have.  All medicines you are taking, including vitamins, herbs, eye drops, creams, and over-the-counter medicines.  Any problems you or family members have had with anesthetic medicines.  Any blood disorders you have.  Any surgeries you have had.  Any medical conditions you have. What are the risks? Generally, this is a safe procedure. However, problems may occur, including:  Infection.  Bleeding.  Damage to blood vessels, nerves, or structures of your knee.  A blood clot that forms in your leg and travels to your lung.  Failure to relieve symptoms. What happens before the procedure?  Ask your health care provider about:  Changing or stopping your regular medicines. This is especially important if you are taking diabetes medicines or blood thinners.  Taking medicines such as aspirin and ibuprofen. These medicines can thin your blood. Do not take these medicines before your procedure if  your health care provider instructs you not to.  Follow your health care provider's instructions about eating or drinking restrictions.  Plan to have someone take you home after the procedure.  If you go home right after the procedure, plan to have someone with you for 24 hours.  Do not drink alcohol unless your health care provider says that you can.  Do not use any tobacco products, including cigarettes, chewing tobacco, or electronic cigarettes unless your health care provider says that you can. If you need help quitting, ask your health care provider.  You may have a physical exam. What happens during the procedure?  An IV tube will be inserted into one of your veins.  You will be given one or more of the following:  A medicine that helps you relax (sedative).  A medicine that numbs the area (local anesthetic).  A medicine that makes you fall asleep (general anesthetic).  A medicine that is injected into your spine that numbs the area below and slightly above the injection site (spinal anesthetic).  A medicine that is injected into  an area of your body that numbs everything below the injection site (regional anesthetic).  A cuff may be placed around your upper leg to slow bleeding during the procedure.  The surgeon will make a small number of incisions around your knee.  Your knee joint will be flushed and filled with a germ-free (sterile) solution.  The arthroscope will be passed through an incision into your knee joint.  More instruments will be passed through other incisions to repair your knee as needed.  The fluid will be removed from your knee.  The incisions will be closed with adhesive strips or stitches (sutures).  A bandage (dressing) will be placed over your knee. The procedure may vary among health care providers and hospitals. What happens after the procedure?  Your blood pressure, heart rate, breathing rate and blood oxygen level will be monitored  often until the medicines you were given have worn off.  You may be given medicine for pain.  You may get crutches to help you walk without using your knee to support your body weight.  You may have to wear compression stockings. These stocking help to prevent blood clots and reduce swelling in your legs. This information is not intended to replace advice given to you by your health care provider. Make sure you discuss any questions you have with your health care provider. Document Released: 12/21/1999 Document Revised: 05/31/2015 Document Reviewed: 12/19/2013 Elsevier Interactive Patient Education  2017 [employer].

## 2016-05-20 NOTE — Progress Notes (Signed)
NEW PATIENT OFFICE VISIT    Chief Complaint  Patient presents with  . New Patient (Initial Visit)    Left knee pain    This is a 46 year old female who had gallbladder surgery about 3 months ago in 2 weeks later started having pain and swelling in her left knee. She was worked up for blood clot and that was normal. She does have a history of DVT.  She complains of pain swelling decreased range of motion and difficulty with bending her left knee associated with dull anterior medial knee pain which had a period of getting better then got worse again. She does have a slight limp when she is walking.  Prior treatment    Review of Systems  Constitutional: Negative for chills and fever.  Musculoskeletal: Positive for joint pain. Negative for back pain and falls.    Past Medical History:  Diagnosis Date  . Acute pulmonary embolism ([place]) 03/08/2013  . Chronic anxiety   . Depression   . Diastolic dysfunction 4/0/3474   Grade 2  . DUB (dysfunctional uterine bleeding) 11/2013  . DVT of upper extremity (deep vein thrombosis) ([place]) 02/2013   Associated with IV needle  . Microcytic anemia 03/09/2013   Ferritin 8   . Normal cardiac stress test 2009   normal coronary CT scan  . Parotid mass 02/2013    Benign; status post resection by Dr. Constance Holster    Past Surgical History:  Procedure Laterality Date  . ABDOMINAL HYSTERECTOMY    . ABLATION  12/2013  . CHOLECYSTECTOMY N/A 03/03/2016   Procedure: LAPAROSCOPIC CHOLECYSTECTOMY;  Surgeon: Aviva Signs, MD;  Location: [facility];  Service: General;  Laterality: N/A;  . PAROTID GLAND TUMOR EXCISION    . WISDOM TOOTH EXTRACTION      Family History  Problem Relation Age of Onset  . Diabetes Mother   . Heart disease Father    Social History  Substance Use Topics  . Smoking status: Never Smoker  . Smokeless tobacco: Never Used     Comment: Never smoked  . Alcohol use No    LMP 01/22/2015   Physical Exam  Constitutional: She is oriented to  person, place, and time. She appears well-developed and well-nourished.  Musculoskeletal:       Left knee: She exhibits effusion.  Neurological: She is alert and oriented to person, place, and time.  Psychiatric: She has a normal mood and affect.  Vitals reviewed.   Right Knee Exam   Tenderness  The patient is experiencing no tenderness.     Range of Motion  Extension: normal  Flexion: normal   Muscle Strength   The patient has normal right knee strength.  Tests  McMurray:  Medial - negative Lateral - negative Drawer:       Anterior - negative    Posterior - negative Varus: negative Valgus: negative  Other  Erythema: absent Scars: absent Sensation: normal Pulse: present Swelling: none   Left Knee Exam   Tenderness  The patient is experiencing tenderness in the medial joint line.  Range of Motion  Extension: normal  Flexion: normal   Muscle Strength   The patient has normal left knee strength.  Tests  McMurray:  Medial - positive Lateral - negative Drawer:       Anterior - negative     Posterior - negative Varus: negative Valgus: negative  Other  Erythema: absent Scars: absent Sensation: normal Pulse: present Swelling: none Effusion: effusion present      No orders  of the defined types were placed in this encounter.   Encounter Diagnoses  Name Primary?  . Acute pain of left knee   . Derangement of posterior horn of medial meniscus of left knee Yes  . Effusion of knee joint, left    X-ray of the left knee Mild to meet moderate OA left knee with moderate size joint effusion  PLAN:   Aspiration injection left knee  Procedure note injection and aspiration left knee joint  Verbal consent was obtained to aspirate and inject the left knee joint   Timeout was completed to confirm the site of aspiration and injection  An 18-gauge needle was used to aspirate the left knee joint from a suprapatellar lateral approach.  The medications used  were 40 mg of Depo-Medrol and 1% lidocaine 3 cc  Anesthesia was provided by ethyl chloride and the skin was prepped with alcohol.  After cleaning the skin with alcohol an 18-gauge needle was used to aspirate the right knee joint.  We obtained 20 cc of fluid  We followed this by injection of 40 mg of Depo-Medrol and 3 cc 1% lidocaine.  There were no complications. A sterile bandage was applied.   MRI left knee

## 2016-05-27 DIAGNOSIS — R3915 Urgency of urination: Secondary | ICD-10-CM | POA: Diagnosis not present

## 2016-05-29 ENCOUNTER — Ambulatory Visit (HOSPITAL_COMMUNITY)
Admission: RE | Admit: 2016-05-29 | Discharge: 2016-05-29 | Disposition: A | Discharge status: Home / Self Care | Payer: BLUE CROSS/BLUE SHIELD | Source: Ambulatory Visit | Attending: Orthopedic Surgery | Admitting: Orthopedic Surgery

## 2016-05-29 DIAGNOSIS — M25461 Effusion, right knee: Secondary | ICD-10-CM | POA: Insufficient documentation

## 2016-05-29 DIAGNOSIS — M23322 Other meniscus derangements, posterior horn of medial meniscus, left knee: Secondary | ICD-10-CM

## 2016-05-29 DIAGNOSIS — M25562 Pain in left knee: Secondary | ICD-10-CM | POA: Diagnosis not present

## 2016-06-06 ENCOUNTER — Ambulatory Visit: Payer: BLUE CROSS/BLUE SHIELD | Admitting: Orthopedic Surgery

## 2016-06-09 ENCOUNTER — Ambulatory Visit: Payer: BLUE CROSS/BLUE SHIELD | Admitting: Orthopedic Surgery

## 2016-06-09 ENCOUNTER — Ambulatory Visit (INDEPENDENT_AMBULATORY_CARE_PROVIDER_SITE_OTHER): Payer: BLUE CROSS/BLUE SHIELD | Admitting: Orthopedic Surgery

## 2016-06-09 DIAGNOSIS — M25462 Effusion, left knee: Secondary | ICD-10-CM | POA: Diagnosis not present

## 2016-06-09 NOTE — Progress Notes (Signed)
Progress Note   Patient ID: Victoria Stein, female   DOB: 1970-08-02, 46 y.o.   MRN: [id number]  MRI follow-up  HPI Victoria Stein is a 46 y.o. female.   HPI The patient had a swollen left knee and we gave her a cortisone shot. She had no history of trauma we thought she had an eye meniscal tear  Review of Systems Review of Systems    Examination LMP 01/22/2015    Ortho Exam Normal ambulation  Medical decision-making Diagnosis, Data, Plan (risk) I interpret this as a normal MRI MRI review shows no evidence of intra-articular pathology the patient has returned to normal walking and no pain she will follow-up as needed  Arther Abbott, MD 06/09/2016 3:15 PM

## 2016-08-29 DIAGNOSIS — Z23 Encounter for immunization: Secondary | ICD-10-CM | POA: Diagnosis not present

## 2016-09-15 ENCOUNTER — Encounter (HOSPITAL_COMMUNITY): Payer: Self-pay | Admitting: Emergency Medicine

## 2016-09-15 ENCOUNTER — Emergency Department (HOSPITAL_COMMUNITY): Payer: BLUE CROSS/BLUE SHIELD

## 2016-09-15 ENCOUNTER — Emergency Department (HOSPITAL_COMMUNITY)
Admission: EM | Admit: 2016-09-15 | Discharge: 2016-09-15 | Disposition: A | Discharge status: Home / Self Care | Payer: BLUE CROSS/BLUE SHIELD | Attending: Emergency Medicine | Admitting: Emergency Medicine

## 2016-09-15 DIAGNOSIS — R2232 Localized swelling, mass and lump, left upper limb: Secondary | ICD-10-CM | POA: Diagnosis not present

## 2016-09-15 DIAGNOSIS — M79602 Pain in left arm: Secondary | ICD-10-CM | POA: Insufficient documentation

## 2016-09-15 DIAGNOSIS — Z79899 Other long term (current) drug therapy: Secondary | ICD-10-CM | POA: Diagnosis not present

## 2016-09-15 DIAGNOSIS — Z7982 Long term (current) use of aspirin: Secondary | ICD-10-CM | POA: Diagnosis not present

## 2016-09-15 DIAGNOSIS — R52 Pain, unspecified: Secondary | ICD-10-CM

## 2016-09-15 NOTE — Discharge Instructions (Signed)
Please use warm compresses as needed for pain.  Recheck with your doctor if you have any ongoing symptoms or concerns for clot.  There is no evidence of blood clots seen on the study today.

## 2016-09-15 NOTE — ED Triage Notes (Addendum)
Pt has hx of DVT and PE. Pt states left arm started hurting again last night like it did last time she had clot in the left arm. Denies sob. Radial pulses present. mildy hard knot noted to posterior left lower arm

## 2016-09-15 NOTE — ED Provider Notes (Signed)
[facility] DEPT Provider Note   CSN: [id number] Arrival date & time: 09/15/16  1724     History   Chief Complaint Chief Complaint  Patient presents with  . Arm Pain    HPI Victoria Stein is a 46 y.o. female.  HPI this is a 46 year old female with a history of PE after a catheter induced left upper extremity DVT 3 years ago. She was on anticoagulation for 6 months. She noted some pain in the left forearm yesterday and was concerned that she would have a blood clot again. She denies any chest pain or dyspnea.  Past Medical History:  Diagnosis Date  . Acute pulmonary embolism ([place]) 03/08/2013  . Chronic anxiety   . Depression   . Diastolic dysfunction 04/14/6757   Grade 2  . DUB (dysfunctional uterine bleeding) 11/2013  . DVT of upper extremity (deep vein thrombosis) ([place]) 02/2013   Associated with IV needle  . Microcytic anemia 03/09/2013   Ferritin 8   . Normal cardiac stress test 2009   normal coronary CT scan  . Parotid mass 02/2013    Benign; status post resection by Dr. Constance Holster    Patient Active Problem List   Diagnosis Date Noted  . Cholecystitis 03/02/2016  . Abnormal LFTs 06/27/2013  . GERD (gastroesophageal reflux disease) 06/23/2013  . Diastolic dysfunction 16/38/4665  . Iron deficiency anemia due to chronic blood loss 03/09/2013  . Acute pulmonary embolism ([place]) 03/08/2013  . DVT of upper extremity (deep vein thrombosis) ([place]) 03/08/2013  . Acute anxiety 03/08/2013    Past Surgical History:  Procedure Laterality Date  . ABDOMINAL HYSTERECTOMY    . ABLATION  12/2013  . CHOLECYSTECTOMY N/A 03/03/2016   Procedure: LAPAROSCOPIC CHOLECYSTECTOMY;  Surgeon: Aviva Signs, MD;  Location: [facility];  Service: General;  Laterality: N/A;  . PAROTID GLAND TUMOR EXCISION    . WISDOM TOOTH EXTRACTION      OB History    No data available       Home Medications    Prior to Admission medications   Medication Sig Start Date End Date Taking? Authorizing Provider    acetaminophen (TYLENOL) 500 MG tablet Take 500 mg by mouth every 6 (six) hours as needed for moderate pain.    [provider]  aspirin 325 MG EC tablet Take 325 mg by mouth daily.    [provider]  buPROPion (WELLBUTRIN XL) 150 MG 24 hr tablet Take 1 tablet by mouth daily. 06/16/15   [provider]  ciprofloxacin (CIPRO) 500 MG tablet Take 1 tablet (500 mg total) by mouth 2 (two) times daily. Patient not taking: Reported on 05/20/2016 03/04/16   Aviva Signs, MD  ibuprofen (ADVIL,MOTRIN) 200 MG tablet Take 200 mg by mouth every 6 (six) hours as needed.    [provider]    Family History Family History  Problem Relation Age of Onset  . Diabetes Mother   . Rheumatologic disease Mother   . Hypertension Mother   . Heart disease Father   . Heart failure Father     Social History Social History  Substance Use Topics  . Smoking status: Never Smoker  . Smokeless tobacco: Never Used     Comment: Never smoked  . Alcohol use No     Allergies   Amoxicillin   Review of Systems Review of Systems  All other systems reviewed and are negative.    Physical Exam Updated Vital Signs BP 133/79 (BP Location: Right Arm)   Pulse 82  Temp 98.3 F (36.8 C) (Oral)   Resp 20   Ht 1.6 m (5\' 3" )   Wt 72.6 kg (160 lb)   LMP 01/22/2015   SpO2 100%   BMI 28.34 kg/m   Physical Exam  Constitutional: She is oriented to person, place, and time. She appears well-developed and well-nourished. No distress.  HENT:  Head: Normocephalic and atraumatic.  Right Ear: External ear normal.  Left Ear: External ear normal.  Nose: Nose normal.  Eyes: Pupils are equal, round, and reactive to light. Conjunctivae and EOM are normal.  Neck: Normal range of motion. Neck supple.  Cardiovascular: Normal rate.   Pulmonary/Chest: Effort normal.  Musculoskeletal: Normal range of motion.  Left upper extremity without swelling vessels are soft and nontender radial pulses  2+ full active range of motion  Neurological: She is alert and oriented to person, place, and time. She exhibits normal muscle tone. Coordination normal.  Skin: Skin is warm and dry.  Psychiatric: She has a normal mood and affect. Her behavior is normal. Thought content normal.  Nursing note and vitals reviewed.    ED Treatments / Results  Labs (all labs ordered are listed, but only abnormal results are displayed) Labs Reviewed - No data to display  EKG  EKG Interpretation None       Radiology US Venous Img Upper Uni Left  Result Date: 09/15/2016 CLINICAL DATA:  Left upper extremity pain x1 day EXAM: LEFT UPPER EXTREMITY VENOUS DOPPLER ULTRASOUND TECHNIQUE: Gray-scale sonography with graded compression, as well as color Doppler and duplex ultrasound were performed to evaluate the upper extremity deep venous system from the level of the subclavian vein and including the jugular, axillary, basilic, radial, ulnar and upper cephalic vein. Spectral Doppler was utilized to evaluate flow at rest and with distal augmentation maneuvers. COMPARISON:  06/17/2013 FINDINGS: Contralateral Subclavian Vein: Respiratory phasicity is normal and symmetric with the symptomatic side. No evidence of thrombus. Normal compressibility. Internal Jugular Vein: No evidence of thrombus. Normal compressibility, respiratory phasicity and response to augmentation. Subclavian Vein: No evidence of thrombus. Normal compressibility, respiratory phasicity and response to augmentation. Axillary Vein: No evidence of thrombus. Normal compressibility, respiratory phasicity and response to augmentation. Cephalic Vein: No evidence of thrombus. Normal compressibility, respiratory phasicity and response to augmentation. Basilic Vein: No evidence of thrombus. Normal compressibility, respiratory phasicity and response to augmentation. Brachial Veins: No evidence of thrombus. Normal compressibility, respiratory phasicity and response to  augmentation. Radial Veins: No evidence of thrombus.  Normal compressibility. Ulnar Veins: No evidence of thrombus.  Normal compressibility. Venous Reflux:  None visualized. Other Findings: Forearm echogenic subcutaneous nodules measuring 0.9 cm x 0.8 cm laterally and 1.9 x 1.7 cm medially are nonspecific but suspect small lipomas. IMPRESSION: 1. No evidence of DVT within the left upper extremity. 2. Subcutaneous soft tissue nodules of the forearm consistent with small lipomas. Electronically Signed   By: Ashley Royalty M.D.   On: 09/15/2016 18:21    Procedures Procedures (including critical care time)  Medications Ordered in ED Medications - No data to display   Initial Impression / Assessment and Plan / ED Course  I have reviewed the triage vital signs and the nursing notes.  Pertinent labs & imaging results that were available during my care of the patient were reviewed by me and considered in my medical decision making (see chart for details).      Final Clinical Impressions(s) / ED Diagnoses   Final diagnoses:  Left arm pain    New  Prescriptions New Prescriptions   No medications on file     Pattricia Boss, MD 09/15/16 [phone]

## 2016-10-09 ENCOUNTER — Other Ambulatory Visit: Payer: Self-pay

## 2016-10-09 DIAGNOSIS — D492 Neoplasm of unspecified behavior of bone, soft tissue, and skin: Secondary | ICD-10-CM | POA: Diagnosis not present

## 2016-10-09 DIAGNOSIS — D229 Melanocytic nevi, unspecified: Secondary | ICD-10-CM | POA: Diagnosis not present

## 2016-10-09 DIAGNOSIS — L219 Seborrheic dermatitis, unspecified: Secondary | ICD-10-CM | POA: Diagnosis not present

## 2016-10-09 DIAGNOSIS — L821 Other seborrheic keratosis: Secondary | ICD-10-CM | POA: Diagnosis not present

## 2016-10-31 ENCOUNTER — Ambulatory Visit: Payer: BLUE CROSS/BLUE SHIELD | Admitting: Orthopedic Surgery

## 2016-11-03 ENCOUNTER — Ambulatory Visit (INDEPENDENT_AMBULATORY_CARE_PROVIDER_SITE_OTHER): Payer: BLUE CROSS/BLUE SHIELD | Admitting: Orthopedic Surgery

## 2016-11-03 VITALS — BP 111/74 | HR 78 | Ht 64.0 in | Wt 160.0 lb

## 2016-11-03 DIAGNOSIS — M25562 Pain in left knee: Secondary | ICD-10-CM

## 2016-11-03 DIAGNOSIS — G8929 Other chronic pain: Secondary | ICD-10-CM | POA: Diagnosis not present

## 2016-11-03 DIAGNOSIS — M25462 Effusion, left knee: Secondary | ICD-10-CM | POA: Diagnosis not present

## 2016-11-03 NOTE — Progress Notes (Signed)
Progress Note   Patient ID: Victoria Stein, female   DOB: 01-28-1970, 46 y.o.   MRN: [id number]  Chief Complaint  Patient presents with  . Follow-up    Recheck on left knee.    46-YEAR-OLD FEMALE PREVIOUSLY WORKED UP FOR LEFT KNEE EFFUSION PRESENTS BACK WITH A one-week history of spontaneous knee swelling without trauma. When the knee was swollen it was mildly painful with a dull constant ache and caused her to have trouble bending her knee. She did not take anything but the knee swelling went down and now she can bend her knee normally  Family history of rheumatoid arthritis mother diagnosed at 25 years old     Review of Systems  Constitutional: Negative for fever and malaise/fatigue.  Musculoskeletal: Positive for joint pain.       No other joints are swollen  Skin: Negative.   Neurological: Positive for tingling.   No outpatient prescriptions have been marked as taking for the 11/03/16 encounter (Office Visit) with Carole Civil, MD.    Past Medical History:  Diagnosis Date  . Acute pulmonary embolism ([place]) 03/08/2013  . Chronic anxiety   . Depression   . Diastolic dysfunction 06/13/6718   Grade 2  . DUB (dysfunctional uterine bleeding) 11/2013  . DVT of upper extremity (deep vein thrombosis) ([place]) 02/2013   Associated with IV needle  . Microcytic anemia 03/09/2013   Ferritin 8   . Normal cardiac stress test 2009   normal coronary CT scan  . Parotid mass 02/2013    Benign; status post resection by Dr. Constance Holster     Allergies  Allergen Reactions  . Amoxicillin Swelling    BP 111/74   Pulse 78   Ht 5\' 4"  (1.626 m)   Wt 160 lb (72.6 kg)   LMP 01/22/2015   BMI 27.46 kg/m    Physical Exam  Constitutional: She is oriented to person, place, and time and well-developed, well-nourished, and in no distress. No distress.  Cardiovascular: Normal rate and intact distal pulses.   Neurological: She is alert and oriented to person, place, and time. She has normal reflexes.  She exhibits normal muscle tone. Coordination normal.  Skin: Skin is warm and dry. No rash noted. She is not diaphoretic. No erythema. No pallor.  Psychiatric: Judgment normal.   Gen. appearance the patient's appearance is normal with normal grooming and  hygiene The patient is oriented to person place and time Mood and affect are normal   Left Knee Exam  Swelling: Mild Effusion: Yes  Tenderness  None  Range of Motion  Normal left knee ROM  Muscle Strength  Normal left knee strength  Tests  McMurrays:  Medial - Negative       Drawer:       Anterior - Negative       Right Knee Exam   Tenderness  None  Range of Motion  Normal right knee ROM  Muscle Strength  Normal right knee strength      Medical decision-making:  MRI DONE PRIOR  IMPRESSION: Small nonspecific joint effusion.  Otherwise, normal exam.     Electronically Signed   By: Lorriane Shire M.D.   On: 05/30/2016 08:38    Encounter Diagnoses  Name Primary?  . Chronic pain of left knee Yes  . Effusion of knee joint, left     Recommend workup for rheumatoid arthritis  Arther Abbott, MD 11/03/2016 9:14 AM

## 2016-11-04 LAB — C-REACTIVE PROTEIN: CRP: 2.2 mg/L (ref <8.0)

## 2016-11-04 LAB — ANTI-NUCLEAR AB-TITER (ANA TITER)

## 2016-11-04 LAB — SEDIMENTATION RATE: Sed Rate: 11 mm/h (ref 0–20)

## 2016-11-04 LAB — RHEUMATOID FACTOR: Rhuematoid fact SerPl-aCnc: <14 IU/mL (ref <14)

## 2016-11-04 LAB — ANA: ANA: POSITIVE — AB

## 2016-11-05 ENCOUNTER — Telehealth: Payer: Self-pay | Admitting: Orthopedic Surgery

## 2016-11-05 NOTE — Telephone Encounter (Signed)
He has a low titer +ANA, I can advise her , please review, what is your next step in plan of care ?

## 2016-11-05 NOTE — Telephone Encounter (Signed)
I will call and let her know what's going on

## 2016-11-05 NOTE — Telephone Encounter (Signed)
Patient called stating she was here Monday and was sent for lab work.  She wants to know if we got the results or if she needs to schedule an appointment back here in the office.  Please advise

## 2016-11-06 ENCOUNTER — Encounter: Payer: Self-pay | Admitting: Orthopedic Surgery

## 2016-11-06 ENCOUNTER — Telehealth: Payer: Self-pay | Admitting: Orthopedic Surgery

## 2016-11-06 NOTE — Telephone Encounter (Signed)
Dr Aline Brochure has advised me he will call with the results, I am sending this message as a reminder for him to call. Thank you.   Dr Aline Brochure, they are very anxious for a call, will you call when you can?

## 2016-11-06 NOTE — Telephone Encounter (Signed)
Patient's husband called to find out the results of the blood work his wife had done. I told him that someone would give them a call and let them know about the results if they are available.

## 2016-11-06 NOTE — Telephone Encounter (Signed)
nta

## 2016-11-06 NOTE — Telephone Encounter (Signed)
Yes I e called 3 times  Wed night  Thurs day  Their voicemail is full  Will call again tonight

## 2016-11-07 NOTE — Telephone Encounter (Signed)
Sent her a mychart message

## 2016-11-08 DIAGNOSIS — J069 Acute upper respiratory infection, unspecified: Secondary | ICD-10-CM | POA: Diagnosis not present

## 2016-11-11 DIAGNOSIS — E663 Overweight: Secondary | ICD-10-CM | POA: Diagnosis not present

## 2016-11-11 DIAGNOSIS — Z6829 Body mass index (BMI) 29.0-29.9, adult: Secondary | ICD-10-CM | POA: Diagnosis not present

## 2016-11-11 DIAGNOSIS — J209 Acute bronchitis, unspecified: Secondary | ICD-10-CM | POA: Diagnosis not present

## 2016-11-11 DIAGNOSIS — J069 Acute upper respiratory infection, unspecified: Secondary | ICD-10-CM | POA: Diagnosis not present

## 2016-11-14 DIAGNOSIS — Z01419 Encounter for gynecological examination (general) (routine) without abnormal findings: Secondary | ICD-10-CM | POA: Diagnosis not present

## 2016-11-25 ENCOUNTER — Telehealth: Payer: Self-pay | Admitting: Orthopedic Surgery

## 2016-11-25 DIAGNOSIS — R768 Other specified abnormal immunological findings in serum: Secondary | ICD-10-CM

## 2016-11-25 NOTE — Telephone Encounter (Signed)
Had left message, Victoria Stein did speak to her and provided number to call Dr Estanislado Pandy to check on the referral

## 2016-11-25 NOTE — Telephone Encounter (Signed)
She has +ANA low titer, were you going to refer to Rheumatology? Do you have a preference?

## 2016-11-25 NOTE — Telephone Encounter (Signed)
Victoria Stein called today stating she had spoken with Dr. Aline Brochure regarding her test results.  She was under the impression that she was going to be referred to another physician.  She states she hasnt heard from anyone.  Can you check on this please?  Thanks

## 2016-11-25 NOTE — Telephone Encounter (Signed)
Put in referral, have advised patient this process may take a couple more weeks. Provided number to call to inquire.

## 2016-11-25 NOTE — Telephone Encounter (Signed)
YES-DEVANSCHWAR

## 2016-12-01 ENCOUNTER — Telehealth: Payer: Self-pay | Admitting: Orthopedic Surgery

## 2016-12-01 NOTE — Telephone Encounter (Signed)
Patient called asking if she could be referred to [facility] Rheumatology. She stated she had called Devanschwar's office and was told if she was accepted as a patient it would be April before she could be seen. Patient gave the following numbers for [facility] Rheumatology Ph: [phone] and Fax: [phone]  Please advise

## 2016-12-01 NOTE — Telephone Encounter (Signed)
Called patient to advise referral has been sent to [facility] rheumatology.

## 2016-12-02 DIAGNOSIS — Z6829 Body mass index (BMI) 29.0-29.9, adult: Secondary | ICD-10-CM | POA: Diagnosis not present

## 2016-12-02 DIAGNOSIS — Z1389 Encounter for screening for other disorder: Secondary | ICD-10-CM | POA: Diagnosis not present

## 2016-12-02 DIAGNOSIS — F321 Major depressive disorder, single episode, moderate: Secondary | ICD-10-CM | POA: Diagnosis not present

## 2016-12-02 DIAGNOSIS — E663 Overweight: Secondary | ICD-10-CM | POA: Diagnosis not present

## 2017-01-09 DIAGNOSIS — D229 Melanocytic nevi, unspecified: Secondary | ICD-10-CM | POA: Diagnosis not present

## 2017-01-09 DIAGNOSIS — L709 Acne, unspecified: Secondary | ICD-10-CM | POA: Diagnosis not present

## 2017-01-22 DIAGNOSIS — R768 Other specified abnormal immunological findings in serum: Secondary | ICD-10-CM | POA: Diagnosis not present

## 2017-01-22 DIAGNOSIS — M25462 Effusion, left knee: Secondary | ICD-10-CM | POA: Diagnosis not present

## 2017-01-22 DIAGNOSIS — M2550 Pain in unspecified joint: Secondary | ICD-10-CM | POA: Diagnosis not present

## 2017-02-09 DIAGNOSIS — J069 Acute upper respiratory infection, unspecified: Secondary | ICD-10-CM | POA: Diagnosis not present

## 2017-02-09 DIAGNOSIS — M2550 Pain in unspecified joint: Secondary | ICD-10-CM | POA: Diagnosis not present

## 2017-02-16 DIAGNOSIS — Z0001 Encounter for general adult medical examination with abnormal findings: Secondary | ICD-10-CM | POA: Diagnosis not present

## 2017-02-16 DIAGNOSIS — Z87440 Personal history of urinary (tract) infections: Secondary | ICD-10-CM | POA: Diagnosis not present

## 2017-02-16 DIAGNOSIS — R300 Dysuria: Secondary | ICD-10-CM | POA: Diagnosis not present

## 2017-04-08 DIAGNOSIS — J019 Acute sinusitis, unspecified: Secondary | ICD-10-CM | POA: Diagnosis not present

## 2017-04-23 DIAGNOSIS — M25462 Effusion, left knee: Secondary | ICD-10-CM | POA: Diagnosis not present

## 2017-04-23 DIAGNOSIS — M2550 Pain in unspecified joint: Secondary | ICD-10-CM | POA: Diagnosis not present

## 2017-04-23 DIAGNOSIS — R768 Other specified abnormal immunological findings in serum: Secondary | ICD-10-CM | POA: Diagnosis not present

## 2017-05-18 DIAGNOSIS — D229 Melanocytic nevi, unspecified: Secondary | ICD-10-CM | POA: Diagnosis not present

## 2017-05-18 DIAGNOSIS — I780 Hereditary hemorrhagic telangiectasia: Secondary | ICD-10-CM | POA: Diagnosis not present

## 2017-05-25 DIAGNOSIS — Z1231 Encounter for screening mammogram for malignant neoplasm of breast: Secondary | ICD-10-CM | POA: Diagnosis not present

## 2017-05-27 DIAGNOSIS — M545 Low back pain: Secondary | ICD-10-CM | POA: Diagnosis not present

## 2017-05-27 DIAGNOSIS — Z6829 Body mass index (BMI) 29.0-29.9, adult: Secondary | ICD-10-CM | POA: Diagnosis not present

## 2017-05-27 DIAGNOSIS — K219 Gastro-esophageal reflux disease without esophagitis: Secondary | ICD-10-CM | POA: Diagnosis not present

## 2017-10-13 DIAGNOSIS — Z23 Encounter for immunization: Secondary | ICD-10-CM | POA: Diagnosis not present

## 2017-10-24 DIAGNOSIS — Z6829 Body mass index (BMI) 29.0-29.9, adult: Secondary | ICD-10-CM | POA: Diagnosis not present

## 2017-10-24 DIAGNOSIS — J029 Acute pharyngitis, unspecified: Secondary | ICD-10-CM | POA: Diagnosis not present

## 2017-10-24 DIAGNOSIS — J060 Acute laryngopharyngitis: Secondary | ICD-10-CM | POA: Diagnosis not present

## 2017-11-05 DIAGNOSIS — F419 Anxiety disorder, unspecified: Secondary | ICD-10-CM | POA: Diagnosis not present

## 2017-11-05 DIAGNOSIS — H9203 Otalgia, bilateral: Secondary | ICD-10-CM | POA: Diagnosis not present

## 2017-11-05 DIAGNOSIS — F39 Unspecified mood [affective] disorder: Secondary | ICD-10-CM | POA: Diagnosis not present

## 2017-11-06 ENCOUNTER — Other Ambulatory Visit: Payer: Self-pay

## 2017-11-06 DIAGNOSIS — D485 Neoplasm of uncertain behavior of skin: Secondary | ICD-10-CM | POA: Diagnosis not present

## 2017-11-24 DIAGNOSIS — Z01419 Encounter for gynecological examination (general) (routine) without abnormal findings: Secondary | ICD-10-CM | POA: Diagnosis not present

## 2018-05-12 DIAGNOSIS — E785 Hyperlipidemia, unspecified: Secondary | ICD-10-CM | POA: Diagnosis not present

## 2018-05-12 DIAGNOSIS — Z6830 Body mass index (BMI) 30.0-30.9, adult: Secondary | ICD-10-CM | POA: Diagnosis not present

## 2018-05-14 DIAGNOSIS — R944 Abnormal results of kidney function studies: Secondary | ICD-10-CM | POA: Diagnosis not present

## 2018-05-14 DIAGNOSIS — L720 Epidermal cyst: Secondary | ICD-10-CM | POA: Diagnosis not present

## 2018-05-14 DIAGNOSIS — Z0001 Encounter for general adult medical examination with abnormal findings: Secondary | ICD-10-CM | POA: Diagnosis not present

## 2018-05-14 DIAGNOSIS — E785 Hyperlipidemia, unspecified: Secondary | ICD-10-CM | POA: Diagnosis not present

## 2018-06-28 DIAGNOSIS — Z803 Family history of malignant neoplasm of breast: Secondary | ICD-10-CM | POA: Diagnosis not present

## 2018-06-28 DIAGNOSIS — Z1231 Encounter for screening mammogram for malignant neoplasm of breast: Secondary | ICD-10-CM | POA: Diagnosis not present

## 2018-07-05 ENCOUNTER — Other Ambulatory Visit: Payer: Self-pay

## 2018-07-05 DIAGNOSIS — D485 Neoplasm of uncertain behavior of skin: Secondary | ICD-10-CM | POA: Diagnosis not present

## 2018-08-14 DIAGNOSIS — L2389 Allergic contact dermatitis due to other agents: Secondary | ICD-10-CM | POA: Diagnosis not present

## 2018-11-02 DIAGNOSIS — J019 Acute sinusitis, unspecified: Secondary | ICD-10-CM | POA: Diagnosis not present

## 2018-11-02 DIAGNOSIS — M545 Low back pain: Secondary | ICD-10-CM | POA: Diagnosis not present

## 2018-11-02 DIAGNOSIS — K219 Gastro-esophageal reflux disease without esophagitis: Secondary | ICD-10-CM | POA: Diagnosis not present

## 2018-11-02 DIAGNOSIS — R944 Abnormal results of kidney function studies: Secondary | ICD-10-CM | POA: Diagnosis not present

## 2018-11-02 DIAGNOSIS — H9203 Otalgia, bilateral: Secondary | ICD-10-CM | POA: Diagnosis not present

## 2018-11-02 DIAGNOSIS — F419 Anxiety disorder, unspecified: Secondary | ICD-10-CM | POA: Diagnosis not present

## 2018-11-02 DIAGNOSIS — R0789 Other chest pain: Secondary | ICD-10-CM | POA: Diagnosis not present

## 2018-12-09 ENCOUNTER — Encounter: Payer: Self-pay | Admitting: *Deleted

## 2018-12-10 ENCOUNTER — Ambulatory Visit: Payer: BLUE CROSS/BLUE SHIELD | Admitting: Cardiology

## 2018-12-10 ENCOUNTER — Encounter: Payer: Self-pay | Admitting: *Deleted

## 2018-12-10 ENCOUNTER — Telehealth: Payer: Self-pay | Admitting: Cardiology

## 2018-12-10 ENCOUNTER — Encounter: Payer: Self-pay | Admitting: Cardiology

## 2018-12-10 ENCOUNTER — Other Ambulatory Visit: Payer: Self-pay

## 2018-12-10 VITALS — BP 112/79 | HR 76 | Ht 63.0 in | Wt 163.4 lb

## 2018-12-10 DIAGNOSIS — R0789 Other chest pain: Secondary | ICD-10-CM

## 2018-12-10 DIAGNOSIS — K219 Gastro-esophageal reflux disease without esophagitis: Secondary | ICD-10-CM | POA: Diagnosis not present

## 2018-12-10 NOTE — Telephone Encounter (Signed)
Pre-cert Verification for the following procedure    Exercise echo - scheduled for 12-20-2018 at [facility]

## 2018-12-10 NOTE — Patient Instructions (Addendum)
Medication Instructions:    Your physician recommends that you continue on your current medications as directed. Please refer to the Current Medication list given to you today.  Labwork:  You will need covid testing done 3 days before your stress echo. You will need to quarantine after your covid test until after you complete your stress echo. You will be notified when to have this done.  Testing/Procedures: Your physician has requested that you have a stress echocardiogram. For further information please visit [email or web address]. Please follow instruction sheet as given.  Follow-Up:  Your physician recommends that you schedule a follow-up appointment in: pending.   Any Other Special Instructions Will Be Listed Below (If Applicable).  If you need a refill on your cardiac medications before your next appointment, please call your pharmacy.

## 2018-12-10 NOTE — Progress Notes (Signed)
Cardiology Office Note  Date: 12/10/2018   ID: Victoria, Stein February 17, 1970, MRN [id number]  PCP:  Celene Squibb, MD  Cardiologist:  Rozann Lesches, MD Electrophysiologist:  None   Chief Complaint  Patient presents with   Chest Pain    History of Present Illness: Victoria Stein is a 48 y.o. female referred for cardiology consultation by Mr. Pearline Cables NP for the evaluation of chest pain.  Her father is a patient of mine.  She describes a history of of intermittent epigastric to lower chest discomfort and relative lack of stamina over the last few months.  Not always with exertion, sometimes with emotional stress.  She has been less active in general during the pandemic, was swimming regularly with her son last summer without the symptoms.  She does feel better having had a change in her PPI to Protonix raising the possibility that some of this could be related to reflux.  She does have a prior history of pulmonary embolus, although symptoms are different at this point.  She has no known history of hypertension, hyperlipidemia, or type 2 diabetes mellitus.  Records indicate previous normal cardiac CTA with calcium score of 0 as of 2009.  She has not undergone any subsequent ischemic testing.  I did review her recent ECG from October as outlined below.  Past Medical History:  Diagnosis Date   Chronic anxiety    Depression    DUB (dysfunctional uterine bleeding)    DVT of upper extremity (deep vein thrombosis) (HCC)    Associated with IV needle 2015   GERD (gastroesophageal reflux disease)    History of pulmonary embolus (PE)    Solitary subsegmental pulmonary embolism to the right upper lobe 2015   Microcytic anemia    Parotid mass    Benign; status post resection by Dr. Constance Holster    Past Surgical History:  Procedure Laterality Date   ABDOMINAL HYSTERECTOMY     ABLATION  12/2013   CHOLECYSTECTOMY N/A 03/03/2016   Procedure: LAPAROSCOPIC CHOLECYSTECTOMY;  Surgeon: Aviva Signs, MD;  Location: [facility];  Service: General;  Laterality: N/A;   PAROTID GLAND TUMOR EXCISION     WISDOM TOOTH EXTRACTION      Current Outpatient Medications  Medication Sig Dispense Refill   buPROPion (WELLBUTRIN XL) 300 MG 24 hr tablet Take 300 mg by mouth daily.     pantoprazole (PROTONIX) 40 MG tablet Take 40 mg by mouth daily.     No current facility-administered medications for this visit.    Allergies:  Amoxicillin   Social History: The patient  reports that she has never smoked. She has never used smokeless tobacco. She reports that she does not drink alcohol or use drugs.   Family History: The patient's family history includes Diabetes in her mother; Heart disease in her father; Heart failure in her father; Hypertension in her mother; Rheumatologic disease in her mother.   ROS:  Please see the history of present illness. Otherwise, complete review of systems is positive for none.  All other systems are reviewed and negative.   Physical Exam: VS:  BP 112/79 (BP Location: Right Arm, Cuff Size: Normal)    Pulse 76    Ht 5\' 3"  (1.6 m)    Wt 163 lb 6.4 oz (74.1 kg)    LMP 01/22/2015    SpO2 97%    BMI 28.95 kg/m , BMI Body mass index is 28.95 kg/m.  Wt Readings from Last 3 Encounters:  12/10/18 163  lb 6.4 oz (74.1 kg)  11/03/16 160 lb (72.6 kg)  09/15/16 160 lb (72.6 kg)    General: Patient appears comfortable at rest. HEENT: Conjunctiva and lids normal, wearing a mask. Neck: Supple, no elevated JVP or carotid bruits, no thyromegaly. Lungs: Clear to auscultation, nonlabored breathing at rest. Cardiac: Regular rate and rhythm, no S3 or significant systolic murmur. Abdomen: Soft, nontender,  bowel sounds present. Extremities: No pitting edema, distal pulses 2+. Skin: Warm and dry. Musculoskeletal: No kyphosis. Neuropsychiatric: Alert and oriented x3, affect grossly appropriate.  ECG:  An ECG dated 10/23/2018 was personally reviewed today and demonstrated:  Normal  sinus rhythm with lead motion artifact and nonspecific T wave changes.  Recent Labwork:  March 2020: BUN 17, creatinine 1.17  Other Studies Reviewed Today:  Echocardiogram 03/09/2013: Study Conclusions   - Left ventricle: The cavity size was normal. Wall thickness  was at the upper limits of normal. Systolic function was  normal. The estimated ejection fraction was in the range  of 55% to 60%. Wall motion was normal; there were no  regional wall motion abnormalities. Features are  consistent with a pseudonormal left ventricular filling  pattern, with concomitant abnormal relaxation and  increased filling pressure (grade 2 diastolic  dysfunction).  - Right ventricle: Systolic function was normal.  - Right atrium: Central venous pressure: 69mm Hg (est).  - Atrial septum: No defect or patent foramen ovale was  identified.  - Tricuspid valve: Mild regurgitation.  - Pulmonary arteries: PA peak pressure: 28mm Hg (S).  - Pericardium, extracardiac: A prominent pericardial fat pad  was present.  Impressions:   - Upper normal LV wall thickness with LVEF 55-60%, grade 2  diastolic dysfunction. Normal RV contraction. Mild  tricuspid regurgitation with normal PASP 23 mm mercury.  Epicardial fat pad without effusion.   Cardiac CTA with calcium score 01/05/2008: DETAILED FINDINGS:  Quality of Study: Excellent.  Left main: Moderate-length vessel that bifurcates into the LAD and left circumflex. There is no significant disease noted.  Left anterior descending: The LAD is a moderate-to-large sized vessel that extends around the apex. There are 2 moderate-sized diagonal branches. There is no significant disease noted.  Left circumflex: The left circumflex is a moderate-to-large sized vessel that has 2 obtuse marginal branches. OM 2 is a very long vessel that bifurcates distally. There is no significant disease noted.  Right coronary artery: The RCA is a dominant or  codominant vessel with small PDA and PO branches noted distally. There is no significant disease noted.  Cardiac anatomy:  Left ventricle size: Normal.  Left ventricular wall motion: Normal.  Left ventricular ejection fraction: Normal, greater than 55%.  Left atrium, right atrium, and RV size is within normal limits.  Pericardium: Normal thickness with no significant effusion.  Pulmonary artery: Normal caliber with no pathology noted.  Aorta: No significant calcification or plaquing noted. Normal size and caliber.  Pulmonary vein: No stenosis noted or other pathology.  Coronary calcium score: Agatston sore of 0.  IMPRESSION:  No significant coronary artery disease. Coronary calcium score is 0. Normal left ventricular and right ventricular size and systolic function. Overall, a normal study.   Assessment and Plan:  1.  Atypical chest pain in a 48 year old woman with a history of reflux on Protonix, previous DVT and pulmonary embolus and reassuring cardiac evaluation back in 2009 without interval testing.  She reports symptoms over the last few months as discussed above.  ECG reviewed and nonspecific without pathologic Q waves.  Plan to obtain an exercise echocardiogram.  2.  GERD, symptoms improved on Protonix.  Medication Adjustments/Labs and Tests Ordered: Current medicines are reviewed at length with the patient today.  Concerns regarding medicines are outlined above.   Tests Ordered: Orders Placed This Encounter  Procedures   ECHOCARDIOGRAM STRESS TEST    Medication Changes: No orders of the defined types were placed in this encounter.   Disposition:  Follow up test results.  Signed, Satira Sark, MD, [facility] 12/10/2018 11:07 AM    [facility], [place], [place] [address] Phone: [phone]; Fax: [phone]

## 2018-12-17 ENCOUNTER — Other Ambulatory Visit (HOSPITAL_COMMUNITY)
Admission: RE | Admit: 2018-12-17 | Discharge: 2018-12-17 | Disposition: A | Discharge status: Home / Self Care | Payer: BC Managed Care – PPO | Source: Ambulatory Visit | Attending: Cardiology | Admitting: Cardiology

## 2018-12-17 ENCOUNTER — Other Ambulatory Visit: Payer: Self-pay

## 2018-12-17 ENCOUNTER — Other Ambulatory Visit (HOSPITAL_COMMUNITY): Payer: BC Managed Care – PPO

## 2018-12-17 DIAGNOSIS — Z20828 Contact with and (suspected) exposure to other viral communicable diseases: Secondary | ICD-10-CM | POA: Insufficient documentation

## 2018-12-17 DIAGNOSIS — Z01812 Encounter for preprocedural laboratory examination: Secondary | ICD-10-CM | POA: Insufficient documentation

## 2018-12-17 LAB — SARS CORONAVIRUS 2 (TAT 6-24 HRS): SARS Coronavirus 2: NEGATIVE

## 2018-12-20 ENCOUNTER — Ambulatory Visit (HOSPITAL_COMMUNITY)
Admission: RE | Admit: 2018-12-20 | Discharge: 2018-12-20 | Disposition: A | Discharge status: Home / Self Care | Payer: BC Managed Care – PPO | Source: Ambulatory Visit | Attending: Internal Medicine | Admitting: Internal Medicine

## 2018-12-20 ENCOUNTER — Other Ambulatory Visit: Payer: Self-pay

## 2018-12-20 DIAGNOSIS — R0789 Other chest pain: Secondary | ICD-10-CM | POA: Insufficient documentation

## 2018-12-20 NOTE — Progress Notes (Signed)
*  PRELIMINARY RESULTS* Echocardiogram Echocardiogram Stress Test has been performed with limited Doppler and Color Doppler.  Victoria Stein 12/20/2018, 10:19 AM

## 2018-12-21 ENCOUNTER — Telehealth: Payer: Self-pay | Admitting: *Deleted

## 2018-12-21 NOTE — Telephone Encounter (Signed)
Patient informed. Copy sent to PCP °

## 2018-12-21 NOTE — Telephone Encounter (Signed)
-----   Message from Satira Sark, MD sent at 12/19/2018  9:35 AM EST ----- Results reviewed. Negative SARS-CoV-2 test prior to stress testing.

## 2018-12-21 NOTE — Telephone Encounter (Signed)
-----   Message from Satira Sark, MD sent at 12/20/2018  4:13 PM EST ----- Results reviewed.  Please let her know that the stress test was low risk, no ECG or echocardiographic evidence of obstructive CAD.  Would not anticipate further cardiac testing unless symptoms escalate.

## 2019-08-19 ENCOUNTER — Telehealth: Payer: Self-pay | Admitting: Nurse Practitioner

## 2019-08-19 NOTE — Telephone Encounter (Signed)
Called to Discuss with patient about Covid symptoms and the use of bamlanivimab, a monoclonal antibody infusion for those with mild to moderate Covid symptoms and at a high risk of hospitalization.     Pt does not meet updated criteria for MAB - advised to go to the ED if symptoms worsen

## 2019-08-19 NOTE — Telephone Encounter (Signed)
Called to Discuss with patient about Covid symptoms and the use of bamlanivimab, a monoclonal antibody infusion for those with mild to moderate Covid symptoms and at a high risk of hospitalization.     Pt needs to be qualified for this infusion at the [facility] infusion center due to co-morbid conditions and/or a member of an at-risk group.     Unable to reach pt - left voicemail for patient to return call.

## 2019-08-24 ENCOUNTER — Ambulatory Visit (HOSPITAL_COMMUNITY)
Admission: RE | Admit: 2019-08-24 | Discharge: 2019-08-24 | Disposition: A | Discharge status: Home / Self Care | Payer: 59 | Source: Ambulatory Visit | Attending: Pulmonary Disease | Admitting: Pulmonary Disease

## 2019-08-24 ENCOUNTER — Other Ambulatory Visit: Payer: Self-pay | Admitting: Nurse Practitioner

## 2019-08-24 DIAGNOSIS — E663 Overweight: Secondary | ICD-10-CM | POA: Diagnosis present

## 2019-08-24 DIAGNOSIS — U071 COVID-19: Secondary | ICD-10-CM | POA: Insufficient documentation

## 2019-08-24 MED ORDER — ACETAMINOPHEN 325 MG PO TABS
650.0000 mg | ORAL_TABLET | Freq: Once | ORAL | Status: AC
  Administered 2019-08-24: 650 mg via ORAL
  Filled 2019-08-24: qty 2

## 2019-08-24 MED ORDER — SODIUM CHLORIDE 0.9 % IV SOLN: INTRAVENOUS | Status: DC | PRN

## 2019-08-24 MED ORDER — DIPHENHYDRAMINE HCL 50 MG/ML IJ SOLN: 50.0000 mg | Freq: Once | INTRAMUSCULAR | Status: DC | PRN

## 2019-08-24 MED ORDER — EPINEPHRINE 0.3 MG/0.3ML IJ SOAJ: 0.3000 mg | Freq: Once | INTRAMUSCULAR | Status: DC | PRN

## 2019-08-24 MED ORDER — ALBUTEROL SULFATE HFA 108 (90 BASE) MCG/ACT IN AERS: 2.0000 | INHALATION_SPRAY | Freq: Once | RESPIRATORY_TRACT | Status: DC | PRN

## 2019-08-24 MED ORDER — SODIUM CHLORIDE 0.9 % IV SOLN
1200.0000 mg | Freq: Once | INTRAVENOUS | Status: AC
  Administered 2019-08-24: 1200 mg via INTRAVENOUS
  Filled 2019-08-24: qty 10

## 2019-08-24 MED ORDER — METHYLPREDNISOLONE SODIUM SUCC 125 MG IJ SOLR: 125.0000 mg | Freq: Once | INTRAMUSCULAR | Status: DC | PRN

## 2019-08-24 MED ORDER — FAMOTIDINE IN NACL 20-0.9 MG/50ML-% IV SOLN: 20.0000 mg | Freq: Once | INTRAVENOUS | Status: DC | PRN

## 2019-08-24 NOTE — Discharge Instructions (Signed)

## 2019-08-24 NOTE — Progress Notes (Signed)
  Diagnosis: COVID-19  Physician: Dr Patrick Wright  Procedure: Covid Infusion Clinic Med: casirivimab\imdevimab infusion - Provided patient with casirivimab\imdevimab fact sheet for patients, parents and caregivers prior to infusion.  Complications: No immediate complications noted.  Discharge: Discharged home   Victoria Stein 08/24/2019   

## 2019-08-24 NOTE — Progress Notes (Signed)
I connected by phone with Victoria Stein on 08/24/2019 at 11:25 AM to discuss the potential use of a new treatment for mild to moderate COVID-19 viral infection in non-hospitalized patients.  This patient is a 48 y.o. female that meets the FDA criteria for Emergency Use Authorization of COVID monoclonal antibody casirivimab/imdevimab.  Has a (+) direct SARS-CoV-2 viral test result  Has mild or moderate COVID-19   Is NOT hospitalized due to COVID-19  Is within 10 days of symptom onset  Has at least one of the high risk factor(s) for progression to severe COVID-19 and/or hospitalization as defined in EUA.  Specific high risk criteria : BMI > 25   I have spoken and communicated the following to the patient or parent/caregiver regarding COVID monoclonal antibody treatment:  1. FDA has authorized the emergency use for the treatment of mild to moderate COVID-19 in adults and pediatric patients with positive results of direct SARS-CoV-2 viral testing who are 33 years of age and older weighing at least 40 kg, and who are at high risk for progressing to severe COVID-19 and/or hospitalization.  2. The significant known and potential risks and benefits of COVID monoclonal antibody, and the extent to which such potential risks and benefits are unknown.  3. Information on available alternative treatments and the risks and benefits of those alternatives, including clinical trials.  4. Patients treated with COVID monoclonal antibody should continue to self-isolate and use infection control measures (e.g., wear mask, isolate, social distance, avoid sharing personal items, clean and disinfect "high touch" surfaces, and frequent handwashing) according to CDC guidelines.   5. The patient or parent/caregiver has the option to accept or refuse COVID monoclonal antibody treatment.  After reviewing this information with the patient, The patient agreed to proceed with receiving casirivimab\imdevimab infusion and  will be provided a copy of the Fact sheet prior to receiving the infusion. [name] 08/24/2019 11:25 AM

## 2019-08-26 ENCOUNTER — Emergency Department (HOSPITAL_COMMUNITY): Payer: 59

## 2019-08-26 ENCOUNTER — Other Ambulatory Visit: Payer: Self-pay

## 2019-08-26 ENCOUNTER — Emergency Department (HOSPITAL_COMMUNITY)
Admission: EM | Admit: 2019-08-26 | Discharge: 2019-08-26 | Disposition: A | Discharge status: Home / Self Care | Payer: 59 | Attending: Emergency Medicine | Admitting: Emergency Medicine

## 2019-08-26 ENCOUNTER — Encounter (HOSPITAL_COMMUNITY): Payer: Self-pay | Admitting: Emergency Medicine

## 2019-08-26 DIAGNOSIS — R5383 Other fatigue: Secondary | ICD-10-CM | POA: Insufficient documentation

## 2019-08-26 DIAGNOSIS — R0602 Shortness of breath: Secondary | ICD-10-CM

## 2019-08-26 DIAGNOSIS — Z8616 Personal history of COVID-19: Secondary | ICD-10-CM | POA: Diagnosis not present

## 2019-08-26 DIAGNOSIS — R0902 Hypoxemia: Secondary | ICD-10-CM | POA: Diagnosis not present

## 2019-08-26 DIAGNOSIS — R05 Cough: Secondary | ICD-10-CM | POA: Insufficient documentation

## 2019-08-26 DIAGNOSIS — R112 Nausea with vomiting, unspecified: Secondary | ICD-10-CM | POA: Insufficient documentation

## 2019-08-26 LAB — CBC WITH DIFFERENTIAL/PLATELET
Abs Immature Granulocytes: 0.04 10*3/uL (ref 0.00–0.07)
Basophils Absolute: 0 10*3/uL (ref 0.0–0.1)
Basophils Relative: 0 %
Eosinophils Absolute: 0 10*3/uL (ref 0.0–0.5)
Eosinophils Relative: 0 %
HCT: 43 % (ref 36.0–46.0)
Hemoglobin: 13.7 g/dL (ref 12.0–15.0)
Immature Granulocytes: 1 %
Lymphocytes Relative: 20 %
Lymphs Abs: 1 10*3/uL (ref 0.7–4.0)
MCH: 29.9 pg (ref 26.0–34.0)
MCHC: 31.9 g/dL (ref 30.0–36.0)
MCV: 93.9 fL (ref 80.0–100.0)
Monocytes Absolute: 0.3 10*3/uL (ref 0.1–1.0)
Monocytes Relative: 5 %
Neutro Abs: 3.6 10*3/uL (ref 1.7–7.7)
Neutrophils Relative %: 74 %
Platelets: 141 10*3/uL — ABNORMAL LOW (ref 150–400)
RBC: 4.58 MIL/uL (ref 3.87–5.11)
RDW: 12.9 % (ref 11.5–15.5)
WBC: 4.8 10*3/uL (ref 4.0–10.5)
nRBC: 0 % (ref 0.0–0.2)

## 2019-08-26 LAB — COMPREHENSIVE METABOLIC PANEL
ALT: 37 U/L (ref 0–44)
AST: 56 U/L — ABNORMAL HIGH (ref 15–41)
Albumin: 3.4 g/dL — ABNORMAL LOW (ref 3.5–5.0)
Alkaline Phosphatase: 128 U/L — ABNORMAL HIGH (ref 38–126)
Anion gap: 10 (ref 5–15)
BUN: 13 mg/dL (ref 6–20)
CO2: 28 mmol/L (ref 22–32)
Calcium: 8.8 mg/dL — ABNORMAL LOW (ref 8.9–10.3)
Chloride: 101 mmol/L (ref 98–111)
Creatinine, Ser: 0.86 mg/dL (ref 0.44–1.00)
GFR calc Af Amer: >60 mL/min (ref >60)
GFR calc non Af Amer: >60 mL/min (ref >60)
Glucose, Bld: 104 mg/dL — ABNORMAL HIGH (ref 70–99)
Potassium: 5 mmol/L (ref 3.5–5.1)
Sodium: 139 mmol/L (ref 135–145)
Total Bilirubin: 0.6 mg/dL (ref 0.3–1.2)
Total Protein: 7.3 g/dL (ref 6.5–8.1)

## 2019-08-26 MED ORDER — SODIUM CHLORIDE 0.9 % IV BOLUS
1000.0000 mL | Freq: Once | INTRAVENOUS | Status: AC
  Administered 2019-08-26: 1000 mL via INTRAVENOUS

## 2019-08-26 MED ORDER — IOHEXOL 350 MG/ML SOLN
75.0000 mL | Freq: Once | INTRAVENOUS | Status: AC | PRN
  Administered 2019-08-26: 75 mL via INTRAVENOUS

## 2019-08-26 MED ORDER — ONDANSETRON HCL 4 MG PO TABS: 4.0000 mg | ORAL_TABLET | Freq: Four times a day (QID) | ORAL | 0 refills | Status: DC

## 2019-08-26 NOTE — Discharge Instructions (Addendum)
You may take ibuprofen/Tylenol for body aches and fevers, drink plenty of fluids, over-the-counter cold and flu medications. You may also buy an over-the-counter pulse oximeter which can be found at your local pharmacy. Please follow up with your primary care doctor about the symptoms you were experiencing today. If your oxygen saturation drops below 90%, please make sure to return to the ER. Return to the ER for any worsening shortness of breath.     Person Under Monitoring Name: Victoria Stein  Location: [address] [place] [address]   Infection Prevention Recommendations for Individuals Confirmed to have, or Being Evaluated for, 2019 Novel Coronavirus (COVID-19) Infection Who Receive Care at Home  Individuals who are confirmed to have, or are being evaluated for, COVID-19 should follow the prevention steps below until a healthcare provider or local or state health department says they can return to normal activities.  Stay home except to get medical care You should restrict activities outside your home, except for getting medical care. Do not go to work, school, or public areas, and do not use public transportation or taxis.  Call ahead before visiting your doctor Before your medical appointment, call the healthcare provider and tell them that you have, or are being evaluated for, COVID-19 infection. This will help the healthcare provider's office take steps to keep other people from getting infected. Ask your healthcare provider to call the local or state health department.  Monitor your symptoms Seek prompt medical attention if your illness is worsening (e.g., difficulty breathing). Before going to your medical appointment, call the healthcare provider and tell them that you have, or are being evaluated for, COVID-19 infection. Ask your healthcare provider to call the local or state health department.  Wear a facemask You should wear a facemask that covers your nose  and mouth when you are in the same room with other people and when you visit a healthcare provider. People who live with or visit you should also wear a facemask while they are in the same room with you.  Separate yourself from other people in your home As much as possible, you should stay in a different room from other people in your home. Also, you should use a separate bathroom, if available.  Avoid sharing household items You should not share dishes, drinking glasses, cups, eating utensils, towels, bedding, or other items with other people in your home. After using these items, you should wash them thoroughly with soap and water.  Cover your coughs and sneezes Cover your mouth and nose with a tissue when you cough or sneeze, or you can cough or sneeze into your sleeve. Throw used tissues in a lined trash can, and immediately wash your hands with soap and water for at least 20 seconds or use an alcohol-based hand rub.  Wash your [facility] your hands often and thoroughly with soap and water for at least 20 seconds. You can use an alcohol-based hand sanitizer if soap and water are not available and if your hands are not visibly dirty. Avoid touching your eyes, nose, and mouth with unwashed hands.   Prevention Steps for Caregivers and Household Members of Individuals Confirmed to have, or Being Evaluated for, COVID-19 Infection Being Cared for in the Home  If you live with, or provide care at home for, a person confirmed to have, or being evaluated for, COVID-19 infection please follow these guidelines to prevent infection:  Follow healthcare provider's instructions Make sure that you understand and can help  the patient follow any healthcare provider instructions for all care.  Provide for the patient's basic needs You should help the patient with basic needs in the home and provide support for getting groceries, prescriptions, and other personal needs.  Monitor the patient's  symptoms If they are getting sicker, call his or her medical provider and tell them that the patient has, or is being evaluated for, COVID-19 infection. This will help the healthcare provider's office take steps to keep other people from getting infected. Ask the healthcare provider to call the local or state health department.  Limit the number of people who have contact with the patient If possible, have only one caregiver for the patient. Other household members should stay in another home or place of residence. If this is not possible, they should stay in another room, or be separated from the patient as much as possible. Use a separate bathroom, if available. Restrict visitors who do not have an essential need to be in the home.  Keep older adults, very young children, and other sick people away from the patient Keep older adults, very young children, and those who have compromised immune systems or chronic health conditions away from the patient. This includes people with chronic heart, lung, or kidney conditions, diabetes, and cancer.  Ensure good ventilation Make sure that shared spaces in the home have good air flow, such as from an air conditioner or an opened window, weather permitting.  Wash your hands often Wash your hands often and thoroughly with soap and water for at least 20 seconds. You can use an alcohol based hand sanitizer if soap and water are not available and if your hands are not visibly dirty. Avoid touching your eyes, nose, and mouth with unwashed hands. Use disposable paper towels to dry your hands. If not available, use dedicated cloth towels and replace them when they become wet.  Wear a facemask and gloves Wear a disposable facemask at all times in the room and gloves when you touch or have contact with the patient's blood, body fluids, and/or secretions or excretions, such as sweat, saliva, sputum, nasal mucus, vomit, urine, or feces.  Ensure the mask fits over  your nose and mouth tightly, and do not touch it during use. Throw out disposable facemasks and gloves after using them. Do not reuse. Wash your hands immediately after removing your facemask and gloves. If your personal clothing becomes contaminated, carefully remove clothing and launder. Wash your hands after handling contaminated clothing. Place all used disposable facemasks, gloves, and other waste in a lined container before disposing them with other household waste. Remove gloves and wash your hands immediately after handling these items.  Do not share dishes, glasses, or other household items with the patient Avoid sharing household items. You should not share dishes, drinking glasses, cups, eating utensils, towels, bedding, or other items with a patient who is confirmed to have, or being evaluated for, COVID-19 infection. After the person uses these items, you should wash them thoroughly with soap and water.  Wash laundry thoroughly Immediately remove and wash clothes or bedding that have blood, body fluids, and/or secretions or excretions, such as sweat, saliva, sputum, nasal mucus, vomit, urine, or feces, on them. Wear gloves when handling laundry from the patient. Read and follow directions on labels of laundry or clothing items and detergent. In general, wash and dry with the warmest temperatures recommended on the label.  Clean all areas the individual has used often Clean all touchable surfaces,  such as counters, tabletops, doorknobs, bathroom fixtures, toilets, phones, keyboards, tablets, and bedside tables, every day. Also, clean any surfaces that may have blood, body fluids, and/or secretions or excretions on them. Wear gloves when cleaning surfaces the patient has come in contact with. Use a diluted bleach solution (e.g., dilute bleach with 1 part bleach and 10 parts water) or a household disinfectant with a label that says EPA-registered for coronaviruses. To make a bleach  solution at home, add 1 tablespoon of bleach to 1 quart (4 cups) of water. For a larger supply, add  cup of bleach to 1 gallon (16 cups) of water. Read labels of cleaning products and follow recommendations provided on product labels. Labels contain instructions for safe and effective use of the cleaning product including precautions you should take when applying the product, such as wearing gloves or eye protection and making sure you have good ventilation during use of the product. Remove gloves and wash hands immediately after cleaning.  Monitor yourself for signs and symptoms of illness Caregivers and household members are considered close contacts, should monitor their health, and will be asked to limit movement outside of the home to the extent possible. Follow the monitoring steps for close contacts listed on the symptom monitoring form.   ? If you have additional questions, contact your local health department or call the epidemiologist on call at [phone] (available 24/7). ? This guidance is subject to change. For the most up-to-date guidance from [facility], please refer to their website: [email or web address]

## 2019-08-26 NOTE — ED Triage Notes (Addendum)
covid pos since last thurs   antibody tx at [facility]   Here for eval

## 2019-08-26 NOTE — ED Notes (Signed)
To CT

## 2019-08-26 NOTE — ED Notes (Signed)
In to staqrt IV for CT/angio  Pt distressed when after showing IV preference to R hand, and being informed that IV had to be high accused N of being rude and asking for another nurse to provide care   Beth, RN in to start IV Anderson Malta, RN informed and asked to move pt to zone A

## 2019-08-26 NOTE — ED Provider Notes (Signed)
[facility] EMERGENCY DEPARTMENT Provider Note   CSN: [id number] Arrival date & time: 08/26/19  3710     History Chief Complaint  Patient presents with  . Shortness of Breath    covid positive    Victoria Stein is a 49 y.o. female.  HPI 49 year old female with a history of chronic anxiety, depression, DVT in the upper extremity, PE in 2015, anemia, parotid mass presents to the ER with an episode of shortness of breath and hypoxia at home after being diagnosed with Covid on Thursday 8/12.  Patient states that she has started working at a [employer] with a did not require masks and since she has stopped wearing masks.  She is not vaccinated, stating that her physician told her to not get the vaccine secondary to her history of PE and DVT.  She is not on anticoagulation at this time.  Received 6 months of Lovenox shots but not on anything currently.  She states that she has been having some worsening nausea, vomiting, and a cough which developed several days ago.  She stated this morning she woke up and had an episode where she felt like she could not breathe, checked her pulse oximeter at home and it varied between 60 to 70%.  Patient is tearful as she is providing her history.  She states that she received a  COVID infusion on Wednesday.  She endorses some fevers, but no loss of taste or smell.  Denies any chest pain, abdominal pain, back pain.    Past Medical History:  Diagnosis Date  . Chronic anxiety   . Depression   . DUB (dysfunctional uterine bleeding)   . DVT of upper extremity (deep vein thrombosis) ([place])    Associated with IV needle 2015  . GERD (gastroesophageal reflux disease)   . History of pulmonary embolus (PE)    Solitary subsegmental pulmonary embolism to the right upper lobe 2015  . Microcytic anemia   . Parotid mass    Benign; status post resection by Dr. Constance Holster    Patient Active Problem List   Diagnosis Date Noted  . Cholecystitis 03/02/2016  .  Abnormal LFTs 06/27/2013  . GERD (gastroesophageal reflux disease) 06/23/2013  . Diastolic dysfunction 62/69/4854  . Iron deficiency anemia due to chronic blood loss 03/09/2013  . Acute pulmonary embolism ([place]) 03/08/2013  . DVT of upper extremity (deep vein thrombosis) ([place]) 03/08/2013  . Acute anxiety 03/08/2013    Past Surgical History:  Procedure Laterality Date  . ABDOMINAL HYSTERECTOMY    . ABLATION  12/2013  . CHOLECYSTECTOMY N/A 03/03/2016   Procedure: LAPAROSCOPIC CHOLECYSTECTOMY;  Surgeon: Aviva Signs, MD;  Location: [facility];  Service: General;  Laterality: N/A;  . PAROTID GLAND TUMOR EXCISION    . WISDOM TOOTH EXTRACTION       OB History   No obstetric history on file.     Family History  Problem Relation Age of Onset  . Diabetes Mother   . Rheumatologic disease Mother   . Hypertension Mother   . Heart disease Father   . Heart failure Father     Social History   Tobacco Use  . Smoking status: Never Smoker  . Smokeless tobacco: Never Used  . Tobacco comment: Never smoked  Substance Use Topics  . Alcohol use: No  . Drug use: No    Home Medications Prior to Admission medications   Medication Sig Start Date End Date Taking? Authorizing Provider  buPROPion (WELLBUTRIN XL) 300 MG 24  hr tablet Take 300 mg by mouth daily. 08/26/18   [provider]  ondansetron (ZOFRAN) 4 MG tablet Take 1 tablet (4 mg total) by mouth every 6 (six) hours. 08/26/19   Garald Balding, PA-C  pantoprazole (PROTONIX) 40 MG tablet Take 40 mg by mouth daily. 11/25/18   [provider]    Allergies    Amoxicillin  Review of Systems   Review of Systems  Constitutional: Positive for activity change, appetite change, fatigue and fever. Negative for chills.  HENT: Negative for ear pain and sore throat.   Eyes: Negative for pain and visual disturbance.  Respiratory: Positive for cough and shortness of breath.   Cardiovascular: Negative for chest pain and palpitations.    Gastrointestinal: Positive for nausea and vomiting. Negative for abdominal pain.  Genitourinary: Negative for dysuria and hematuria.  Musculoskeletal: Negative for arthralgias and back pain.  Skin: Negative for color change and rash.  Neurological: Negative for seizures and syncope.  All other systems reviewed and are negative.   Physical Exam Updated Vital Signs BP 130/89 (BP Location: Right Arm)   Pulse 75   Temp 98.3 F (36.8 C) (Oral)   Resp 18   Ht 5\' 3"  (1.6 m)   Wt 73.9 kg   LMP 01/22/2015   SpO2 96%   BMI 28.87 kg/m   Physical Exam Vitals and nursing note reviewed.  Constitutional:      General: She is not in acute distress.    Appearance: She is well-developed. She is not ill-appearing, toxic-appearing or diaphoretic.     Comments: Tearful, in NAD  HENT:     Head: Normocephalic and atraumatic.  Eyes:     Conjunctiva/sclera: Conjunctivae normal.  Cardiovascular:     Rate and Rhythm: Normal rate and regular rhythm.     Pulses: No decreased pulses.     Heart sounds: No murmur heard.   Pulmonary:     Effort: Pulmonary effort is normal. No respiratory distress.     Breath sounds: Decreased breath sounds present.     Comments: Speaking in full sentences without increased WOB, no accessory muscle usage  Chest:     Chest wall: No tenderness.  Abdominal:     Palpations: Abdomen is soft.     Tenderness: There is no abdominal tenderness.  Musculoskeletal:        General: Normal range of motion.     Cervical back: Normal range of motion and neck supple.     Right lower leg: No tenderness. No edema.     Left lower leg: No tenderness. No edema.  Skin:    General: Skin is warm and dry.     Capillary Refill: Capillary refill takes less than 2 seconds.  Neurological:     General: No focal deficit present.     Mental Status: She is alert.  Psychiatric:        Mood and Affect: Mood normal.        Behavior: Behavior normal.     ED Results / Procedures /  Treatments   Labs (all labs ordered are listed, but only abnormal results are displayed) Labs Reviewed  CBC WITH DIFFERENTIAL/PLATELET - Abnormal; Notable for the following components:      Result Value   Platelets 141 (*)    All other components within normal limits  COMPREHENSIVE METABOLIC PANEL - Abnormal; Notable for the following components:   Glucose, Bld 104 (*)    Calcium 8.8 (*)    Albumin 3.4 (*)  AST 56 (*)    Alkaline Phosphatase 128 (*)    All other components within normal limits    EKG None  Radiology CT Angio Chest PE W and/or Wo Contrast  Result Date: 08/26/2019 CLINICAL DATA:  Shortness of breath.  COVID-19 positive EXAM: CT ANGIOGRAPHY CHEST WITH CONTRAST TECHNIQUE: Multidetector CT imaging of the chest was performed using the standard protocol during bolus administration of intravenous contrast. Multiplanar CT image reconstructions and MIPs were obtained to evaluate the vascular anatomy. CONTRAST:  10mL OMNIPAQUE IOHEXOL 350 MG/ML SOLN COMPARISON:  Chest radiograph August 26, 2019; CT angiogram chest March 02, 2016 FINDINGS: Cardiovascular: There is no demonstrable pulmonary embolus. No thoracic aortic aneurysm or dissection. Visualized great vessels appear normal. No pericardial effusion or pericardial thickening. Mediastinum/Nodes: Thyroid appears normal. No evident adenopathy. There is a small hiatal hernia. Lungs/Pleura: There is airspace opacity throughout the lungs bilaterally with a lower lobe predominance overall. Most lobes in segments except for apical regions show patchy airspace opacity. No appreciable consolidation evident. No pleural effusions are appreciable. Upper Abdomen: There is an 8 mm cyst in the anterior segment of the right lobe of the liver. Gallbladder absent. Visualized upper abdominal structures otherwise appear unremarkable. Musculoskeletal: There are no blastic or lytic bone lesions. No chest wall lesions. Review of the MIP images confirms  the above findings. IMPRESSION: 1. No evident pulmonary embolus. No thoracic aortic aneurysm or dissection. 2. Multifocal pneumonia without appreciable consolidation. Suspect atypical organism pneumonia. No pleural effusions. 3.  No evident adenopathy. 4.  Small hiatal hernia. 5.  Gallbladder absent. Electronically Signed   By: Lowella Grip III M.D.   On: 08/26/2019 14:49   DG Chest Portable 1 View  Result Date: 08/26/2019 CLINICAL DATA:  COVID positive, shortness of breath with cough. EXAM: PORTABLE CHEST 1 VIEW COMPARISON:  March 26, 2013 FINDINGS: New bilateral patchy airspace opacities with a peripheral and basilar predominance. No sizable pleural effusion. No discernible pneumothorax. The heart size and mediastinal contours are within normal limits and accentuated by low lung volumes. The visualized skeletal structures are unremarkable. IMPRESSION: New bilateral patchy airspace opacities with a peripheral and basilar predominance, concerning for multifocal COVID pneumonia given the clinical history. Electronically Signed   By: Margaretha Sheffield MD   On: 08/26/2019 12:55    Procedures Procedures (including critical care time)  Medications Ordered in ED Medications  sodium chloride 0.9 % bolus 1,000 mL (1,000 mLs Intravenous New Bag/Given (Non-Interop) 08/26/19 1416)  iohexol (OMNIPAQUE) 350 MG/ML injection 75 mL (75 mLs Intravenous Contrast Given 08/26/19 1435)    ED Course  I have reviewed the triage vital signs and the nursing notes.  Pertinent labs & imaging results that were available during my care of the patient were reviewed by me and considered in my medical decision making (see chart for details).    MDM Rules/Calculators/A&P                         49 year old female with an episode of shortness of breath after testing positive for Covid over a week ago On presentation, she is alert, oriented nontoxic-appearing, no acute distress, though tearful when talking about testing  positive for Covid.  She is visibly anxious. Vital signs overall reassuring, she is afebrile, at triage O2 was at 94%, however while in the room she consistently remained at 95% and above.  She is not tachycardic. Physical exam with slightly decreased breath sounds, but no significant rales or rhonchi.  Ddx: COVID-19 pneumonia, PE, pericardial effusion,   LABS:  I personally reviewed and interpreted her lab work  CMP with no significant electrode abnormalities, normal renal function.  Mildly elevated AST at 56, normal ALT.  This is likely in the setting of COVID-19. CBC without leukocytosis, normal hemoglobin.  IMAGING:  Chest x-ray with new bilateral patchy airspace opacities likely secondary to multifocal COVID pneumonia CT angio was ordered given the patient's history of PE and no anticoagulation.  This was negative for pulmonary embolism, confirms multifocal Covid pneumonia  EKG: Normal sinus rhythm  MDM: Patient was ambulated around the ED with no evidence of desaturations.  There is no evidence of respiratory distress, and PE has been ruled out with a CTA.  Stable for discharge with strict return precautions. Pt received IV fluids and tolerated PO ginger ale well.   DISPO: Patient is stable for discharge with strict return precautions which I discussed with the patient. Encouraged supportive measures.  Zofran script provided. She voices understanding and is agreeable.  Encouraged her to follow-up with her PCP.  Discussed the case with Dr. Alvino Chapel who is agreeable to the above plan and disposition.  LETTI TOWELL was evaluated in Emergency Department on 08/26/2019 for the symptoms described in the history of present illness. She was evaluated in the context of the global COVID-19 pandemic, which necessitated consideration that the patient might be at risk for infection with the SARS-CoV-2 virus that causes COVID-19. Institutional protocols and algorithms that pertain to the evaluation of  patients at risk for COVID-19 are in a state of rapid change based on information released by regulatory bodies including the CDC and federal and state organizations. These policies and algorithms were followed during the patient's care in the ED.  Final Clinical Impression(s) / ED Diagnoses Final diagnoses:  SOB (shortness of breath)    Rx / DC Orders ED Discharge Orders         Ordered    ondansetron (ZOFRAN) 4 MG tablet  Every 6 hours        08/26/19 1515           Lyndel Safe 08/26/19 1515    Davonna Belling, MD 08/26/19 [phone]

## 2020-04-18 ENCOUNTER — Ambulatory Visit (INDEPENDENT_AMBULATORY_CARE_PROVIDER_SITE_OTHER): Payer: 59 | Admitting: Dermatology

## 2020-04-18 ENCOUNTER — Other Ambulatory Visit: Payer: Self-pay

## 2020-04-18 ENCOUNTER — Encounter: Payer: Self-pay | Admitting: Dermatology

## 2020-04-18 DIAGNOSIS — D1801 Hemangioma of skin and subcutaneous tissue: Secondary | ICD-10-CM | POA: Diagnosis not present

## 2020-04-18 DIAGNOSIS — Z1283 Encounter for screening for malignant neoplasm of skin: Secondary | ICD-10-CM | POA: Diagnosis not present

## 2020-04-18 DIAGNOSIS — D0462 Carcinoma in situ of skin of left upper limb, including shoulder: Secondary | ICD-10-CM | POA: Diagnosis not present

## 2020-04-18 DIAGNOSIS — L570 Actinic keratosis: Secondary | ICD-10-CM | POA: Diagnosis not present

## 2020-04-18 DIAGNOSIS — D485 Neoplasm of uncertain behavior of skin: Secondary | ICD-10-CM

## 2020-04-18 NOTE — Patient Instructions (Signed)

## 2020-04-25 ENCOUNTER — Telehealth: Payer: Self-pay | Admitting: Dermatology

## 2020-04-25 NOTE — Telephone Encounter (Signed)
Patients path is not back told her to call back in a few days.

## 2020-04-25 NOTE — Telephone Encounter (Signed)
Results, ST 

## 2020-04-26 ENCOUNTER — Telehealth: Payer: Self-pay | Admitting: Dermatology

## 2020-04-26 NOTE — Telephone Encounter (Signed)
PATH TO PATIENT 306 MONTH MADE PRN.

## 2020-04-26 NOTE — Telephone Encounter (Signed)
Results. Michela Pitcher they came in on my chart yesterday.

## 2020-04-28 ENCOUNTER — Encounter: Payer: Self-pay | Admitting: Dermatology

## 2020-04-28 NOTE — Progress Notes (Signed)
   Follow-Up Visit   Subjective  Victoria Stein is a 50 y.o. female who presents for the following: Warts (Patient has spot on left wrist that she thinks is a wart- spot came on wrist in September and she has been treating with over the counter- compound W- falls off and comes right back).  Growth on top of left hand plus would like general skin check Location:  Duration:  Quality:  Associated Signs/Symptoms: Modifying Factors:  Severity:  Timing: Context:   Objective  Well appearing patient in no apparent distress; mood and affect are within normal limits. Objective  Waist up: Waist up skin check.  No atypical pigmented lesions.  1 possible nonmelanoma skin cancer on left hand will be biopsied and treated.  Objective  Mid Back: Red 1 to 2 mm smooth papules chest, neck  Objective  Left Dorsal Hand: Verrucous thick white crust on top of 48mm waxy pink scale, rule out exophytic SCCA     Objective  Right Dorsal Hand: Gritty 7 mm pink crust    All sun exposed areas plus back examined.  Plus upper chest.   Assessment & Plan    Screening exam for skin cancer Waist up  Yearly skin check.  Encouraged to self examine twice annually.  Continued ultraviolet protection.  Cherry angioma Mid Back  Intervention not necessary  Neoplasm of uncertain behavior of skin Left Dorsal Hand  Skin / nail biopsy Type of biopsy: tangential   Informed consent: discussed and consent obtained   Timeout: patient name, date of birth, surgical site, and procedure verified   Procedure prep:  Patient was prepped and draped in usual sterile fashion (Non sterile) Prep type:  Chlorhexidine Anesthesia: the lesion was anesthetized in a standard fashion   Anesthetic:  1% lidocaine w/ epinephrine 1-100,000 local infiltration Instrument used: flexible razor blade   Outcome: patient tolerated procedure well   Post-procedure details: wound care instructions given    Destruction of  lesion Complexity: simple   Destruction method: electrodesiccation and curettage   Informed consent: discussed and consent obtained   Timeout:  patient name, date of birth, surgical site, and procedure verified Anesthesia: the lesion was anesthetized in a standard fashion   Anesthetic:  1% lidocaine w/ epinephrine 1-100,000 local infiltration Curettage performed in three different directions: Yes   Curettage cycles:  3 Lesion length (cm):  0.7 Lesion width (cm):  0.7 Margin per side (cm):  0 Final wound size (cm):  0.7 Hemostasis achieved with:  aluminum chloride and electrodesiccation Outcome: patient tolerated procedure well with no complications   Post-procedure details: wound care instructions given    Specimen 1 - Surgical pathology Differential Diagnosis: wart,  Cautery Check Margins: No  After shave biopsy the base was treated with triple curettage plus cautery.  Solar keratosis Right Dorsal Hand  Destruction of lesion - Right Dorsal Hand Complexity: simple   Destruction method: cryotherapy   Informed consent: discussed and consent obtained   Timeout:  patient name, date of birth, surgical site, and procedure verified Lesion destroyed using liquid nitrogen: Yes   Cryotherapy cycles:  4 Outcome: patient tolerated procedure well with no complications   Post-procedure details: wound care instructions given        I, Lavonna Monarch, MD, have reviewed all documentation for this visit.  The documentation on 04/28/20 for the exam, diagnosis, procedures, and orders are all accurate and complete.

## 2020-05-16 ENCOUNTER — Ambulatory Visit: Payer: 59 | Admitting: Dermatology

## 2020-06-06 ENCOUNTER — Encounter (HOSPITAL_COMMUNITY): Payer: Self-pay | Admitting: Hematology and Oncology

## 2020-06-19 ENCOUNTER — Other Ambulatory Visit (HOSPITAL_COMMUNITY): Payer: Self-pay | Admitting: Internal Medicine

## 2020-06-19 ENCOUNTER — Other Ambulatory Visit: Payer: Self-pay | Admitting: Internal Medicine

## 2020-06-19 DIAGNOSIS — R221 Localized swelling, mass and lump, neck: Secondary | ICD-10-CM

## 2020-06-27 ENCOUNTER — Ambulatory Visit (HOSPITAL_COMMUNITY)
Admission: RE | Admit: 2020-06-27 | Discharge: 2020-06-27 | Disposition: A | Discharge status: Home / Self Care | Payer: 59 | Source: Ambulatory Visit | Attending: Internal Medicine | Admitting: Internal Medicine

## 2020-06-27 ENCOUNTER — Other Ambulatory Visit: Payer: Self-pay

## 2020-06-27 DIAGNOSIS — R221 Localized swelling, mass and lump, neck: Secondary | ICD-10-CM | POA: Insufficient documentation

## 2020-07-17 ENCOUNTER — Other Ambulatory Visit (HOSPITAL_COMMUNITY): Payer: Self-pay | Admitting: Family Medicine

## 2020-07-17 DIAGNOSIS — R590 Localized enlarged lymph nodes: Secondary | ICD-10-CM

## 2020-07-24 ENCOUNTER — Other Ambulatory Visit: Payer: Self-pay

## 2020-07-24 ENCOUNTER — Ambulatory Visit (HOSPITAL_COMMUNITY)
Admission: RE | Admit: 2020-07-24 | Discharge: 2020-07-24 | Disposition: A | Discharge status: Home / Self Care | Payer: 59 | Source: Ambulatory Visit | Attending: Family Medicine | Admitting: Family Medicine

## 2020-07-24 DIAGNOSIS — R590 Localized enlarged lymph nodes: Secondary | ICD-10-CM | POA: Insufficient documentation

## 2020-07-30 ENCOUNTER — Ambulatory Visit (HOSPITAL_COMMUNITY): Payer: 59

## 2020-09-17 ENCOUNTER — Other Ambulatory Visit: Payer: Self-pay

## 2020-09-17 ENCOUNTER — Encounter (HOSPITAL_COMMUNITY): Payer: Self-pay | Admitting: Hematology and Oncology

## 2020-09-17 ENCOUNTER — Ambulatory Visit (INDEPENDENT_AMBULATORY_CARE_PROVIDER_SITE_OTHER): Payer: 59 | Admitting: Dermatology

## 2020-09-17 DIAGNOSIS — B078 Other viral warts: Secondary | ICD-10-CM

## 2020-09-17 DIAGNOSIS — Z86007 Personal history of in-situ neoplasm of skin: Secondary | ICD-10-CM

## 2020-09-24 ENCOUNTER — Encounter: Payer: Self-pay | Admitting: Dermatology

## 2020-09-24 NOTE — Progress Notes (Signed)
   Follow-Up Visit   Subjective  Victoria Stein is a 50 y.o. female who presents for the following: Follow-up (Follow up on left hand CIS. Doing well treated after biopsy. Check left arm scaly lesion. Not going away. /).  Rough spot on left arm, recheck site of skin cancer. Location:  Duration:  Quality:  Associated Signs/Symptoms: Modifying Factors:  Severity:  Timing: Context:   Objective  Well appearing patient in no apparent distress; mood and affect are within normal limits. Left Hand - Posterior Modest scar, no sign residual carcinoma.  Left Forearm - Posterior 2 mm pink verrucous papule.  Discussed in some detail her lack of an effective antiviral making every alternative therapy a little bit illogical.  The lesion does bother patient so we will try freezing.    A focused examination was performed including head, neck, arms. Relevant physical exam findings are noted in the Assessment and Plan.   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  The documentation on 09/24/20 for the exam, diagnosis, procedures, and orders are all accurate and complete.

## 2021-01-03 ENCOUNTER — Encounter (HOSPITAL_COMMUNITY): Payer: Self-pay | Admitting: Hematology and Oncology

## 2021-04-03 ENCOUNTER — Other Ambulatory Visit: Payer: Self-pay | Admitting: Family Medicine

## 2021-04-03 ENCOUNTER — Other Ambulatory Visit (HOSPITAL_COMMUNITY): Payer: Self-pay | Admitting: Family Medicine

## 2021-04-03 DIAGNOSIS — R221 Localized swelling, mass and lump, neck: Secondary | ICD-10-CM

## 2021-04-16 ENCOUNTER — Encounter (HOSPITAL_COMMUNITY): Payer: Self-pay | Admitting: Hematology and Oncology

## 2021-04-18 ENCOUNTER — Other Ambulatory Visit (HOSPITAL_COMMUNITY): Payer: Self-pay | Admitting: Family Medicine

## 2021-04-18 ENCOUNTER — Encounter (HOSPITAL_COMMUNITY): Payer: Self-pay

## 2021-04-18 ENCOUNTER — Other Ambulatory Visit: Payer: Self-pay | Admitting: Family Medicine

## 2021-04-18 ENCOUNTER — Ambulatory Visit (HOSPITAL_COMMUNITY)
Admission: RE | Admit: 2021-04-18 | Discharge: 2021-04-18 | Disposition: A | Discharge status: Home / Self Care | Payer: 59 | Source: Ambulatory Visit | Attending: Family Medicine | Admitting: Family Medicine

## 2021-04-18 DIAGNOSIS — R221 Localized swelling, mass and lump, neck: Secondary | ICD-10-CM

## 2021-04-18 DIAGNOSIS — K118 Other diseases of salivary glands: Secondary | ICD-10-CM

## 2021-05-07 ENCOUNTER — Other Ambulatory Visit (HOSPITAL_COMMUNITY): Payer: Self-pay | Admitting: Family Medicine

## 2021-05-07 ENCOUNTER — Ambulatory Visit (HOSPITAL_COMMUNITY)
Admission: RE | Admit: 2021-05-07 | Discharge: 2021-05-07 | Disposition: A | Discharge status: Home / Self Care | Payer: 59 | Source: Ambulatory Visit | Attending: Family Medicine | Admitting: Family Medicine

## 2021-05-07 DIAGNOSIS — K118 Other diseases of salivary glands: Secondary | ICD-10-CM | POA: Insufficient documentation

## 2021-05-07 MED ORDER — GADOBUTROL 1 MMOL/ML IV SOLN
7.0000 mL | Freq: Once | INTRAVENOUS | Status: AC | PRN
  Administered 2021-05-07: 7 mL via INTRAVENOUS

## 2021-07-15 ENCOUNTER — Encounter (HOSPITAL_COMMUNITY): Payer: Self-pay | Admitting: Hematology and Oncology

## 2021-08-29 ENCOUNTER — Encounter (HOSPITAL_COMMUNITY): Payer: Self-pay | Admitting: Hematology and Oncology

## 2021-08-29 ENCOUNTER — Emergency Department (HOSPITAL_COMMUNITY): Payer: 59

## 2021-08-29 ENCOUNTER — Encounter (HOSPITAL_COMMUNITY): Payer: Self-pay | Admitting: Emergency Medicine

## 2021-08-29 ENCOUNTER — Emergency Department (HOSPITAL_COMMUNITY)
Admission: EM | Admit: 2021-08-29 | Discharge: 2021-08-29 | Disposition: A | Discharge status: Home / Self Care | Payer: 59 | Attending: Emergency Medicine | Admitting: Emergency Medicine

## 2021-08-29 ENCOUNTER — Other Ambulatory Visit: Payer: Self-pay

## 2021-08-29 ENCOUNTER — Ambulatory Visit
Admission: RE | Admit: 2021-08-29 | Discharge: 2021-08-29 | Disposition: A | Discharge status: Home / Self Care | Payer: 59 | Source: Ambulatory Visit | Attending: Internal Medicine | Admitting: Internal Medicine

## 2021-08-29 VITALS — BP 110/76 | HR 80 | Temp 98.1°F | Resp 18

## 2021-08-29 DIAGNOSIS — Z7982 Long term (current) use of aspirin: Secondary | ICD-10-CM | POA: Insufficient documentation

## 2021-08-29 DIAGNOSIS — K625 Hemorrhage of anus and rectum: Secondary | ICD-10-CM | POA: Diagnosis not present

## 2021-08-29 DIAGNOSIS — R739 Hyperglycemia, unspecified: Secondary | ICD-10-CM | POA: Diagnosis not present

## 2021-08-29 DIAGNOSIS — R198 Other specified symptoms and signs involving the digestive system and abdomen: Secondary | ICD-10-CM | POA: Diagnosis not present

## 2021-08-29 DIAGNOSIS — K921 Melena: Secondary | ICD-10-CM | POA: Insufficient documentation

## 2021-08-29 LAB — ABO/RH: ABO/RH(D): O POS

## 2021-08-29 LAB — CBC
HCT: 42.9 % (ref 36.0–46.0)
Hemoglobin: 14 g/dL (ref 12.0–15.0)
MCH: 29.9 pg (ref 26.0–34.0)
MCHC: 32.6 g/dL (ref 30.0–36.0)
MCV: 91.5 fL (ref 80.0–100.0)
Platelets: 168 10*3/uL (ref 150–400)
RBC: 4.69 MIL/uL (ref 3.87–5.11)
RDW: 12.3 % (ref 11.5–15.5)
WBC: 4.7 10*3/uL (ref 4.0–10.5)
nRBC: 0 % (ref 0.0–0.2)

## 2021-08-29 LAB — COMPREHENSIVE METABOLIC PANEL
ALT: 20 U/L (ref 0–44)
AST: 21 U/L (ref 15–41)
Albumin: 4.2 g/dL (ref 3.5–5.0)
Alkaline Phosphatase: 81 U/L (ref 38–126)
Anion gap: 8 (ref 5–15)
BUN: 19 mg/dL (ref 6–20)
CO2: 28 mmol/L (ref 22–32)
Calcium: 9.2 mg/dL (ref 8.9–10.3)
Chloride: 106 mmol/L (ref 98–111)
Creatinine, Ser: 0.85 mg/dL (ref 0.44–1.00)
GFR, Estimated: >60 mL/min (ref >60)
Glucose, Bld: 103 mg/dL — ABNORMAL HIGH (ref 70–99)
Potassium: 4.3 mmol/L (ref 3.5–5.1)
Sodium: 142 mmol/L (ref 135–145)
Total Bilirubin: 0.5 mg/dL (ref 0.3–1.2)
Total Protein: 7.5 g/dL (ref 6.5–8.1)

## 2021-08-29 LAB — TYPE AND SCREEN
ABO/RH(D): O POS
Antibody Screen: NEGATIVE

## 2021-08-29 LAB — POC OCCULT BLOOD, ED: Fecal Occult Bld: POSITIVE — AB

## 2021-08-29 MED ORDER — IOHEXOL 300 MG/ML  SOLN
100.0000 mL | Freq: Once | INTRAMUSCULAR | Status: AC | PRN
  Administered 2021-08-29: 100 mL via INTRAVENOUS

## 2021-08-29 NOTE — ED Notes (Signed)
Pt refuses IV at this time 

## 2021-08-29 NOTE — ED Provider Notes (Signed)
[facility]    CSN: [id number] Arrival date & time: 08/29/21  1052      History   Chief Complaint Chief Complaint  Patient presents with   Blood In Stools    HPI Victoria Stein is a 51 y.o. female.   The history is provided by the patient.   Patient presents for complaints of rectal bleeding that is been present for the past 2 days.  Patient states 2 days ago she had an episode of "extreme gas and since that time she has noticed blood and mucus in her stool.  She states that she can see the blood mixed in with her stool.  She has not noticed any blood with wiping.  She states that the blood is not bright red, nor is it dark or coffee ground.  She states that she has also had intermittent diarrhea, but that has since improved.  She also complains of right upper quadrant abdominal pain that is also intermittent.  She denies fever, chills, gas, bloating, rectal pain, urinary symptoms.  Patient was concerned that she may have hemorrhoids. Past Medical History:  Diagnosis Date   Chronic anxiety    Depression    DUB (dysfunctional uterine bleeding)    DVT of upper extremity (deep vein thrombosis) (HCC)    Associated with IV needle 2015   GERD (gastroesophageal reflux disease)    History of pulmonary embolus (PE)    Solitary subsegmental pulmonary embolism to the right upper lobe 2015   Microcytic anemia    Parotid mass    Benign; status post resection by Dr. Constance Holster    Patient Active Problem List   Diagnosis Date Noted   Cholecystitis 03/02/2016   Abnormal LFTs 06/27/2013   GERD (gastroesophageal reflux disease) 12/87/8676   Diastolic dysfunction 72/09/4707   Iron deficiency anemia due to chronic blood loss 03/09/2013   Acute pulmonary embolism ([place]) 03/08/2013   DVT of upper extremity (deep vein thrombosis) ([place]) 03/08/2013   Acute anxiety 03/08/2013    Past Surgical History:  Procedure Laterality Date   ABDOMINAL HYSTERECTOMY     ABLATION  12/2013    CHOLECYSTECTOMY N/A 03/03/2016   Procedure: LAPAROSCOPIC CHOLECYSTECTOMY;  Surgeon: Aviva Signs, MD;  Location: [facility];  Service: General;  Laterality: N/A;   PAROTID GLAND TUMOR EXCISION     WISDOM TOOTH EXTRACTION      OB History   No obstetric history on file.      Home Medications    Prior to Admission medications   Medication Sig Start Date End Date Taking? Authorizing Provider  aspirin EC 81 MG tablet Take 81 mg by mouth daily. Swallow whole.    [provider]  buPROPion (WELLBUTRIN XL) 300 MG 24 hr tablet Take 300 mg by mouth daily. 08/26/18   [provider]  fexofenadine (ALLEGRA) 60 MG tablet Take 60 mg by mouth 2 (two) times daily.    [provider]  ondansetron (ZOFRAN) 4 MG tablet Take 1 tablet (4 mg total) by mouth every 6 (six) hours. Patient not taking: Reported on 04/18/2020 08/26/19   Garald Balding, PA-C  pantoprazole (PROTONIX) 40 MG tablet Take 40 mg by mouth daily. Patient not taking: Reported on 04/18/2020 11/25/18   [provider]    Family History Family History  Problem Relation Age of Onset   Diabetes Mother    Rheumatologic disease Mother    Hypertension Mother    Heart disease Father    Heart failure Father  Social History Social History   Tobacco Use   Smoking status: Never   Smokeless tobacco: Never   Tobacco comments:    Never smoked  Substance Use Topics   Alcohol use: No   Drug use: No     Allergies   Amoxicillin   Review of Systems Review of Systems Per HPI  Physical Exam Triage Vital Signs ED Triage Vitals  Enc Vitals Group     BP 08/29/21 1115 110/76     Pulse Rate 08/29/21 1115 80     Resp 08/29/21 1115 18     Temp 08/29/21 1115 98.1 F (36.7 C)     Temp src --      SpO2 08/29/21 1115 96 %     Weight --      Height --      Head Circumference --      Peak Flow --      Pain Score 08/29/21 1114 0     Pain Loc --      Pain Edu? --      Excl. in [place]? --    No data  found.  Updated Vital Signs BP 110/76   Pulse 80   Temp 98.1 F (36.7 C)   Resp 18   LMP 01/22/2015   SpO2 96%   Visual Acuity Right Eye Distance:   Left Eye Distance:   Bilateral Distance:    Right Eye Near:   Left Eye Near:    Bilateral Near:     Physical Exam Vitals and nursing note reviewed.  Constitutional:      General: She is not in acute distress.    Appearance: Normal appearance.  HENT:     Head: Normocephalic.  Eyes:     Extraocular Movements: Extraocular movements intact.     Conjunctiva/sclera: Conjunctivae normal.     Pupils: Pupils are equal, round, and reactive to light.  Cardiovascular:     Rate and Rhythm: Normal rate and regular rhythm.     Pulses: Normal pulses.  Pulmonary:     Effort: Pulmonary effort is normal. No respiratory distress.     Breath sounds: Normal breath sounds. No stridor. No wheezing, rhonchi or rales.  Abdominal:     General: There is no distension.     Tenderness: There is abdominal tenderness (RUQ). There is no right CVA tenderness, left CVA tenderness, guarding or rebound.  Genitourinary:    Rectum: Normal. No external hemorrhoid.  Musculoskeletal:     Cervical back: Normal range of motion.  Lymphadenopathy:     Cervical: No cervical adenopathy.  Skin:    General: Skin is warm and dry.  Neurological:     General: No focal deficit present.     Mental Status: She is alert and oriented to person, place, and time.  Psychiatric:        Mood and Affect: Mood normal.        Behavior: Behavior normal.      UC Treatments / Results  Labs (all labs ordered are listed, but only abnormal results are displayed) Labs Reviewed - No data to display  EKG   Radiology No results found.  Procedures Procedures (including critical care time)  Medications Ordered in UC Medications - No data to display  Initial Impression / Assessment and Plan / UC Course  I have reviewed the triage vital signs and the nursing  notes.  Pertinent labs & imaging results that were available during my care of the patient were reviewed by  me and considered in my medical decision making (see chart for details).  Patient presents with complaints of rectal bleeding that been present for the past 2 days.  Patient's exam is within normal limits, her vitals are stable, she is in no acute distress.  On exam, patient did not have any indication of external hemorrhoids.  Discussed with patient that given the limitations in the urgent care setting, recommend that she follow-up in the emergency department for further evaluation.  Patient was in agreement with this plan.  Patient's vital signs are stable, she will travel to the emergency department via private vehicle. Final Clinical Impressions(s) / UC Diagnoses   Final diagnoses:  Rectal bleeding     Discharge Instructions      Go to the emergency department for further evaluation.     ED Prescriptions   None    PDMP not reviewed this encounter.   Tish Men, NP 08/29/21 1131

## 2021-08-29 NOTE — Discharge Instructions (Addendum)
Go to the emergency department for further evaluation

## 2021-08-29 NOTE — ED Triage Notes (Addendum)
Pt presents with episode of extreme gas on Tuesday morning. Patient has been noticing blood and mucus in stool since. Pt did have episode of diarrhea that has stopped. Reports off an on abdominal pain.

## 2021-08-29 NOTE — ED Triage Notes (Signed)
Pt presents with melena x 2 days, sent by UC for Ct of abdomen, history of GERD.

## 2021-08-29 NOTE — ED Notes (Signed)
Patient transported to CT 

## 2021-08-29 NOTE — Discharge Instructions (Signed)
As we discussed your work-up today did not reveal any apparent reason for the bleeding in your stool.  Based on your history I am suspecting that you may be had some hemorrhagic gastroenteritis from the trip.  At the beach versus hemorrhagic colitis versus a small diverticular bleed versus a small internal hemorrhoid.  Given that you do not have an established GI doctor have attached the information of 1 to your discharge information.  I recommend that you call the GI doctor at your first convenience and schedule a colonoscopy and a follow-up.  In the meantime monitor yourself for resolution of the bleeding in your stool, if the quantity of the bleeding increases, you begin to feel weak, lightheaded, or you begin to have severe abdominal pain please return to the emergency department further evaluation.

## 2021-08-29 NOTE — ED Notes (Signed)
Patient is being discharged from the Urgent Care and sent to the Emergency Department via pov . Per christie np, patient is in need of higher level of care due to blood in stool, abdominal pain, no hemorrhoid present. Patient is aware and verbalizes understanding of plan of care.  Vitals:   08/29/21 1115  BP: 110/76  Pulse: 80  Resp: 18  Temp: 98.1 F (36.7 C)  SpO2: 96%

## 2021-08-29 NOTE — ED Provider Notes (Signed)
[facility] EM[facility] DEPART[facility] Provider Note   CSN: [id number] Arrival date & time: 08/29/21  1135     History  Chief Complaint  Patient presents with   Melena    SOCORRO KANITZ is a 51 y.o. female with past medical history significant for previous DVT, PE, previous cholecystitis with cholecystectomy, without current anticoagulation, patient reports that she takes a baby aspirin who presents with concern for melena versus blood in stool over the last 2 days.  Patient will reports that she was just at the beach, had some bowel movement with excessive gas, then high-volume stool.  She reports that she had low appetite the next day and had a low volume bowel movement with some blood mixed in.  She denies any previous history of GI bleed.  She reports she has not had a colonoscopy but has had negative Cologuard.  She reports no strong family history of colon cancer.  No previous history of diverticulitis.  She reports that she had some vague right upper quadrant pain at urgent care, was sent for evaluation, CT scan.  At time my evaluation she denies any pain, nausea, vomiting.  She denies any history of hemorrhoids.  She denies any dysuria, hematuria, fever, chills, vaginal discharge or dyspareunia.  HPI     Home Medications Prior to Admission medications   Medication Sig Start Date End Date Taking? Authorizing Provider  buPROPion (WELLBUTRIN XL) 150 MG 24 hr tablet Take 1 tablet by mouth every morning. 12/18/16  Yes [provider]  aspirin EC 81 MG tablet Take 81 mg by mouth daily. Swallow whole.    [provider]  buPROPion (WELLBUTRIN XL) 300 MG 24 hr tablet Take 300 mg by mouth daily. 08/26/18   [provider]  fexofenadine (ALLEGRA) 60 MG tablet Take 60 mg by mouth 2 (two) times daily.    [provider]  omeprazole (PRILOSEC) 10 MG capsule Take 10 mg by mouth daily. 08/03/21   [provider]  ondansetron (ZOFRAN) 4 MG tablet Take 1 tablet  (4 mg total) by mouth every 6 (six) hours. Patient not taking: Reported on 04/18/2020 08/26/19   Garald Balding, PA-C  pantoprazole (PROTONIX) 40 MG tablet Take 40 mg by mouth daily. Patient not taking: Reported on 04/18/2020 11/25/18   [provider]      Allergies    Amoxicillin    Review of Systems   Review of Systems  Gastrointestinal:  Positive for abdominal pain and blood in stool.  All other systems reviewed and are negative.   Physical Exam Updated Vital Signs BP 112/82 (BP Location: Right Arm)   Pulse 70   Temp 98.6 F (37 C) (Oral)   Resp 16   Ht '5\' 3"'$  (1.6 m)   Wt 62.1 kg   LMP 01/22/2015   SpO2 100%   BMI 24.27 kg/m  Physical Exam Vitals and nursing note reviewed.  Constitutional:      General: She is not in acute distress.    Appearance: Normal appearance.  HENT:     Head: Normocephalic and atraumatic.  Eyes:     General:        Right eye: No discharge.        Left eye: No discharge.  Cardiovascular:     Rate and Rhythm: Normal rate and regular rhythm.     Heart sounds: No murmur heard.    No friction rub. No gallop.  Pulmonary:     Effort: Pulmonary effort is normal.  Breath sounds: Normal breath sounds.  Abdominal:     General: Bowel sounds are normal.     Palpations: Abdomen is soft.     Comments: No significant tenderness palpation, no rebound, rigidity, guarding, no distention or masses palpated.  Genitourinary:    Comments: Patient with mucousy, light brown stool without bright red blood, or dark tarry appearance.  No obvious hemorrhoids noted on rectal exam. Skin:    General: Skin is warm and dry.     Capillary Refill: Capillary refill takes less than 2 seconds.  Neurological:     Mental Status: She is alert and oriented to person, place, and time.  Psychiatric:        Mood and Affect: Mood normal.        Behavior: Behavior normal.     ED Results / Procedures / Treatments   Labs (all labs ordered are listed, but only  abnormal results are displayed) Labs Reviewed  COMPREHENSIVE METABOLIC PANEL - Abnormal; Notable for the following components:      Result Value   Glucose, Bld 103 (*)    All other components within normal limits  POC OCCULT BLOOD, ED - Abnormal; Notable for the following components:   Fecal Occult Bld POSITIVE (*)    All other components within normal limits  CBC  TYPE AND SCREEN  ABO/RH    EKG None  Radiology CT ABDOMEN PELVIS W CONTRAST  Result Date: 08/29/2021 CLINICAL DATA:  Lower abdominal pain and melena x2 days. EXAM: CT ABDOMEN AND PELVIS WITH CONTRAST TECHNIQUE: Multidetector CT imaging of the abdomen and pelvis was performed using the standard protocol following bolus administration of intravenous contrast. RADIATION DOSE REDUCTION: This exam was performed according to the departmental dose-optimization program which includes automated exposure control, adjustment of the mA and/or kV according to patient size and/or use of iterative reconstruction technique. CONTRAST:  124m OMNIPAQUE IOHEXOL 300 MG/ML  SOLN COMPARISON:  March 02, 2016 FINDINGS: Lower chest: No acute abnormality. Hepatobiliary: A 1.0 cm diameter cyst is seen within the right lobe of the liver. Status post cholecystectomy. No biliary dilatation. Pancreas: Unremarkable. No pancreatic ductal dilatation or surrounding inflammatory changes. Spleen: Normal in size without focal abnormality. Adrenals/Urinary Tract: Adrenal glands are unremarkable. Kidneys are normal, without renal calculi, focal lesion, or hydronephrosis. Bilateral extrarenal pelvis are seen. Bladder is unremarkable. Stomach/Bowel: Stomach is within normal limits. Appendix appears normal. Stool is seen throughout the large bowel. No evidence of bowel wall thickening, distention, or inflammatory changes. Vascular/Lymphatic: No significant vascular findings are present. No enlarged abdominal or pelvic lymph nodes. Reproductive: Status post hysterectomy. No  adnexal masses. Other: No abdominal wall hernia or abnormality. No abdominopelvic ascites. Musculoskeletal: No acute or significant osseous findings. IMPRESSION: 1. No acute or active process within the abdomen or pelvis. 2. Evidence of prior cholecystectomy and hysterectomy. 3. Small hepatic cyst. Electronically Signed   By: TVirgina NorfolkM.D.   On: 08/29/2021 17:30    Procedures Procedures    Medications Ordered in ED Medications  iohexol (OMNIPAQUE) 300 MG/ML solution 100 mL (100 mLs Intravenous Contrast Given 08/29/21 1708)    ED Course/ Medical Decision Making/ A&P                           Medical Decision Making Amount and/or Complexity of Data Reviewed Labs: ordered. Radiology: ordered.  Risk Prescription drug management.   This patient is a 51y.o. female who presents to the ED for concern  of blood with stool, diarrhea, gas with some vague right upper quadrant abdominal pain but overall minimal abdominal pain, this involves an extensive number of treatment options, and is a complaint that carries with it a high risk of complications and morbidity. The emergent differential diagnosis prior to evaluation includes, but is not limited to, internal hemorrhoid, large colonic mass, hemorrhagic gastroenteritis, hemorrhagic colitis, diverticular disease, diverticulitis, rectal fissure, anal trauma versus upper GI bleed, melena versus other.   This is not an exhaustive differential.   Past Medical History / Co-morbidities / Social History: Previous history of cholecystitis, cholecystectomy, no strong family history of colon cancer, no history of rectal trauma  Additional history: Chart reviewed. Pertinent results include: Reviewed lab work, imaging from previous emergency department visits, patient does not have any visits in the last 2 years, reviewed some outpatient OB/GYN visits.  She has not seen a gastroenterologist in the past.  Physical Exam: Physical exam performed. The  pertinent findings include: Patient with no clearly thrombosed internal or external hemorrhoids, she has normal rectal tone, minimal stool in the rectal vault, she has a small amount of light brown mucousy stool that is Hemoccult positive.  She has no significant tenderness to palpation on physical exam, the right upper quadrant tenderness that was noted at urgent care is not significantly present at time of my evaluation.  Lab Tests: I ordered, and personally interpreted labs.  The pertinent results include: CMP overall unremarkable, mild hyperglycemia glucose 103 patient is not fasting.  We obtain a type and screen, however patient will not need transfusion as CBC is unremarkable, normal hemoglobin of 14 today.  She has no clinically significant leukocytosis.  Her Hemoccult is positive.   Imaging Studies: I ordered imaging studies including CT abdomen pelvis with contrast. I independently visualized and interpreted imaging which showed postsurgical changes of previous cholecystectomy, hysterectomy, no evidence of diverticular disease or other colonic mass or infection to explain patient's bleeding. I agree with the radiologist interpretation.   Cardiac Monitoring:  The patient was maintained on a cardiac monitor.  My attending physician Dr. Regenia Skeeter viewed and interpreted the cardiac monitored which showed an underlying rhythm of: NSR. I agree with this interpretation.  Disposition: After consideration of the diagnostic results and the patients response to treatment, I feel that this is a well-appearing patient with new onset small amount of GI bleeding.  I am suspicious that she had some hemorrhagic gastroenteritis versus hemorrhagic colitis versus mild diverticular disease based on her overall well presentation, stable vital signs, nontender abdominal exam with Hemoccult positive stool with mucousy quality, discussed that as she is due to and has not had a colonoscopy I recommend that she follow-up  with GI and continue to monitor stool and abdominal pain.  In the meantime extensive return precautions given.  emergency department workup does not suggest an emergent condition requiring admission or immediate intervention beyond what has been performed at this time. The plan is: As above. The patient is safe for discharge and has been instructed to return immediately for worsening symptoms, change in symptoms or any other concerns.  I discussed this case with my attending physician Dr. Regenia Skeeter who cosigned this note including patient's presenting symptoms, physical exam, and planned diagnostics and interventions. Attending physician stated agreement with plan or made changes to plan which were implemented.    Final Clinical Impression(s) / ED Diagnoses Final diagnoses:  Blood in stool  Suspected hemorrhagic gastroenteritis    Rx / DC Orders ED Discharge Orders  None         Dorien Chihuahua 08/29/21 Ezzard Flax, MD 08/30/21 [phone]

## 2021-09-16 ENCOUNTER — Ambulatory Visit (INDEPENDENT_AMBULATORY_CARE_PROVIDER_SITE_OTHER): Payer: 59 | Admitting: Gastroenterology

## 2021-09-16 ENCOUNTER — Encounter: Payer: Self-pay | Admitting: Gastroenterology

## 2021-09-16 ENCOUNTER — Encounter (HOSPITAL_COMMUNITY): Payer: Self-pay | Admitting: Hematology and Oncology

## 2021-09-16 VITALS — BP 101/68 | HR 70 | Temp 97.7°F | Ht 62.5 in | Wt 138.6 lb

## 2021-09-16 DIAGNOSIS — R1310 Dysphagia, unspecified: Secondary | ICD-10-CM

## 2021-09-16 DIAGNOSIS — K219 Gastro-esophageal reflux disease without esophagitis: Secondary | ICD-10-CM | POA: Diagnosis not present

## 2021-09-16 DIAGNOSIS — K625 Hemorrhage of anus and rectum: Secondary | ICD-10-CM | POA: Diagnosis not present

## 2021-09-16 DIAGNOSIS — K5900 Constipation, unspecified: Secondary | ICD-10-CM | POA: Diagnosis not present

## 2021-09-16 MED ORDER — POLYETHYLENE GLYCOL 3350 17 GM/SCOOP PO POWD: ORAL | 0 refills | Status: DC

## 2021-09-16 MED ORDER — OMEPRAZOLE 10 MG PO CPDR
10.0000 mg | DELAYED_RELEASE_CAPSULE | Freq: Every day | ORAL | 3 refills | Status: AC
Start: 2021-09-16 — End: ?

## 2021-09-16 NOTE — Progress Notes (Signed)
GI Office Note    Referring Provider: Celene Squibb, MD Primary Care Physician:  Celene Squibb, MD  Primary Gastroenterologist: Garfield Cornea, MD   Chief Complaint   Chief Complaint  Patient presents with   Rectal Bleeding    Had an episode of diarrhea after eating shrimp at the beach. Noticed blood in stool that day and the day after. Went to urgent care who then sent her to ER. ER referred to [place] for colonoscopy.      History of Present Illness   Victoria Stein is a 51 y.o. female presenting today for further evaluation of rectal bleeding.  She was seen in the ED recently and they requested she follow-up with [place] for colonoscopy.  States she got sick after eating shrimp at the beach last month. Her and husband developed diarrhea shortly after. She had numerous episodes of diarrhea and developed bloody mucous in the stool. Some abdominal cramping. Symptoms persisted for 48 hours and she got concerned about bloody stool. Went to the ED for evaluation.   At baseline, she used to have regular BMs. Started Optavia 10/2020. Lost 40 pounds. Eats a shake every morning and then sensible lunch and dinner, at this point trying to maintain weight loss. She noted with Optavia, diet changes, she has [place] 1 every 2-3 days. She also has chronic GERD, has been on PPI for 8 years. Has been able to get down to prilosec '10mg'$  daily from '40mg'$  daily. Heartburn well controlled. Mild dysphagia at times to bread. She noticed it more recently when she started back eating bread, had cut out of her diet for most of this year. No n/v. No abdominal pain at this point.   Cologuard 3 years ago, she reports as negative. No FH of IBD or colon cancer.   In the ED, August 29, 2021: Sodium 142, BUN 19, creatinine 0.85, LFTs normal, white blood cell count 4700, hemoglobin 14, platelets 168,000.  On DRE, light brown mucus heme positive.  CT abdomen and pelvis with contrast August 2023: IMPRESSION: 1. No acute or  active process within the abdomen or pelvis. 2. Evidence of prior cholecystectomy and hysterectomy. 3. Small hepatic cyst.  Patient with known right liver lesion dating back to 06/2013.    Medications   Current Outpatient Medications  Medication Sig Dispense Refill   aspirin EC 81 MG tablet Take 81 mg by mouth daily. Swallow whole.     buPROPion (WELLBUTRIN XL) 300 MG 24 hr tablet Take 300 mg by mouth daily.     fexofenadine (ALLEGRA) 60 MG tablet Take 60 mg by mouth daily.     omeprazole (PRILOSEC) 10 MG capsule Take 10 mg by mouth daily.     No current facility-administered medications for this visit.    Allergies   Allergies as of 09/16/2021 - Review Complete 09/16/2021  Allergen Reaction Noted   Amoxicillin Swelling 01/11/2013    Past Medical History   Past Medical History:  Diagnosis Date   Chronic anxiety    Depression    DUB (dysfunctional uterine bleeding)    DVT of upper extremity (deep vein thrombosis) (HCC)    Associated with IV needle 2015   GERD (gastroesophageal reflux disease)    History of pulmonary embolus (PE)    Solitary subsegmental pulmonary embolism to the right upper lobe 2015   Microcytic anemia    Parotid mass    Benign; status post resection by Dr. Constance Holster    Past Surgical History  Past Surgical History:  Procedure Laterality Date   ABDOMINAL HYSTERECTOMY     ABLATION  12/2013   CHOLECYSTECTOMY N/A 03/03/2016   Procedure: LAPAROSCOPIC CHOLECYSTECTOMY;  Surgeon: Aviva Signs, MD;  Location: [facility];  Service: General;  Laterality: N/A;   PAROTID GLAND TUMOR EXCISION     WISDOM TOOTH EXTRACTION      Past Family History   Family History  Problem Relation Age of Onset   Diabetes Mother    Rheumatologic disease Mother    Hypertension Mother    Heart attack Mother    Heart failure Mother    Heart disease Father    Heart failure Father    Cancer - Colon Neg Hx    Inflammatory bowel disease Neg Hx     Past Social History   Social  History   Socioeconomic History   Marital status: Married    Spouse name: Not on file   Number of children: Not on file   Years of education: Not on file   Highest education level: Not on file  Occupational History   Not on file  Tobacco Use   Smoking status: Never   Smokeless tobacco: Never   Tobacco comments:    Never smoked  Substance and Sexual Activity   Alcohol use: No   Drug use: No   Sexual activity: Yes  Other Topics Concern   Not on file  Social History Narrative   Not on file   Social Determinants of Health   Financial Resource Strain: Not on file  Food Insecurity: Not on file  Transportation Needs: Not on file  Physical Activity: Not on file  Stress: Not on file  Social Connections: Not on file  Intimate Partner Violence: Not on file    Review of Systems   General: Negative for anorexia, weight loss, fever, chills, fatigue, weakness. Eyes: Negative for vision changes.  ENT: Negative for hoarseness, nasal congestion. See hpi CV: Negative for chest pain, angina, palpitations, dyspnea on exertion, peripheral edema.  Respiratory: Negative for dyspnea at rest, dyspnea on exertion, cough, sputum, wheezing.  GI: See history of present illness. GU:  Negative for dysuria, hematuria, urinary incontinence, urinary frequency, nocturnal urination.  MS: Negative for joint pain, low back pain.  Derm: Negative for rash or itching.  Neuro: Negative for weakness, abnormal sensation, seizure, frequent headaches, memory loss,  confusion.  Psych: Negative for anxiety, depression, suicidal ideation, hallucinations.  Endo: Negative for unusual weight change.  Heme: Negative for bruising or bleeding. Allergy: Negative for rash or hives.  Physical Exam   BP 101/68 (BP Location: Left Arm, Patient Position: Sitting, Cuff Size: Normal)   Pulse 70   Temp 97.7 F (36.5 C) (Temporal)   Ht 5' 2.5" (1.588 m)   Wt 138 lb 9.6 oz (62.9 kg)   LMP 01/22/2015   SpO2 97%   BMI  24.95 kg/m    General: Well-nourished, well-developed in no acute distress.  Head: Normocephalic, atraumatic.   Eyes: Conjunctiva pink, no icterus. Mouth: Oropharyngeal mucosa moist and pink , no lesions erythema or exudate. Neck: Supple without thyromegaly, masses, or lymphadenopathy.  Lungs: Clear to auscultation bilaterally.  Heart: Regular rate and rhythm, no murmurs rubs or gallops.  Abdomen: Bowel sounds are normal, nontender, nondistended, no hepatosplenomegaly or masses,  no abdominal bruits or hernia, no rebound or guarding.   Rectal: not performed Extremities: No lower extremity edema. No clubbing or deformities.  Neuro: Alert and oriented x 4 , grossly normal neurologically.  Skin:  Warm and dry, no rash or jaundice.   Psych: Alert and cooperative, normal mood and affect.  Labs   Lab Results  Component Value Date   CREATININE 0.85 08/29/2021   BUN 19 08/29/2021   NA 142 08/29/2021   K 4.3 08/29/2021   CL 106 08/29/2021   CO2 28 08/29/2021   Lab Results  Component Value Date   ALT 20 08/29/2021   AST 21 08/29/2021   ALKPHOS 81 08/29/2021   BILITOT 0.5 08/29/2021   Lab Results  Component Value Date   WBC 4.7 08/29/2021   HGB 14.0 08/29/2021   HCT 42.9 08/29/2021   MCV 91.5 08/29/2021   PLT 168 08/29/2021    Imaging Studies   CT ABDOMEN PELVIS W CONTRAST  Result Date: 08/29/2021 CLINICAL DATA:  Lower abdominal pain and melena x2 days. EXAM: CT ABDOMEN AND PELVIS WITH CONTRAST TECHNIQUE: Multidetector CT imaging of the abdomen and pelvis was performed using the standard protocol following bolus administration of intravenous contrast. RADIATION DOSE REDUCTION: This exam was performed according to the departmental dose-optimization program which includes automated exposure control, adjustment of the mA and/or kV according to patient size and/or use of iterative reconstruction technique. CONTRAST:  176m OMNIPAQUE IOHEXOL 300 MG/ML  SOLN COMPARISON:  March 02, 2016 FINDINGS: Lower chest: No acute abnormality. Hepatobiliary: A 1.0 cm diameter cyst is seen within the right lobe of the liver. Status post cholecystectomy. No biliary dilatation. Pancreas: Unremarkable. No pancreatic ductal dilatation or surrounding inflammatory changes. Spleen: Normal in size without focal abnormality. Adrenals/Urinary Tract: Adrenal glands are unremarkable. Kidneys are normal, without renal calculi, focal lesion, or hydronephrosis. Bilateral extrarenal pelvis are seen. Bladder is unremarkable. Stomach/Bowel: Stomach is within normal limits. Appendix appears normal. Stool is seen throughout the large bowel. No evidence of bowel wall thickening, distention, or inflammatory changes. Vascular/Lymphatic: No significant vascular findings are present. No enlarged abdominal or pelvic lymph nodes. Reproductive: Status post hysterectomy. No adnexal masses. Other: No abdominal wall hernia or abnormality. No abdominopelvic ascites. Musculoskeletal: No acute or significant osseous findings. IMPRESSION: 1. No acute or active process within the abdomen or pelvis. 2. Evidence of prior cholecystectomy and hysterectomy. 3. Small hepatic cyst. Electronically Signed   By: TVirgina NorfolkM.D.   On: 08/29/2021 17:30    Assessment   Rectal bleeding: in setting of food borne gastroenteritis. Has resolved. Like anorectal irritation. She is overdue for colon cancer screening. Recommend colonoscopy at this time.   Chronic GERD/vague dysphagia: on PPI since 2015. Has been able to reduce dose over the years. If misses dose, she notes recurrent symptoms. She noted recently some mild dysphagia to bread which she added it back to her diet.   We discussed EGD to screen for Barrett's given she has multiple risk factors. She also has vague dysphagia. SHE ONLY WANTS HER ESOPHAGUS DILATED IF OBVIOUS STRICTURE OR RING.   Constipation: developed after starting Optavia/dietary changes for weight loss last year. Add  miralax.   PLAN   Colonoscopy, EGD with possible esophageal dilation. PATIENT ONLY WANTS HER ESOPHAGUS STRETCHED IF THERE IS A STRICTURE OR RING NOTED. IF ESOPHAGUS LOOKS NORMAL, DO NOT STRETCH.  I have discussed the risks, alternatives, benefits with regards to but not limited to the risk of reaction to medication, bleeding, infection, perforation and the patient is agreeable to proceed. Written consent to be obtained. ASA 3. Continue omeprazole '10mg'$  daily before breakfast. Start MiraLAX 1 capful twice daily until soft regular stool.  Then transition to once  daily as needed.  She should not go more than 24 hours without MiraLAX unless she has an adequate bowel movement that day.   Laureen Ochs. Bobby Rumpf, [place], [facility]

## 2021-09-16 NOTE — Patient Instructions (Signed)
I have sent in RX for omeprazole '10mg'$  daily. #90 with one year of refills. Start Miralax one capful twice daily until soft stool. Then continue daily as needed to maintain regular soft stools. Call if this regimen is not effective and we will send in a prescription option. Colonoscopy and upper endoscopy in the near future. Call in the meantime if any questions or concerns.

## 2021-09-26 ENCOUNTER — Encounter (HOSPITAL_COMMUNITY): Payer: Self-pay | Admitting: Hematology and Oncology

## 2021-10-07 ENCOUNTER — Encounter: Payer: Self-pay | Admitting: *Deleted

## 2021-10-07 ENCOUNTER — Telehealth: Payer: Self-pay | Admitting: *Deleted

## 2021-10-07 MED ORDER — PEG 3350-KCL-NA BICARB-NACL 420 G PO SOLR
4000.0000 mL | Freq: Once | ORAL | 0 refills | Status: AC
Start: 2021-10-07 — End: 2021-10-07

## 2021-10-07 NOTE — Telephone Encounter (Signed)
Called pt. Scheduled for TCS/EGD +/-ED ASA 3 with Dr. Gala Romney on 11/1. Aware will mail prep instructions/pre-op appt. Rx for prep sent to pharmacy.

## 2021-10-17 DIAGNOSIS — L578 Other skin changes due to chronic exposure to nonionizing radiation: Secondary | ICD-10-CM | POA: Diagnosis not present

## 2021-10-17 DIAGNOSIS — L821 Other seborrheic keratosis: Secondary | ICD-10-CM | POA: Diagnosis not present

## 2021-10-17 DIAGNOSIS — L814 Other melanin hyperpigmentation: Secondary | ICD-10-CM | POA: Diagnosis not present

## 2021-10-24 ENCOUNTER — Encounter (HOSPITAL_COMMUNITY): Payer: Self-pay | Admitting: Hematology and Oncology

## 2021-10-25 ENCOUNTER — Encounter (HOSPITAL_COMMUNITY): Payer: Self-pay | Admitting: Hematology and Oncology

## 2021-10-30 ENCOUNTER — Telehealth: Payer: Self-pay | Admitting: *Deleted

## 2021-10-30 NOTE — Telephone Encounter (Signed)
Pt cancelled procedures that were scheduled on 11/06/21 due to insurance. She says she will call back once get's insurance figured out.

## 2021-11-04 ENCOUNTER — Encounter (HOSPITAL_COMMUNITY): Payer: 59

## 2021-11-06 ENCOUNTER — Ambulatory Visit (HOSPITAL_COMMUNITY): Admission: RE | Admit: 2021-11-06 | Payer: 59 | Source: Home / Self Care | Admitting: Internal Medicine

## 2021-11-06 ENCOUNTER — Encounter (HOSPITAL_COMMUNITY): Admission: RE | Payer: Self-pay | Source: Home / Self Care

## 2021-11-06 SURGERY — COLONOSCOPY WITH PROPOFOL
Anesthesia: Monitor Anesthesia Care

## 2022-01-22 ENCOUNTER — Encounter (HOSPITAL_COMMUNITY): Payer: Self-pay | Admitting: Hematology and Oncology

## 2022-01-28 DIAGNOSIS — R69 Illness, unspecified: Secondary | ICD-10-CM | POA: Diagnosis not present

## 2022-01-28 DIAGNOSIS — R110 Nausea: Secondary | ICD-10-CM | POA: Diagnosis not present

## 2022-01-28 DIAGNOSIS — N390 Urinary tract infection, site not specified: Secondary | ICD-10-CM | POA: Diagnosis not present

## 2022-01-28 DIAGNOSIS — R109 Unspecified abdominal pain: Secondary | ICD-10-CM | POA: Diagnosis not present

## 2022-01-28 DIAGNOSIS — Z79899 Other long term (current) drug therapy: Secondary | ICD-10-CM | POA: Diagnosis not present

## 2022-02-08 DIAGNOSIS — R110 Nausea: Secondary | ICD-10-CM | POA: Diagnosis not present

## 2022-02-08 DIAGNOSIS — R300 Dysuria: Secondary | ICD-10-CM | POA: Diagnosis not present

## 2022-02-10 ENCOUNTER — Other Ambulatory Visit (HOSPITAL_COMMUNITY): Payer: Self-pay | Admitting: Family Medicine

## 2022-02-10 DIAGNOSIS — R110 Nausea: Secondary | ICD-10-CM

## 2022-02-10 DIAGNOSIS — K219 Gastro-esophageal reflux disease without esophagitis: Secondary | ICD-10-CM | POA: Diagnosis not present

## 2022-02-10 DIAGNOSIS — R300 Dysuria: Secondary | ICD-10-CM | POA: Diagnosis not present

## 2022-02-11 ENCOUNTER — Telehealth: Payer: Self-pay | Admitting: Internal Medicine

## 2022-02-11 NOTE — Telephone Encounter (Signed)
Patient left a message that she wants to reschedule her colonoscopy and endoscopy

## 2022-02-12 DIAGNOSIS — Z111 Encounter for screening for respiratory tuberculosis: Secondary | ICD-10-CM | POA: Diagnosis not present

## 2022-02-12 NOTE — Telephone Encounter (Signed)
Pt last OV 09/2021. She will need OV to reschedule. Thanks!

## 2022-02-13 ENCOUNTER — Other Ambulatory Visit: Payer: Self-pay | Admitting: Family Medicine

## 2022-02-13 DIAGNOSIS — R110 Nausea: Secondary | ICD-10-CM

## 2022-02-17 DIAGNOSIS — R300 Dysuria: Secondary | ICD-10-CM | POA: Diagnosis not present

## 2022-03-03 NOTE — Progress Notes (Unsigned)
GI Office Note    Referring Provider: Benita Stabile, MD Primary Care Physician:  Benita Stabile, MD  Primary Gastroenterologist: Roetta Sessions, MD   Chief Complaint   No chief complaint on file.    History of Present Illness   Victoria Stein is a 52 y.o. female presenting today to reschedule EGD and colonoscopy.  She was last seen in September 2023 for further evaluation of rectal bleeding, chronic GERD.  Patient has history of diarrhea, bloody mucus in the stool last year prompting ED visit.  Likely foodborne related.  On DRE she was Hemoccult positive.  History of negative Cologuard 3 years ago.  In the ED, August 29, 2021: Sodium 142, BUN 19, creatinine 0.85, LFTs normal, white blood cell count 4700, hemoglobin 14, platelets 168,000.  On DRE, light brown mucus heme positive.   CT abdomen and pelvis with contrast August 2023: IMPRESSION: 1. No acute or active process within the abdomen or pelvis. 2. Evidence of prior cholecystectomy and hysterectomy. 3. Small hepatic cyst.   Patient with known right liver lesion dating back to 06/2013.           Medications   Current Outpatient Medications  Medication Sig Dispense Refill   aspirin EC 81 MG tablet Take 81 mg by mouth daily. Swallow whole.     buPROPion (WELLBUTRIN XL) 300 MG 24 hr tablet Take 300 mg by mouth daily.     fexofenadine (ALLEGRA) 60 MG tablet Take 60 mg by mouth daily.     omeprazole (PRILOSEC) 10 MG capsule Take 1 capsule (10 mg total) by mouth daily before breakfast. 90 capsule 3   polyethylene glycol powder (MIRALAX) 17 GM/SCOOP powder Take one capful twice daily until soft stool, then continue once daily as needed 255 g 0   No current facility-administered medications for this visit.    Allergies   Allergies as of 03/04/2022 - Review Complete 09/16/2021  Allergen Reaction Noted   Amoxicillin Swelling 01/11/2013    Past Medical History   Past Medical History:  Diagnosis Date   Chronic  anxiety    Depression    DUB (dysfunctional uterine bleeding)    DVT of upper extremity (deep vein thrombosis) (HCC)    Associated with IV needle 2015   GERD (gastroesophageal reflux disease)    History of pulmonary embolus (PE)    Solitary subsegmental pulmonary embolism to the right upper lobe 2015   Microcytic anemia    Parotid mass    Benign; status post resection by Dr. Pollyann Kennedy    Past Surgical History   Past Surgical History:  Procedure Laterality Date   ABDOMINAL HYSTERECTOMY     ABLATION  12/2013   CHOLECYSTECTOMY N/A 03/03/2016   Procedure: LAPAROSCOPIC CHOLECYSTECTOMY;  Surgeon: Franky Macho, MD;  Location: [facility];  Service: General;  Laterality: N/A;   PAROTID GLAND TUMOR EXCISION     WISDOM TOOTH EXTRACTION      Past Family History   Family History  Problem Relation Age of Onset   Diabetes Mother    Rheumatologic disease Mother    Hypertension Mother    Heart attack Mother    Heart failure Mother    Heart disease Father    Heart failure Father    Cancer - Colon Neg Hx    Inflammatory bowel disease Neg Hx     Past Social History   Social History   Socioeconomic History   Marital status: Married    Spouse name: Not  on file   Number of children: Not on file   Years of education: Not on file   Highest education level: Not on file  Occupational History   Not on file  Tobacco Use   Smoking status: Never   Smokeless tobacco: Never   Tobacco comments:    Never smoked  Substance and Sexual Activity   Alcohol use: No   Drug use: No   Sexual activity: Yes  Other Topics Concern   Not on file  Social History Narrative   Not on file   Social Determinants of Health   Financial Resource Strain: Not on file  Food Insecurity: Not on file  Transportation Needs: Not on file  Physical Activity: Not on file  Stress: Not on file  Social Connections: Not on file  Intimate Partner Violence: Not on file    Review of Systems   General: Negative for  anorexia, weight loss, fever, chills, fatigue, weakness. Eyes: Negative for vision changes.  ENT: Negative for hoarseness, difficulty swallowing , nasal congestion. CV: Negative for chest pain, angina, palpitations, dyspnea on exertion, peripheral edema.  Respiratory: Negative for dyspnea at rest, dyspnea on exertion, cough, sputum, wheezing.  GI: See history of present illness. GU:  Negative for dysuria, hematuria, urinary incontinence, urinary frequency, nocturnal urination.  MS: Negative for joint pain, low back pain.  Derm: Negative for rash or itching.  Neuro: Negative for weakness, abnormal sensation, seizure, frequent headaches, memory loss,  confusion.  Psych: Negative for anxiety, depression, suicidal ideation, hallucinations.  Endo: Negative for unusual weight change.  Heme: Negative for bruising or bleeding. Allergy: Negative for rash or hives.  Physical Exam   LMP 01/22/2015    General: Well-nourished, well-developed in no acute distress.  Head: Normocephalic, atraumatic.   Eyes: Conjunctiva pink, no icterus. Mouth: Oropharyngeal mucosa moist and pink , no lesions erythema or exudate. Neck: Supple without thyromegaly, masses, or lymphadenopathy.  Lungs: Clear to auscultation bilaterally.  Heart: Regular rate and rhythm, no murmurs rubs or gallops.  Abdomen: Bowel sounds are normal, nontender, nondistended, no hepatosplenomegaly or masses,  no abdominal bruits or hernia, no rebound or guarding.   Rectal: *** Extremities: No lower extremity edema. No clubbing or deformities.  Neuro: Alert and oriented x 4 , grossly normal neurologically.  Skin: Warm and dry, no rash or jaundice.   Psych: Alert and cooperative, normal mood and affect.  Labs   *** Imaging Studies   No results found.  Assessment      ?screen tcs PLAN   Colonoscopy, EGD with possible esophageal dilation. PATIENT ONLY WANTS HER ESOPHAGUS STRETCHED IF THERE IS A STRICTURE OR RING NOTED. IF  ESOPHAGUS LOOKS NORMAL, DO NOT STRETCH.  I have discussed the risks, alternatives, benefits with regards to but not limited to the risk of reaction to medication, bleeding, infection, perforation and the patient is agreeable to proceed. Written consent to be obtained. ASA 3.   Leanna Battles. Melvyn Neth, MHS, PA-C [facility]

## 2022-03-03 NOTE — H&P (View-Only) (Signed)
   GI Office Note    Referring Provider: Hall, John Z, MD Primary Care Physician:  Hall, John Z, MD  Primary Gastroenterologist: Michael Rourk, MD   Chief Complaint   No chief complaint on file.    History of Present Illness   Victoria Stein is a 52 y.o. female presenting today to reschedule EGD and colonoscopy.  She was last seen in September 2023 for further evaluation of rectal bleeding, chronic GERD.  Patient has history of diarrhea, bloody mucus in the stool last year prompting ED visit.  Likely foodborne related.  On DRE she was Hemoccult positive.  History of negative Cologuard 3 years ago.  In the ED, August 29, 2021: Sodium 142, BUN 19, creatinine 0.85, LFTs normal, white blood cell count 4700, hemoglobin 14, platelets 168,000.  On DRE, light brown mucus heme positive.   CT abdomen and pelvis with contrast August 2023: IMPRESSION: 1. No acute or active process within the abdomen or pelvis. 2. Evidence of prior cholecystectomy and hysterectomy. 3. Small hepatic cyst.   Patient with known right liver lesion dating back to 06/2013.           Medications   Current Outpatient Medications  Medication Sig Dispense Refill   aspirin EC 81 MG tablet Take 81 mg by mouth daily. Swallow whole.     buPROPion (WELLBUTRIN XL) 300 MG 24 hr tablet Take 300 mg by mouth daily.     fexofenadine (ALLEGRA) 60 MG tablet Take 60 mg by mouth daily.     omeprazole (PRILOSEC) 10 MG capsule Take 1 capsule (10 mg total) by mouth daily before breakfast. 90 capsule 3   polyethylene glycol powder (MIRALAX) 17 GM/SCOOP powder Take one capful twice daily until soft stool, then continue once daily as needed 255 g 0   No current facility-administered medications for this visit.    Allergies   Allergies as of 03/04/2022 - Review Complete 09/16/2021  Allergen Reaction Noted   Amoxicillin Swelling 01/11/2013    Past Medical History   Past Medical History:  Diagnosis Date   Chronic  anxiety    Depression    DUB (dysfunctional uterine bleeding)    DVT of upper extremity (deep vein thrombosis) (HCC)    Associated with IV needle 2015   GERD (gastroesophageal reflux disease)    History of pulmonary embolus (PE)    Solitary subsegmental pulmonary embolism to the right upper lobe 2015   Microcytic anemia    Parotid mass    Benign; status post resection by Dr. Rosen    Past Surgical History   Past Surgical History:  Procedure Laterality Date   ABDOMINAL HYSTERECTOMY     ABLATION  12/2013   CHOLECYSTECTOMY N/A 03/03/2016   Procedure: LAPAROSCOPIC CHOLECYSTECTOMY;  Surgeon: Mark Jenkins, MD;  Location: [facility];  Service: General;  Laterality: N/A;   PAROTID GLAND TUMOR EXCISION     WISDOM TOOTH EXTRACTION      Past Family History   Family History  Problem Relation Age of Onset   Diabetes Mother    Rheumatologic disease Mother    Hypertension Mother    Heart attack Mother    Heart failure Mother    Heart disease Father    Heart failure Father    Cancer - Colon Neg Hx    Inflammatory bowel disease Neg Hx     Past Social History   Social History   Socioeconomic History   Marital status: Married    Spouse name: Not   on file   Number of children: Not on file   Years of education: Not on file   Highest education level: Not on file  Occupational History   Not on file  Tobacco Use   Smoking status: Never   Smokeless tobacco: Never   Tobacco comments:    Never smoked  Substance and Sexual Activity   Alcohol use: No   Drug use: No   Sexual activity: Yes  Other Topics Concern   Not on file  Social History Narrative   Not on file   Social Determinants of Health   Financial Resource Strain: Not on file  Food Insecurity: Not on file  Transportation Needs: Not on file  Physical Activity: Not on file  Stress: Not on file  Social Connections: Not on file  Intimate Partner Violence: Not on file    Review of Systems   General: Negative for  anorexia, weight loss, fever, chills, fatigue, weakness. Eyes: Negative for vision changes.  ENT: Negative for hoarseness, difficulty swallowing , nasal congestion. CV: Negative for chest pain, angina, palpitations, dyspnea on exertion, peripheral edema.  Respiratory: Negative for dyspnea at rest, dyspnea on exertion, cough, sputum, wheezing.  GI: See history of present illness. GU:  Negative for dysuria, hematuria, urinary incontinence, urinary frequency, nocturnal urination.  MS: Negative for joint pain, low back pain.  Derm: Negative for rash or itching.  Neuro: Negative for weakness, abnormal sensation, seizure, frequent headaches, memory loss,  confusion.  Psych: Negative for anxiety, depression, suicidal ideation, hallucinations.  Endo: Negative for unusual weight change.  Heme: Negative for bruising or bleeding. Allergy: Negative for rash or hives.  Physical Exam   LMP 01/22/2015    General: Well-nourished, well-developed in no acute distress.  Head: Normocephalic, atraumatic.   Eyes: Conjunctiva pink, no icterus. Mouth: Oropharyngeal mucosa moist and pink , no lesions erythema or exudate. Neck: Supple without thyromegaly, masses, or lymphadenopathy.  Lungs: Clear to auscultation bilaterally.  Heart: Regular rate and rhythm, no murmurs rubs or gallops.  Abdomen: Bowel sounds are normal, nontender, nondistended, no hepatosplenomegaly or masses,  no abdominal bruits or hernia, no rebound or guarding.   Rectal: *** Extremities: No lower extremity edema. No clubbing or deformities.  Neuro: Alert and oriented x 4 , grossly normal neurologically.  Skin: Warm and dry, no rash or jaundice.   Psych: Alert and cooperative, normal mood and affect.  Labs   *** Imaging Studies   No results found.  Assessment      ?screen tcs PLAN   Colonoscopy, EGD with possible esophageal dilation. PATIENT ONLY WANTS HER ESOPHAGUS STRETCHED IF THERE IS A STRICTURE OR RING NOTED. IF  ESOPHAGUS LOOKS NORMAL, DO NOT STRETCH.  I have discussed the risks, alternatives, benefits with regards to but not limited to the risk of reaction to medication, bleeding, infection, perforation and the patient is agreeable to proceed. Written consent to be obtained. ASA 3.   Charlottie Peragine S. Liandro Thelin, MHS, PA-C [facility]  

## 2022-03-04 ENCOUNTER — Other Ambulatory Visit: Payer: Self-pay | Admitting: *Deleted

## 2022-03-04 ENCOUNTER — Encounter: Payer: Self-pay | Admitting: Gastroenterology

## 2022-03-04 ENCOUNTER — Encounter: Payer: Self-pay | Admitting: *Deleted

## 2022-03-04 ENCOUNTER — Ambulatory Visit (INDEPENDENT_AMBULATORY_CARE_PROVIDER_SITE_OTHER): Payer: 59 | Admitting: Gastroenterology

## 2022-03-04 VITALS — BP 129/81 | HR 67 | Temp 98.6°F | Ht 62.0 in | Wt 139.0 lb

## 2022-03-04 DIAGNOSIS — K219 Gastro-esophageal reflux disease without esophagitis: Secondary | ICD-10-CM

## 2022-03-04 DIAGNOSIS — Z1211 Encounter for screening for malignant neoplasm of colon: Secondary | ICD-10-CM | POA: Insufficient documentation

## 2022-03-04 MED ORDER — CLENPIQ 10-3.5-12 MG-GM -GM/175ML PO SOLN: 1.0000 | ORAL | 0 refills | Status: AC

## 2022-03-04 NOTE — Patient Instructions (Addendum)
Continue omeprazole '20mg'$  daily. Colonoscopy and upper endoscopy to be scheduled. See separate instructions.

## 2022-03-05 ENCOUNTER — Encounter: Payer: Self-pay | Admitting: *Deleted

## 2022-03-18 ENCOUNTER — Encounter (HOSPITAL_COMMUNITY): Payer: Self-pay | Admitting: Hematology and Oncology

## 2022-03-24 NOTE — Patient Instructions (Signed)
Victoria Stein  03/24/2022     @PREFPERIOPPHARMACY @   Your procedure is scheduled on  03/28/2022.   Report to [facility] at  [phone] A.M.   Call this number if you have problems the morning of surgery:  [phone]  If you experience any cold or flu symptoms such as cough, fever, chills, shortness of breath, etc. between now and your scheduled surgery, please notify us at the above number.   Remember:  Follow the diet and prep instructions given to you by the office.     Take these medicines the morning of surgery with A SIP OF WATER                     wellbutrin, allegra, omeprazole.     Do not wear jewelry, make-up or nail polish.  Do not wear lotions, powders, or perfumes, or deodorant.  Do not shave 48 hours prior to surgery.  Men may shave face and neck.  Do not bring valuables to the hospital.  [facility] is not responsible for any belongings or valuables.  Contacts, dentures or bridgework may not be worn into surgery.  Leave your suitcase in the car.  After surgery it may be brought to your room.  For patients admitted to the hospital, discharge time will be determined by your treatment team.  Patients discharged the day of surgery will not be allowed to drive home and must have someone with them for 24 hours.    Special instructions:   DO NOT smoke tobacco or vape for 24 hours before your procedure.  Please read over the following fact sheets that you were given. Anesthesia Post-op Instructions and Care and Recovery After Surgery      Upper Endoscopy, Adult, Care After After the procedure, it is common to have a sore throat. It is also common to have: Mild stomach pain or discomfort. Bloating. Nausea. Follow these instructions at home: The instructions below may help you care for yourself at home. Your health care provider may give you more instructions. If you have questions, ask your health care provider. If you were given a sedative during the  procedure, it can affect you for several hours. Do not drive or operate machinery until your health care provider says that it is safe. If you will be going home right after the procedure, plan to have a responsible adult: Take you home from the hospital or clinic. You will not be allowed to drive. Care for you for the time you are told. Follow instructions from your health care provider about what you may eat and drink. Return to your normal activities as told by your health care provider. Ask your health care provider what activities are safe for you. Take over-the-counter and prescription medicines only as told by your health care provider. Contact a health care provider if you: Have a sore throat that lasts longer than one day. Have trouble swallowing. Have a fever. Get help right away if you: Vomit blood or your vomit looks like coffee grounds. Have bloody, black, or tarry stools. Have a very bad sore throat or you cannot swallow. Have difficulty breathing or very bad pain in your chest or abdomen. These symptoms may be an emergency. Get help right away. Call 911. Do not wait to see if the symptoms will go away. Do not drive yourself to the hospital. Summary After the procedure, it is common to have a sore throat,  mild stomach discomfort, bloating, and nausea. If you were given a sedative during the procedure, it can affect you for several hours. Do not drive until your health care provider says that it is safe. Follow instructions from your health care provider about what you may eat and drink. Return to your normal activities as told by your health care provider. This information is not intended to replace advice given to you by your health care provider. Make sure you discuss any questions you have with your health care provider. Document Revised: 04/03/2021 Document Reviewed: 04/03/2021 Elsevier Patient Education  [place]. Colonoscopy, Adult, Care After The following  information offers guidance on how to care for yourself after your procedure. Your health care provider may also give you more specific instructions. If you have problems or questions, contact your health care provider. What can I expect after the procedure? After the procedure, it is common to have: A small amount of blood in your stool for 24 hours after the procedure. Some gas. Mild cramping or bloating of your abdomen. Follow these instructions at home: Eating and drinking  Drink enough fluid to keep your urine pale yellow. Follow instructions from your health care provider about eating or drinking restrictions. Resume your normal diet as told by your health care provider. Avoid heavy or fried foods that are hard to digest. Activity Rest as told by your health care provider. Avoid sitting for a long time without moving. Get up to take short walks every 1-2 hours. This is important to improve blood flow and breathing. Ask for help if you feel weak or unsteady. Return to your normal activities as told by your health care provider. Ask your health care provider what activities are safe for you. Managing cramping and bloating  Try walking around when you have cramps or feel bloated. If directed, apply heat to your abdomen as told by your health care provider. Use the heat source that your health care provider recommends, such as a moist heat pack or a heating pad. Place a towel between your skin and the heat source. Leave the heat on for 20-30 minutes. Remove the heat if your skin turns bright red. This is especially important if you are unable to feel pain, heat, or cold. You have a greater risk of getting burned. General instructions If you were given a sedative during the procedure, it can affect you for several hours. Do not drive or operate machinery until your health care provider says that it is safe. For the first 24 hours after the procedure: Do not sign important documents. Do  not drink alcohol. Do your regular daily activities at a slower pace than normal. Eat soft foods that are easy to digest. Take over-the-counter and prescription medicines only as told by your health care provider. Keep all follow-up visits. This is important. Contact a health care provider if: You have blood in your stool 2-3 days after the procedure. Get help right away if: You have more than a small spotting of blood in your stool. You have large blood clots in your stool. You have swelling of your abdomen. You have nausea or vomiting. You have a fever. You have increasing pain in your abdomen that is not relieved with medicine. These symptoms may be an emergency. Get help right away. Call 911. Do not wait to see if the symptoms will go away. Do not drive yourself to the hospital. Summary After the procedure, it is common to have a small amount  of blood in your stool. You may also have mild cramping and bloating of your abdomen. If you were given a sedative during the procedure, it can affect you for several hours. Do not drive or operate machinery until your health care provider says that it is safe. Get help right away if you have a lot of blood in your stool, nausea or vomiting, a fever, or increased pain in your abdomen. This information is not intended to replace advice given to you by your health care provider. Make sure you discuss any questions you have with your health care provider. Document Revised: 08/15/2020 Document Reviewed: 08/15/2020 Elsevier Patient Education  [place] After The following information offers guidance on how to care for yourself after your procedure. Your health care provider may also give you more specific instructions. If you have problems or questions, contact your health care provider. What can I expect after the procedure? After the procedure, it is common to have: Tiredness. Little or no memory about what  happened during or after the procedure. Impaired judgment when it comes to making decisions. Nausea or vomiting. Some trouble with balance. Follow these instructions at home: For the time period you were told by your health care provider:  Rest. Do not participate in activities where you could fall or become injured. Do not drive or use machinery. Do not drink alcohol. Do not take sleeping pills or medicines that cause drowsiness. Do not make important decisions or sign legal documents. Do not take care of children on your own. Medicines Take over-the-counter and prescription medicines only as told by your health care provider. If you were prescribed antibiotics, take them as told by your health care provider. Do not stop using the antibiotic even if you start to feel better. Eating and drinking Follow instructions from your health care provider about what you may eat and drink. Drink enough fluid to keep your urine pale yellow. If you vomit: Drink clear fluids slowly and in small amounts as you are able. Clear fluids include water, ice chips, low-calorie sports drinks, and fruit juice that has water added to it (diluted fruit juice). Eat light and bland foods in small amounts as you are able. These foods include bananas, applesauce, rice, lean meats, toast, and crackers. General instructions  Have a responsible adult stay with you for the time you are told. It is important to have someone help care for you until you are awake and alert. If you have sleep apnea, surgery and some medicines can increase your risk for breathing problems. Follow instructions from your health care provider about wearing your sleep device: When you are sleeping. This includes during daytime naps. While taking prescription pain medicines, sleeping medicines, or medicines that make you drowsy. Do not use any products that contain nicotine or tobacco. These products include cigarettes, chewing tobacco, and vaping  devices, such as e-cigarettes. If you need help quitting, ask your health care provider. Contact a health care provider if: You feel nauseous or vomit every time you eat or drink. You feel light-headed. You are still sleepy or having trouble with balance after 24 hours. You get a rash. You have a fever. You have redness or swelling around the IV site. Get help right away if: You have trouble breathing. You have new confusion after you get home. These symptoms may be an emergency. Get help right away. Call 911. Do not wait to see if the symptoms will go away. Do  not drive yourself to the hospital. This information is not intended to replace advice given to you by your health care provider. Make sure you discuss any questions you have with your health care provider. Document Revised: 05/20/2021 Document Reviewed: 05/20/2021 Elsevier Patient Education  [place].

## 2022-03-26 ENCOUNTER — Encounter (HOSPITAL_COMMUNITY): Payer: Self-pay

## 2022-03-26 ENCOUNTER — Encounter (HOSPITAL_COMMUNITY)
Admission: RE | Admit: 2022-03-26 | Discharge: 2022-03-26 | Disposition: A | Discharge status: Home / Self Care | Payer: 59 | Source: Ambulatory Visit | Attending: Internal Medicine | Admitting: Internal Medicine

## 2022-03-26 VITALS — BP 110/64 | HR 73 | Temp 97.6°F | Resp 18 | Ht 62.0 in | Wt 138.9 lb

## 2022-03-26 DIAGNOSIS — D500 Iron deficiency anemia secondary to blood loss (chronic): Secondary | ICD-10-CM | POA: Insufficient documentation

## 2022-03-26 LAB — CBC WITH DIFFERENTIAL/PLATELET
Abs Immature Granulocytes: 0.01 10*3/uL (ref 0.00–0.07)
Basophils Absolute: 0 10*3/uL (ref 0.0–0.1)
Basophils Relative: 1 %
Eosinophils Absolute: 0.1 10*3/uL (ref 0.0–0.5)
Eosinophils Relative: 2 %
HCT: 39.8 % (ref 36.0–46.0)
Hemoglobin: 12.9 g/dL (ref 12.0–15.0)
Immature Granulocytes: 0 %
Lymphocytes Relative: 42 %
Lymphs Abs: 2.2 10*3/uL (ref 0.7–4.0)
MCH: 29.6 pg (ref 26.0–34.0)
MCHC: 32.4 g/dL (ref 30.0–36.0)
MCV: 91.3 fL (ref 80.0–100.0)
Monocytes Absolute: 0.5 10*3/uL (ref 0.1–1.0)
Monocytes Relative: 10 %
Neutro Abs: 2.4 10*3/uL (ref 1.7–7.7)
Neutrophils Relative %: 45 %
Platelets: 159 10*3/uL (ref 150–400)
RBC: 4.36 MIL/uL (ref 3.87–5.11)
RDW: 12.5 % (ref 11.5–15.5)
WBC: 5.3 10*3/uL (ref 4.0–10.5)
nRBC: 0 % (ref 0.0–0.2)

## 2022-03-28 ENCOUNTER — Encounter (HOSPITAL_COMMUNITY): Payer: Self-pay | Admitting: Internal Medicine

## 2022-03-28 ENCOUNTER — Ambulatory Visit (HOSPITAL_BASED_OUTPATIENT_CLINIC_OR_DEPARTMENT_OTHER): Payer: 59 | Admitting: Anesthesiology

## 2022-03-28 ENCOUNTER — Ambulatory Visit (HOSPITAL_COMMUNITY): Payer: 59 | Admitting: Anesthesiology

## 2022-03-28 ENCOUNTER — Ambulatory Visit (HOSPITAL_COMMUNITY)
Admission: RE | Admit: 2022-03-28 | Discharge: 2022-03-28 | Disposition: A | Discharge status: Home / Self Care | Payer: 59 | Attending: Internal Medicine | Admitting: Internal Medicine

## 2022-03-28 ENCOUNTER — Encounter (HOSPITAL_COMMUNITY)
Admission: RE | Disposition: A | Discharge status: Home / Self Care | Payer: Self-pay | Source: Home / Self Care | Attending: Internal Medicine

## 2022-03-28 DIAGNOSIS — Z86718 Personal history of other venous thrombosis and embolism: Secondary | ICD-10-CM | POA: Diagnosis not present

## 2022-03-28 DIAGNOSIS — F418 Other specified anxiety disorders: Secondary | ICD-10-CM | POA: Diagnosis not present

## 2022-03-28 DIAGNOSIS — K317 Polyp of stomach and duodenum: Secondary | ICD-10-CM | POA: Diagnosis not present

## 2022-03-28 DIAGNOSIS — K5900 Constipation, unspecified: Secondary | ICD-10-CM | POA: Diagnosis not present

## 2022-03-28 DIAGNOSIS — Z09 Encounter for follow-up examination after completed treatment for conditions other than malignant neoplasm: Secondary | ICD-10-CM | POA: Diagnosis not present

## 2022-03-28 DIAGNOSIS — Z79899 Other long term (current) drug therapy: Secondary | ICD-10-CM | POA: Diagnosis not present

## 2022-03-28 DIAGNOSIS — K2950 Unspecified chronic gastritis without bleeding: Secondary | ICD-10-CM | POA: Diagnosis not present

## 2022-03-28 DIAGNOSIS — Z1211 Encounter for screening for malignant neoplasm of colon: Secondary | ICD-10-CM | POA: Diagnosis not present

## 2022-03-28 DIAGNOSIS — K449 Diaphragmatic hernia without obstruction or gangrene: Secondary | ICD-10-CM

## 2022-03-28 DIAGNOSIS — K219 Gastro-esophageal reflux disease without esophagitis: Secondary | ICD-10-CM

## 2022-03-28 HISTORY — PX: COLONOSCOPY WITH PROPOFOL: SHX5780

## 2022-03-28 HISTORY — PX: BIOPSY: SHX5522

## 2022-03-28 HISTORY — PX: ESOPHAGOGASTRODUODENOSCOPY (EGD) WITH PROPOFOL: SHX5813

## 2022-03-28 SURGERY — COLONOSCOPY WITH PROPOFOL
Anesthesia: General

## 2022-03-28 MED ORDER — LIDOCAINE HCL 1 % IJ SOLN
INTRAMUSCULAR | Status: DC | PRN
  Administered 2022-03-28: 50 mg via INTRADERMAL

## 2022-03-28 MED ORDER — PROPOFOL 500 MG/50ML IV EMUL
INTRAVENOUS | Status: DC | PRN
  Administered 2022-03-28: 120 ug/kg/min via INTRAVENOUS

## 2022-03-28 MED ORDER — PROPOFOL 10 MG/ML IV BOLUS
INTRAVENOUS | Status: DC | PRN
  Administered 2022-03-28: 20 mg via INTRAVENOUS
  Administered 2022-03-28: 40 mg via INTRAVENOUS
  Administered 2022-03-28 (×2): 50 mg via INTRAVENOUS
  Administered 2022-03-28: 100 mg via INTRAVENOUS

## 2022-03-28 MED ORDER — LACTATED RINGERS IV SOLN
INTRAVENOUS | Status: DC
  Administered 2022-03-28: 1000 mL via INTRAVENOUS

## 2022-03-28 NOTE — Anesthesia Preprocedure Evaluation (Addendum)
Anesthesia Evaluation  Patient identified by MRN, date of birth, ID band Patient awake    Reviewed: Allergy & Precautions, H&P , NPO status , Patient's Chart, lab work & pertinent test results  Airway Mallampati: I  TM Distance: >3 FB Neck ROM: Full    Dental  (+) Dental Advisory Given, Teeth Intact   Pulmonary PE   Pulmonary exam normal breath sounds clear to auscultation       Cardiovascular + DVT  Normal cardiovascular exam Rhythm:Regular Rate:Normal     Neuro/Psych  PSYCHIATRIC DISORDERS Anxiety Depression    negative neurological ROS     GI/Hepatic Neg liver ROS, Bowel prep,GERD  Medicated and Controlled,,  Endo/Other  negative endocrine ROS    Renal/GU negative Renal ROS  negative genitourinary   Musculoskeletal negative musculoskeletal ROS (+)    Abdominal   Peds negative pediatric ROS (+)  Hematology  (+) Blood dyscrasia, anemia   Anesthesia Other Findings Right parotid mass  Reproductive/Obstetrics negative OB ROS                             Anesthesia Physical Anesthesia Plan  ASA: 2  Anesthesia Plan: General   Post-op Pain Management: Minimal or no pain anticipated   Induction: Intravenous  PONV Risk Score and Plan: 1 and Propofol infusion  Airway Management Planned: Nasal Cannula and Natural Airway  Additional Equipment:   Intra-op Plan:   Post-operative Plan:   Informed Consent: I have reviewed the patients History and Physical, chart, labs and discussed the procedure including the risks, benefits and alternatives for the proposed anesthesia with the patient or authorized representative who has indicated his/her understanding and acceptance.     Dental advisory given  Plan Discussed with: CRNA and Surgeon  Anesthesia Plan Comments:        Anesthesia Quick Evaluation

## 2022-03-28 NOTE — Transfer of Care (Signed)
Immediate Anesthesia Transfer of Care Note  Patient: Victoria Stein  Procedure(s) Performed: COLONOSCOPY WITH PROPOFOL ESOPHAGOGASTRODUODENOSCOPY (EGD) WITH PROPOFOL BIOPSY  Patient Location: Short Stay  Anesthesia Type:General  Level of Consciousness: awake  Airway & Oxygen Therapy: Patient Spontanous Breathing  Post-op Assessment: Report given to RN  Post vital signs: Reviewed  Last Vitals:  Vitals Value Taken Time  BP    Temp    Pulse    Resp    SpO2      Last Pain:  Vitals:   03/28/22 0821  TempSrc:   PainSc: 0-No pain      Patients Stated Pain Goal: 7 [phone]  Complications: No notable events documented.

## 2022-03-28 NOTE — Op Note (Signed)
[facility] Patient Name: Victoria Stein Procedure Date: 03/28/2022 8:04 AM MRN: [id number] Date of Birth: 04/01/1970 Attending MD: Norvel Richards , MD, [id number] CSN: [id number] Age: 52 Admit Type: Outpatient Procedure:                Upper GI endoscopy Indications:              Heartburn Providers:                Norvel Richards, MD, Caprice Kluver, Crystal Page Referring MD:              Medicines:                Propofol per Anesthesia Complications:            No immediate complications. Estimated Blood Loss:     Estimated blood loss was minimal. Procedure:                Pre-Anesthesia Assessment:                           - Prior to the procedure, a History and Physical                            was performed, and patient medications and                            allergies were reviewed. The patient's tolerance of                            previous anesthesia was also reviewed. The risks                            and benefits of the procedure and the sedation                            options and risks were discussed with the patient.                            All questions were answered, and informed consent                            was obtained. Prior Anticoagulants: The patient has                            taken no anticoagulant or antiplatelet agents. ASA                            Grade Assessment: II - A patient with mild systemic                            disease. After reviewing the risks and benefits,                            the patient was deemed in satisfactory condition to  undergo the procedure.                           After obtaining informed consent, the endoscope was                            passed under direct vision. Throughout the                            procedure, the patient's blood pressure, pulse, and                            oxygen saturations were monitored continuously. The                             [id number]) scope was introduced through the                            mouth, and advanced to the second part of duodenum.                            The upper GI endoscopy was accomplished without                            difficulty. The patient tolerated the procedure                            well. Scope In: 8:26:42 AM Scope Out: 8:31:29 AM Total Procedure Duration: 0 hours 4 minutes 47 seconds  Findings:      The examined esophagus was normal. Gastric cavity empty. 2 cm hiatal       hernia. Scattered hyperplastic appearing polyps 1 to 3 mm?"pedunculated.       Otherwise, gastric mucosa appeared normal.      A small hiatal hernia was present. Patent pylorus.      The duodenal bulb and second portion of the duodenum were normal. One of       the gastric polyp was removed with cold biopsy forceps. Impression:               - Normal esophagus.                           - Small hiatal hernia. Gastric polyp?"removed.                           - Normal duodenal bulb and second portion of the                            duodenum. Moderate Sedation:      Moderate (conscious) sedation was personally administered by an       anesthesia professional. The following parameters were monitored: oxygen       saturation, heart rate, blood pressure, respiratory rate, EKG, adequacy       of pulmonary ventilation, and response to care. Recommendation:           - Patient has a contact number available for  emergencies. The signs and symptoms of potential                            delayed complications were discussed with the                            patient. Return to normal activities tomorrow.                            Written discharge instructions were provided to the                            patient.                           - Advance diet as tolerated. Continue omeprazole 20                            mg daily. Office visit 6 months. See  colonoscopy                            report. Procedure Code(s):        --- Professional ---                           [phone], Esophagogastroduodenoscopy, flexible,                            transoral; diagnostic, including collection of                            specimen(s) by brushing or washing, when performed                            (separate procedure) Diagnosis Code(s):        --- Professional ---                           K44.9, Diaphragmatic hernia without obstruction or                            gangrene                           R12, Heartburn CPT copyright 2022 American Medical Association. All rights reserved. The codes documented in this report are preliminary and upon coder review may  be revised to meet current compliance requirements. Cristopher Estimable. Damaris Abeln, MD Norvel Richards, MD 03/28/2022 9:02:12 AM This report has been signed electronically. Number of Addenda: 0

## 2022-03-28 NOTE — Interval H&P Note (Signed)
History and Physical Interval Note:  03/28/2022 8:11 AM  Victoria Stein  has presented today for surgery, with the diagnosis of screening colon cancer,chronic GERD,.  The various methods of treatment have been discussed with the patient and family. After consideration of risks, benefits and other options for treatment, the patient has consented to  Procedure(s) with comments: COLONOSCOPY WITH PROPOFOL (N/A) - 9:00 am. asa 3 ESOPHAGOGASTRODUODENOSCOPY (EGD) WITH PROPOFOL (N/A) as a surgical intervention.  The patient's history has been reviewed, patient examined, no change in status, stable for surgery.  I have reviewed the patient's chart and labs.  Questions were answered to the patient's satisfaction.     [place]  No change.  No dysphagia.  Screening EGD longstanding GERD.  First ever average risk screening colonoscopy.  The risks, benefits, limitations, imponderables and alternatives regarding both EGD and colonoscopy have been reviewed with the patient. Questions have been answered. All parties agreeable.

## 2022-03-28 NOTE — Anesthesia Postprocedure Evaluation (Signed)
Anesthesia Post Note  Patient: Victoria Stein  Procedure(s) Performed: COLONOSCOPY WITH PROPOFOL ESOPHAGOGASTRODUODENOSCOPY (EGD) WITH PROPOFOL BIOPSY  Patient location during evaluation: Short Stay Anesthesia Type: General Level of consciousness: awake and alert Pain management: pain level controlled Vital Signs Assessment: post-procedure vital signs reviewed and stable Respiratory status: spontaneous breathing Cardiovascular status: stable Postop Assessment: no apparent nausea or vomiting Anesthetic complications: no   No notable events documented.   Last Vitals:  Vitals:   03/28/22 0635 03/28/22 0858  BP: 105/74 102/65  Pulse:  81  Resp: 13 16  Temp: 36.9 C 36.7 C  SpO2: 99% 98%    Last Pain:  Vitals:   03/28/22 0858  TempSrc: Oral  PainSc: 0-No pain                 Tamitha Norell

## 2022-03-28 NOTE — Discharge Instructions (Signed)
Colonoscopy Discharge Instructions  Read the instructions outlined below and refer to this sheet in the next few weeks. These discharge instructions provide you with general information on caring for yourself after you leave the hospital. Your doctor may also give you specific instructions. While your treatment has been planned according to the most current medical practices available, unavoidable complications occasionally occur. If you have any problems or questions after discharge, call Dr. Gala Romney at [phone]. ACTIVITY You may resume your regular activity, but move at a slower pace for the next 24 hours.  Take frequent rest periods for the next 24 hours.  Walking will help get rid of the air and reduce the bloated feeling in your belly (abdomen).  No driving for 24 hours (because of the medicine (anesthesia) used during the test).   Do not sign any important legal documents or operate any machinery for 24 hours (because of the anesthesia used during the test).  NUTRITION Drink plenty of fluids.  You may resume your normal diet as instructed by your doctor.  Begin with a light meal and progress to your normal diet. Heavy or fried foods are harder to digest and may make you feel sick to your stomach (nauseated).  Avoid alcoholic beverages for 24 hours or as instructed.  MEDICATIONS You may resume your normal medications unless your doctor tells you otherwise.  WHAT YOU CAN EXPECT TODAY Some feelings of bloating in the abdomen.  Passage of more gas than usual.  Spotting of blood in your stool or on the toilet paper.  IF YOU HAD POLYPS REMOVED DURING THE COLONOSCOPY: No aspirin products for 7 days or as instructed.  No alcohol for 7 days or as instructed.  Eat a soft diet for the next 24 hours.  FINDING OUT THE RESULTS OF YOUR TEST Not all test results are available during your visit. If your test results are not back during the visit, make an appointment with your caregiver to find out the  results. Do not assume everything is normal if you have not heard from your caregiver or the medical facility. It is important for you to follow up on all of your test results.  SEEK IMMEDIATE MEDICAL ATTENTION IF: You have more than a spotting of blood in your stool.  Your belly is swollen (abdominal distention).  You are nauseated or vomiting.  You have a temperature over 101.  You have abdominal pain or discomfort that is severe or gets worse throughout the day.   EGD Discharge instructions Please read the instructions outlined below and refer to this sheet in the next few weeks. These discharge instructions provide you with general information on caring for yourself after you leave the hospital. Your doctor may also give you specific instructions. While your treatment has been planned according to the most current medical practices available, unavoidable complications occasionally occur. If you have any problems or questions after discharge, please call your doctor. ACTIVITY You may resume your regular activity but move at a slower pace for the next 24 hours.  Take frequent rest periods for the next 24 hours.  Walking will help expel (get rid of) the air and reduce the bloated feeling in your abdomen.  No driving for 24 hours (because of the anesthesia (medicine) used during the test).  You may shower.  Do not sign any important legal documents or operate any machinery for 24 hours (because of the anesthesia used during the test).  NUTRITION Drink plenty of fluids.  You may  resume your normal diet.  Begin with a light meal and progress to your normal diet.  Avoid alcoholic beverages for 24 hours or as instructed by your caregiver.  MEDICATIONS You may resume your normal medications unless your caregiver tells you otherwise.  WHAT YOU CAN EXPECT TODAY You may experience abdominal discomfort such as a feeling of fullness or "gas" pains.  FOLLOW-UP Your doctor will discuss the results of  your test with you.  SEEK IMMEDIATE MEDICAL ATTENTION IF ANY OF THE FOLLOWING OCCUR: Excessive nausea (feeling sick to your stomach) and/or vomiting.  Severe abdominal pain and distention (swelling).  Trouble swallowing.  Temperature over 101 F (37.8 C).  Rectal bleeding or vomiting of blood.     You have a small (2 cm hiatal hernia) nothing to worry about  Your esophagus appeared normal.  You had a gastric polyp which was removed.  Your colonoscopy was normal.  Further recommendations to follow once I get pathology report back  Recommend repeat screening colonoscopy in 10 years  Continue omeprazole 20 mg daily best taken 30 minutes before meal  GERD information provided  Office visit with [place] in 6 months  At patient request, I called Josh at [phone] -reviewed findings and recommendations

## 2022-03-28 NOTE — Op Note (Addendum)
[facility] Patient Name: Victoria Stein Procedure Date: 03/28/2022 8:03 AM MRN: [id number] Date of Birth: [birth year] Attending MD: Norvel Richards , MD, [id number] CSN: [id number] Age: 52 Admit Type: Outpatient Procedure:                Colonoscopy Indications:              Screening for colorectal malignant neoplasm Providers:                Norvel Richards, MD, Caprice Kluver, [place] Page Referring MD:              Medicines:                Propofol per Anesthesia Complications:            No immediate complications. Estimated Blood Loss:     Estimated blood loss: none. Estimated blood loss:                            none. Procedure:                Pre-Anesthesia Assessment:                           - Prior to the procedure, a History and Physical                            was performed, and patient medications and                            allergies were reviewed. The patient's tolerance of                            previous anesthesia was also reviewed. The risks                            and benefits of the procedure and the sedation                            options and risks were discussed with the patient.                            All questions were answered, and informed consent                            was obtained. Prior Anticoagulants: The patient has                            taken no anticoagulant or antiplatelet agents. ASA                            Grade Assessment: II - A patient with mild systemic                            disease. After reviewing the risks and benefits,  the patient was deemed in satisfactory condition to                            undergo the procedure.                           After obtaining informed consent, the colonoscope                            was passed under direct vision. Throughout the                            procedure, the patient's blood pressure, pulse, and                             oxygen saturations were monitored continuously. The                            [id number]) scope was introduced through the                            anus and advanced to the the cecum, identified by                            appendiceal orifice and ileocecal valve. The                            colonoscopy was performed without difficulty. The                            patient tolerated the procedure well. The quality                            of the bowel preparation was adequate. The                            ileocecal valve, appendiceal orifice, and rectum                            were photographed. Scope In: [id number] AM Scope Out: 8:51:23 AM Scope Withdrawal Time: 0 hours 8 minutes 34 seconds  Total Procedure Duration: 0 hours 14 minutes 37 seconds  Findings:      The perianal and digital rectal examinations were normal.      The colon (entire examined portion) appeared normal.      The exam was otherwise without abnormality on direct and retroflexion       views. Impression:               - The entire examined colon is normal.                           - The examination was otherwise normal on direct                            and retroflexion views.                           -  No specimens collected. Moderate Sedation:      Moderate (conscious) sedation was personally administered by an       anesthesia professional. The following parameters were monitored: oxygen       saturation, heart rate, blood pressure, respiratory rate, EKG, adequacy       of pulmonary ventilation, and response to care. Recommendation:           - Patient has a contact number available for                            emergencies. The signs and symptoms of potential                            delayed complications were discussed with the                            patient. Return to normal activities tomorrow.                            Written discharge instructions were provided to the                             patient.                           - Advance diet as tolerated.                           - Continue present medications.                           - Repeat colonoscopy in 10 years for screening                            purposes.                           - Return to GI office in 6 months. Procedure Code(s):        --- Professional ---                           [phone], Colonoscopy, flexible; diagnostic, including                            collection of specimen(s) by brushing or washing,                            when performed (separate procedure) Diagnosis Code(s):        --- Professional ---                           Z12.11, Encounter for screening for malignant                            neoplasm of colon CPT copyright 2022 [employer]. All rights reserved. The codes documented in this report are preliminary and upon  coder review may  be revised to meet current compliance requirements. Cristopher Estimable. Raegan Sipp, MD Norvel Richards, MD 03/28/2022 8:59:24 AM This report has been signed electronically. Number of Addenda: 0

## 2022-03-31 LAB — SURGICAL PATHOLOGY

## 2022-04-01 ENCOUNTER — Encounter: Payer: Self-pay | Admitting: Internal Medicine

## 2022-04-09 ENCOUNTER — Encounter (HOSPITAL_COMMUNITY): Payer: Self-pay | Admitting: Internal Medicine

## 2022-05-01 DIAGNOSIS — R7303 Prediabetes: Secondary | ICD-10-CM | POA: Diagnosis not present

## 2022-05-01 DIAGNOSIS — I10 Essential (primary) hypertension: Secondary | ICD-10-CM | POA: Diagnosis not present

## 2022-05-01 DIAGNOSIS — E782 Mixed hyperlipidemia: Secondary | ICD-10-CM | POA: Diagnosis not present

## 2022-05-08 DIAGNOSIS — R7303 Prediabetes: Secondary | ICD-10-CM | POA: Diagnosis not present

## 2022-05-08 DIAGNOSIS — K219 Gastro-esophageal reflux disease without esophagitis: Secondary | ICD-10-CM | POA: Diagnosis not present

## 2022-05-08 DIAGNOSIS — E782 Mixed hyperlipidemia: Secondary | ICD-10-CM | POA: Diagnosis not present

## 2022-05-08 DIAGNOSIS — Z0001 Encounter for general adult medical examination with abnormal findings: Secondary | ICD-10-CM | POA: Diagnosis not present

## 2022-05-08 DIAGNOSIS — R944 Abnormal results of kidney function studies: Secondary | ICD-10-CM | POA: Diagnosis not present

## 2022-05-08 DIAGNOSIS — F411 Generalized anxiety disorder: Secondary | ICD-10-CM | POA: Diagnosis not present

## 2022-05-08 DIAGNOSIS — I10 Essential (primary) hypertension: Secondary | ICD-10-CM | POA: Diagnosis not present

## 2022-05-08 DIAGNOSIS — Z79899 Other long term (current) drug therapy: Secondary | ICD-10-CM | POA: Diagnosis not present

## 2022-05-08 DIAGNOSIS — Z0000 Encounter for general adult medical examination without abnormal findings: Secondary | ICD-10-CM | POA: Diagnosis not present

## 2022-05-13 DIAGNOSIS — Z1231 Encounter for screening mammogram for malignant neoplasm of breast: Secondary | ICD-10-CM | POA: Diagnosis not present

## 2022-09-25 ENCOUNTER — Telehealth: Payer: Self-pay | Admitting: Internal Medicine

## 2022-09-25 NOTE — Telephone Encounter (Signed)
LMOM for patient to return call. She is due her 6 month post procedure follow up.

## 2022-11-04 DIAGNOSIS — E782 Mixed hyperlipidemia: Secondary | ICD-10-CM | POA: Diagnosis not present

## 2022-11-04 DIAGNOSIS — R7303 Prediabetes: Secondary | ICD-10-CM | POA: Diagnosis not present

## 2022-11-07 DIAGNOSIS — Z6825 Body mass index (BMI) 25.0-25.9, adult: Secondary | ICD-10-CM | POA: Diagnosis not present

## 2022-11-07 DIAGNOSIS — E782 Mixed hyperlipidemia: Secondary | ICD-10-CM | POA: Diagnosis not present

## 2022-11-07 DIAGNOSIS — Z713 Dietary counseling and surveillance: Secondary | ICD-10-CM | POA: Diagnosis not present

## 2022-11-07 DIAGNOSIS — Z7182 Exercise counseling: Secondary | ICD-10-CM | POA: Diagnosis not present

## 2022-11-07 DIAGNOSIS — F411 Generalized anxiety disorder: Secondary | ICD-10-CM | POA: Diagnosis not present

## 2022-11-07 DIAGNOSIS — I10 Essential (primary) hypertension: Secondary | ICD-10-CM | POA: Diagnosis not present

## 2022-11-07 DIAGNOSIS — R7303 Prediabetes: Secondary | ICD-10-CM | POA: Diagnosis not present

## 2022-11-07 DIAGNOSIS — K219 Gastro-esophageal reflux disease without esophagitis: Secondary | ICD-10-CM | POA: Diagnosis not present

## 2023-01-21 DIAGNOSIS — N644 Mastodynia: Secondary | ICD-10-CM | POA: Diagnosis not present

## 2023-02-26 DIAGNOSIS — U071 COVID-19: Secondary | ICD-10-CM | POA: Diagnosis not present

## 2023-03-05 DIAGNOSIS — Z01419 Encounter for gynecological examination (general) (routine) without abnormal findings: Secondary | ICD-10-CM | POA: Diagnosis not present

## 2023-04-29 ENCOUNTER — Ambulatory Visit
Admission: RE | Admit: 2023-04-29 | Discharge: 2023-04-29 | Disposition: A | Discharge status: Home / Self Care | Payer: Self-pay | Source: Ambulatory Visit | Attending: Family Medicine | Admitting: Family Medicine

## 2023-04-29 VITALS — BP 118/76 | HR 89 | Temp 99.3°F | Resp 18

## 2023-04-29 DIAGNOSIS — R300 Dysuria: Secondary | ICD-10-CM

## 2023-04-29 DIAGNOSIS — J069 Acute upper respiratory infection, unspecified: Secondary | ICD-10-CM

## 2023-04-29 LAB — POCT URINALYSIS DIP (MANUAL ENTRY)
Bilirubin, UA: NEGATIVE
Blood, UA: NEGATIVE
Glucose, UA: NEGATIVE mg/dL
Leukocytes, UA: NEGATIVE
Nitrite, UA: NEGATIVE
Protein Ur, POC: 30 mg/dL — AB
Spec Grav, UA: 1.025 (ref 1.010–1.025)
Urobilinogen, UA: 0.2 U/dL
pH, UA: 5.5 (ref 5.0–8.0)

## 2023-04-29 LAB — POC COVID19/FLU A&B COMBO
Covid Antigen, POC: NEGATIVE
Influenza A Antigen, POC: NEGATIVE
Influenza B Antigen, POC: NEGATIVE

## 2023-04-29 MED ORDER — PROMETHAZINE-DM 6.25-15 MG/5ML PO SYRP: 5.0000 mL | ORAL_SOLUTION | Freq: Four times a day (QID) | ORAL | 0 refills | Status: DC | PRN

## 2023-04-29 MED ORDER — FLUTICASONE PROPIONATE 50 MCG/ACT NA SUSP: 1.0000 | Freq: Two times a day (BID) | NASAL | 2 refills | Status: DC

## 2023-04-29 NOTE — ED Triage Notes (Signed)
 Sore throat, cough and congestion, fever, burning on urination since yesterday

## 2023-04-29 NOTE — ED Provider Notes (Signed)
 [facility]    CSN: [id number] Arrival date & time: 04/29/23  1232      History   Chief Complaint Chief Complaint  Patient presents with   Fever    Fever chills coughing general aches - Entered by patient    HPI Victoria Stein is a 53 y.o. female.   Patient presenting today with 1 day history of sore throat, cough, congestion, intermittent fevers and now also some dysuria, cloudy urine.  Denies abdominal pain, vomiting, diarrhea, rashes, chest pain, shortness of breath.  So far not tried anything over-the-counter for symptoms.  Multiple sick contacts recently.    Past Medical History:  Diagnosis Date   Chronic anxiety    Depression    DUB (dysfunctional uterine bleeding)    DVT of upper extremity (deep vein thrombosis) (HCC)    Associated with IV needle 2015   GERD (gastroesophageal reflux disease)    History of pulmonary embolus (PE) 2016   Solitary subsegmental pulmonary embolism to the right upper lobe 2015   Microcytic anemia    Parotid mass    Benign; status post resection by Dr. Donalee Fruits    Patient Active Problem List   Diagnosis Date Noted   Encounter for screening colonoscopy 03/04/2022   Rectal bleeding 09/16/2021   Dysphagia 09/16/2021   Constipation 09/16/2021   Cholecystitis 03/02/2016   Abnormal LFTs 06/27/2013   GERD (gastroesophageal reflux disease) 06/23/2013   Diastolic dysfunction 03/10/2013   Iron deficiency anemia due to chronic blood loss 03/09/2013   Acute pulmonary embolism (HCC) 03/08/2013   DVT of upper extremity (deep vein thrombosis) (HCC) 03/08/2013   Acute anxiety 03/08/2013    Past Surgical History:  Procedure Laterality Date   ABDOMINAL HYSTERECTOMY     ABLATION  12/2013   BIOPSY  03/28/2022   Procedure: BIOPSY;  Surgeon: Suzette Espy, MD;  Location: [facility];  Service: Endoscopy;;   CHOLECYSTECTOMY N/A 03/03/2016   Procedure: LAPAROSCOPIC CHOLECYSTECTOMY;  Surgeon: Alanda Allegra, MD;  Location: [facility];   Service: General;  Laterality: N/A;   COLONOSCOPY WITH PROPOFOL  N/A 03/28/2022   Procedure: COLONOSCOPY WITH PROPOFOL ;  Surgeon: Suzette Espy, MD;  Location: [facility];  Service: Endoscopy;  Laterality: N/A;  9:00 am. asa 3   ESOPHAGOGASTRODUODENOSCOPY (EGD) WITH PROPOFOL  N/A 03/28/2022   Procedure: ESOPHAGOGASTRODUODENOSCOPY (EGD) WITH PROPOFOL ;  Surgeon: Suzette Espy, MD;  Location: [facility];  Service: Endoscopy;  Laterality: N/A;   PAROTID GLAND TUMOR EXCISION     WISDOM TOOTH EXTRACTION      OB History   No obstetric history on file.      Home Medications    Prior to Admission medications   Medication Sig Start Date End Date Taking? Authorizing Provider  fluticasone  (FLONASE ) 50 MCG/ACT nasal spray Place 1 spray into both nostrils 2 (two) times daily. 04/29/23  Yes Corbin Dess, PA-C  promethazine -dextromethorphan (PROMETHAZINE -DM) 6.25-15 MG/5ML syrup Take 5 mLs by mouth 4 (four) times daily as needed. 04/29/23  Yes Corbin Dess, PA-C  aspirin EC 81 MG tablet Take 81 mg by mouth daily. Swallow whole.    [provider]  buPROPion  (WELLBUTRIN  XL) 300 MG 24 hr tablet Take 300 mg by mouth daily. 08/26/18   [provider]  fexofenadine (ALLEGRA) 60 MG tablet Take 60 mg by mouth daily.    [provider]  omeprazole  (PRILOSEC) 10 MG capsule Take 1 capsule (10 mg total) by mouth daily before breakfast. Patient taking differently: Take  20 mg by mouth daily before breakfast. 09/16/21   Lanney Pitts, PA-C  Sod Picosulfate-Mag Ox-Cit Acd (CLENPIQ ) 10-3.5-12 MG-GM -GM/175ML SOLN Take 1 kit by mouth as directed. 03/04/22   Rourk, [place] Hatcher, MD    Family History Family History  Problem Relation Age of Onset   Diabetes Mother    Rheumatologic disease Mother    Hypertension Mother    Heart attack Mother    Heart failure Mother    Heart disease Father    Heart failure Father    Cancer - Colon Neg Hx    Inflammatory bowel disease  Neg Hx     Social History Social History   Tobacco Use   Smoking status: Never   Smokeless tobacco: Never   Tobacco comments:    Never smoked  Substance Use Topics   Alcohol use: No   Drug use: No     Allergies   Amoxicillin    Review of Systems Review of Systems Per HPI  Physical Exam Triage Vital Signs ED Triage Vitals  Encounter Vitals Group     BP 04/29/23 1309 118/76     Systolic BP Percentile --      Diastolic BP Percentile --      Pulse Rate 04/29/23 1309 89     Resp 04/29/23 1309 18     Temp 04/29/23 1309 99.3 F (37.4 C)     Temp Source 04/29/23 1309 Oral     SpO2 04/29/23 1309 98 %     Weight --      Height --      Head Circumference --      Peak Flow --      Pain Score 04/29/23 1311 0     Pain Loc --      Pain Education --      Exclude from Growth Chart --    No data found.  Updated Vital Signs BP 118/76 (BP Location: Right Arm)   Pulse 89   Temp 99.3 F (37.4 C) (Oral)   Resp 18   LMP 01/22/2015   SpO2 98%   Visual Acuity Right Eye Distance:   Left Eye Distance:   Bilateral Distance:    Right Eye Near:   Left Eye Near:    Bilateral Near:     Physical Exam Vitals and nursing note reviewed.  Constitutional:      Appearance: Normal appearance.  HENT:     Head: Atraumatic.     Right Ear: Tympanic membrane and external ear normal.     Left Ear: Tympanic membrane and external ear normal.     Nose: Rhinorrhea present.     Mouth/Throat:     Mouth: Mucous membranes are moist.     Pharynx: Posterior oropharyngeal erythema present.  Eyes:     Extraocular Movements: Extraocular movements intact.     Conjunctiva/sclera: Conjunctivae normal.  Cardiovascular:     Rate and Rhythm: Normal rate and regular rhythm.     Heart sounds: Normal heart sounds.  Pulmonary:     Effort: Pulmonary effort is normal.     Breath sounds: Normal breath sounds. No wheezing.  Abdominal:     General: Bowel sounds are normal. There is no distension.      Palpations: Abdomen is soft.     Tenderness: There is no abdominal tenderness. There is no right CVA tenderness, left CVA tenderness or guarding.  Musculoskeletal:        General: Normal range of motion.  Cervical back: Normal range of motion and neck supple.  Skin:    General: Skin is warm and dry.  Neurological:     Mental Status: She is alert and oriented to person, place, and time.  Psychiatric:        Mood and Affect: Mood normal.        Thought Content: Thought content normal.      UC Treatments / Results  Labs (all labs ordered are listed, but only abnormal results are displayed) Labs Reviewed  POCT URINALYSIS DIP (MANUAL ENTRY) - Abnormal; Notable for the following components:      Result Value   Ketones, POC UA trace (5) (*)    Protein Ur, POC =30 (*)    All other components within normal limits  POC COVID19/FLU A&B COMBO    EKG   Radiology No results found.  Procedures Procedures (including critical care time)  Medications Ordered in UC Medications - No data to display  Initial Impression / Assessment and Plan / UC Course  I have reviewed the triage vital signs and the nursing notes.  Pertinent labs & imaging results that were available during my care of the patient were reviewed by me and considered in my medical decision making (see chart for details).     Vital signs and exam overall reassuring today, urinalysis without evidence of urinary tract infection today.  Rapid flu and COVID-negative.  Suspect viral upper respiratory infection.  Treat with Phenergan  DM, Flonase , support over-the-counter medications and home care.  Push fluids and return if worsening symptoms.  Work note given.  Final Clinical Impressions(s) / UC Diagnoses   Final diagnoses:  Viral URI with cough  Dysuria   Discharge Instructions   None    ED Prescriptions     Medication Sig Dispense Auth. Provider   promethazine -dextromethorphan (PROMETHAZINE -DM) 6.25-15 MG/5ML  syrup Take 5 mLs by mouth 4 (four) times daily as needed. 100 mL Corbin Dess, PA-C   fluticasone  (FLONASE ) 50 MCG/ACT nasal spray Place 1 spray into both nostrils 2 (two) times daily. 16 g Corbin Dess, [place]      PDMP not reviewed this encounter.   Corbin Dess, [place] 04/29/23 1441

## 2023-04-30 ENCOUNTER — Encounter (HOSPITAL_COMMUNITY): Payer: Self-pay | Admitting: Hematology and Oncology

## 2023-05-02 ENCOUNTER — Ambulatory Visit
Admission: RE | Admit: 2023-05-02 | Discharge: 2023-05-02 | Disposition: A | Discharge status: Home / Self Care | Payer: Self-pay | Source: Ambulatory Visit | Attending: Nurse Practitioner | Admitting: Nurse Practitioner

## 2023-05-02 ENCOUNTER — Ambulatory Visit (INDEPENDENT_AMBULATORY_CARE_PROVIDER_SITE_OTHER): Payer: Self-pay

## 2023-05-02 VITALS — BP 107/69 | HR 92 | Temp 100.2°F | Resp 18

## 2023-05-02 DIAGNOSIS — R059 Cough, unspecified: Secondary | ICD-10-CM

## 2023-05-02 DIAGNOSIS — J189 Pneumonia, unspecified organism: Secondary | ICD-10-CM

## 2023-05-02 MED ORDER — ALBUTEROL SULFATE HFA 108 (90 BASE) MCG/ACT IN AERS: 2.0000 | INHALATION_SPRAY | Freq: Four times a day (QID) | RESPIRATORY_TRACT | 0 refills | Status: AC | PRN

## 2023-05-02 MED ORDER — PREDNISONE 20 MG PO TABS: 40.0000 mg | ORAL_TABLET | Freq: Every day | ORAL | 0 refills | Status: AC

## 2023-05-02 MED ORDER — DOXYCYCLINE HYCLATE 100 MG PO TABS: 100.0000 mg | ORAL_TABLET | Freq: Two times a day (BID) | ORAL | 0 refills | Status: AC

## 2023-05-02 NOTE — Discharge Instructions (Signed)
 The chest x-ray shows that you have pneumonia in your right lung. Take medication as prescribed.  Continue the current cough medication you were prescribed, begin using the Flonase  as discussed. Increase fluids and allow for plenty of rest. Take over-the-counter Tylenol  or ibuprofen  as needed for pain, fever, or general discomfort. Recommend using a humidifier in your bedroom at nighttime during sleep and sleeping elevated on pillows while cough symptoms persist. Go to the emergency department immediately if you continue to experience fever, or develop difficulty breathing, wheezing, shortness of breath, or other concerns. Follow-up with your primary care physician within the next 10 to 14 days for reevaluation. Follow-up as needed.

## 2023-05-02 NOTE — ED Triage Notes (Signed)
 Was seen on 4/23 for cough, congestion and fever.  states fever returned yesterday.  States she took z-pack yesterday that her doctor had prescribed her a while back.  States she started feeling much worse last night.  States she hears herself rattle when breathing

## 2023-05-02 NOTE — ED Provider Notes (Signed)
 [facility]    CSN: [id number] Arrival date & time: 05/02/23  1137      History   Chief Complaint Chief Complaint  Patient presents with   Chills    Came in Wednesday with fever, cough, general aches and diagnosed as a virus.  Yesterday  I started running a fever again and coughing hurts and sounds worse. - Entered by patient    HPI Victoria Stein is a 53 y.o. female.   The history is provided by the patient.   Patient presents for follow-up for continued cough and fever.  Patient was seen in this clinic on 04/29/2023.  She tested negative for COVID/flu, and influenza, she was diagnosed with a viral URI.  Patient was prescribed Promethazine  DM for her cough, and fluticasone  for nasal congestion and runny nose.  Patient states that she has been taking the promethazine , but never started the Flonase .  She reports that she started to run a fever again yesterday, fever was around 100.  She states that she also has a persistent cough.  States that she had an old prescription for azithromycin from her doctor, states that she started taking that last evening.  States that she feels worse today.  She states that she can hear a "rattle" in her chest.  She denies headache, ear pain, wheezing, difficulty breathing, chest pain, abdominal pain, nausea, vomiting, diarrhea, or rash.  Past Medical History:  Diagnosis Date   Chronic anxiety    Depression    DUB (dysfunctional uterine bleeding)    DVT of upper extremity (deep vein thrombosis) (HCC)    Associated with IV needle 2015   GERD (gastroesophageal reflux disease)    History of pulmonary embolus (PE) 2016   Solitary subsegmental pulmonary embolism to the right upper lobe 2015   Microcytic anemia    Parotid mass    Benign; status post resection by Dr. Donalee Fruits    Patient Active Problem List   Diagnosis Date Noted   Encounter for screening colonoscopy 03/04/2022   Rectal bleeding 09/16/2021   Dysphagia 09/16/2021   Constipation  09/16/2021   Cholecystitis 03/02/2016   Abnormal LFTs 06/27/2013   GERD (gastroesophageal reflux disease) 06/23/2013   Diastolic dysfunction 03/10/2013   Iron deficiency anemia due to chronic blood loss 03/09/2013   Acute pulmonary embolism (HCC) 03/08/2013   DVT of upper extremity (deep vein thrombosis) (HCC) 03/08/2013   Acute anxiety 03/08/2013    Past Surgical History:  Procedure Laterality Date   ABDOMINAL HYSTERECTOMY     ABLATION  12/2013   BIOPSY  03/28/2022   Procedure: BIOPSY;  Surgeon: Suzette Espy, MD;  Location: [facility];  Service: Endoscopy;;   CHOLECYSTECTOMY N/A 03/03/2016   Procedure: LAPAROSCOPIC CHOLECYSTECTOMY;  Surgeon: Alanda Allegra, MD;  Location: [facility];  Service: General;  Laterality: N/A;   COLONOSCOPY WITH PROPOFOL  N/A 03/28/2022   Procedure: COLONOSCOPY WITH PROPOFOL ;  Surgeon: Suzette Espy, MD;  Location: [facility];  Service: Endoscopy;  Laterality: N/A;  9:00 am. asa 3   ESOPHAGOGASTRODUODENOSCOPY (EGD) WITH PROPOFOL  N/A 03/28/2022   Procedure: ESOPHAGOGASTRODUODENOSCOPY (EGD) WITH PROPOFOL ;  Surgeon: Suzette Espy, MD;  Location: [facility];  Service: Endoscopy;  Laterality: N/A;   PAROTID GLAND TUMOR EXCISION     WISDOM TOOTH EXTRACTION      OB History   No obstetric history on file.      Home Medications    Prior to Admission medications   Medication Sig Start Date End  Date Taking? Authorizing Provider  aspirin EC 81 MG tablet Take 81 mg by mouth daily. Swallow whole.    [provider]  buPROPion  (WELLBUTRIN  XL) 300 MG 24 hr tablet Take 300 mg by mouth daily. 08/26/18   [provider]  fexofenadine (ALLEGRA) 60 MG tablet Take 60 mg by mouth daily.    [provider]  fluticasone  (FLONASE ) 50 MCG/ACT nasal spray Place 1 spray into both nostrils 2 (two) times daily. 04/29/23   Corbin Dess, PA-C  omeprazole  (PRILOSEC) 10 MG capsule Take 1 capsule (10 mg total) by mouth daily before  breakfast. Patient taking differently: Take 20 mg by mouth daily before breakfast. 09/16/21   Lanney Pitts, PA-C  promethazine -dextromethorphan (PROMETHAZINE -DM) 6.25-15 MG/5ML syrup Take 5 mLs by mouth 4 (four) times daily as needed. 04/29/23   Corbin Dess, PA-C  Sod Picosulfate-Mag Ox-Cit Acd (CLENPIQ ) 10-3.5-12 MG-GM -GM/175ML SOLN Take 1 kit by mouth as directed. 03/04/22   Rourk, Windsor Hatcher, MD    Family History Family History  Problem Relation Age of Onset   Diabetes Mother    Rheumatologic disease Mother    Hypertension Mother    Heart attack Mother    Heart failure Mother    Heart disease Father    Heart failure Father    Cancer - Colon Neg Hx    Inflammatory bowel disease Neg Hx     Social History Social History   Tobacco Use   Smoking status: Never   Smokeless tobacco: Never   Tobacco comments:    Never smoked  Substance Use Topics   Alcohol use: No   Drug use: No     Allergies   Amoxicillin    Review of Systems Review of Systems Per HPI  Physical Exam Triage Vital Signs ED Triage Vitals  Encounter Vitals Group     BP 05/02/23 1142 107/69     Systolic BP Percentile --      Diastolic BP Percentile --      Pulse Rate 05/02/23 1142 92     Resp 05/02/23 1142 18     Temp 05/02/23 1142 100.2 F (37.9 C)     Temp Source 05/02/23 1142 Oral     SpO2 05/02/23 1142 92 %     Weight --      Height --      Head Circumference --      Peak Flow --      Pain Score 05/02/23 1144 0     Pain Loc --      Pain Education --      Exclude from Growth Chart --    No data found.  Updated Vital Signs BP 107/69 (BP Location: Right Arm)   Pulse 92   Temp 100.2 F (37.9 C) (Oral)   Resp 18   LMP 01/22/2015   SpO2 92%   Visual Acuity Right Eye Distance:   Left Eye Distance:   Bilateral Distance:    Right Eye Near:   Left Eye Near:    Bilateral Near:     Physical Exam Vitals and nursing note reviewed.  Constitutional:      General: She is not in  acute distress.    Appearance: Normal appearance.  HENT:     Head: Normocephalic.     Right Ear: Tympanic membrane, ear canal and external ear normal.     Left Ear: Tympanic membrane, ear canal and external ear normal.     Nose: Congestion present.  Mouth/Throat:     Mouth: Mucous membranes are moist.  Eyes:     Extraocular Movements: Extraocular movements intact.     Pupils: Pupils are equal, round, and reactive to light.  Cardiovascular:     Rate and Rhythm: Normal rate and regular rhythm.     Pulses: Normal pulses.     Heart sounds: Normal heart sounds.  Pulmonary:     Effort: Pulmonary effort is normal. No respiratory distress.     Breath sounds: No stridor. Examination of the right-lower field reveals wheezing and rhonchi. Examination of the left-lower field reveals wheezing. Wheezing, rhonchi and rales present.  Abdominal:     General: Bowel sounds are normal.     Palpations: Abdomen is soft.     Tenderness: There is no abdominal tenderness.  Musculoskeletal:     Cervical back: Normal range of motion.  Skin:    General: Skin is warm and dry.  Neurological:     General: No focal deficit present.     Mental Status: She is alert and oriented to person, place, and time.  Psychiatric:        Mood and Affect: Mood normal.        Behavior: Behavior normal.      UC Treatments / Results  Labs (all labs ordered are listed, but only abnormal results are displayed) Labs Reviewed - No data to display  EKG   Radiology No results found.  Procedures Procedures (including critical care time)  Medications Ordered in UC Medications - No data to display  Initial Impression / Assessment and Plan / UC Course  I have reviewed the triage vital signs and the nursing notes.  Pertinent labs & imaging results that were available during my care of the patient were reviewed by me and considered in my medical decision making (see chart for details).  Chest x-ray was positive for  right lower lobe pneumonia.  Will treat with doxycycline 100 mg.  For bronchial inflammation and persistent cough, prednisone  40 mg was prescribed along with an albuterol  inhaler for shortness of breath or wheezing.  Patient advised to continue previous cough medication prescribed.  Supportive care recommendations were provided and discussed with the patient to include fluids, rest, over-the-counter analgesics, and use of a humidifier during sleep.  Discussed strict ER follow-up precautions with the patient.  Patient advised to follow-up with PCP for reevaluation within the next 10 to 14 days.  Patient was in agreement with this plan of care and verbalized understanding.  All questions were answered.  Patient stable for discharge.  Work note was provided.  Final Clinical Impressions(s) / UC Diagnoses   Final diagnoses:  Cough, unspecified type   Discharge Instructions   None    ED Prescriptions   None    PDMP not reviewed this encounter.   Hardy Lia, NP 05/02/23 1242

## 2023-05-10 ENCOUNTER — Ambulatory Visit: Payer: Self-pay

## 2023-05-13 ENCOUNTER — Ambulatory Visit
Admission: RE | Admit: 2023-05-13 | Discharge: 2023-05-13 | Disposition: A | Discharge status: Home / Self Care | Payer: Self-pay | Source: Ambulatory Visit | Attending: Internal Medicine | Admitting: Internal Medicine

## 2023-05-13 ENCOUNTER — Encounter (HOSPITAL_COMMUNITY): Payer: Self-pay | Admitting: Hematology and Oncology

## 2023-05-13 VITALS — BP 112/72 | HR 78 | Temp 98.5°F | Resp 18

## 2023-05-13 DIAGNOSIS — Z8701 Personal history of pneumonia (recurrent): Secondary | ICD-10-CM | POA: Diagnosis not present

## 2023-05-13 NOTE — ED Triage Notes (Signed)
 Here for recheck after having pneumonia recently.

## 2023-05-13 NOTE — ED Provider Notes (Signed)
 [facility]    CSN: [id number] Arrival date & time: 05/13/23  1653      History   Chief Complaint Chief Complaint  Patient presents with   Follow-up    HPI Victoria Stein is a 54 y.o. female.   Patient is here for re-evaluation after completing a course of doxycycline  for 10 days for right lower lobe pneumonia.  She has successfully completed the antibiotics and is feeling much better.  States she is without fever, chest pain, shortness of breath, cough, or other such symptoms.  Occasionally she feels her breath catch while she's talking but nothing else besides that.  Is not taking any OTC medications for the pneumonia at this time.  Overall she feels well - patient states she was informed to return in 10 days for a repeat chest x-ray.  The history is provided by the patient.    Past Medical History:  Diagnosis Date   Chronic anxiety    Depression    DUB (dysfunctional uterine bleeding)    DVT of upper extremity (deep vein thrombosis) (HCC)    Associated with IV needle 2015   GERD (gastroesophageal reflux disease)    History of pulmonary embolus (PE) 2016   Solitary subsegmental pulmonary embolism to the right upper lobe 2015   Microcytic anemia    Parotid mass    Benign; status post resection by Dr. Donalee Fruits    Patient Active Problem List   Diagnosis Date Noted   Encounter for screening colonoscopy 03/04/2022   Rectal bleeding 09/16/2021   Dysphagia 09/16/2021   Constipation 09/16/2021   Cholecystitis 03/02/2016   Abnormal LFTs 06/27/2013   GERD (gastroesophageal reflux disease) 06/23/2013   Diastolic dysfunction 03/10/2013   Iron deficiency anemia due to chronic blood loss 03/09/2013   Acute pulmonary embolism (HCC) 03/08/2013   DVT of upper extremity (deep vein thrombosis) (HCC) 03/08/2013   Acute anxiety 03/08/2013    Past Surgical History:  Procedure Laterality Date   ABDOMINAL HYSTERECTOMY     ABLATION  12/2013   BIOPSY  03/28/2022   Procedure:  BIOPSY;  Surgeon: Suzette Espy, MD;  Location: [facility];  Service: Endoscopy;;   CHOLECYSTECTOMY N/A 03/03/2016   Procedure: LAPAROSCOPIC CHOLECYSTECTOMY;  Surgeon: Alanda Allegra, MD;  Location: [facility];  Service: General;  Laterality: N/A;   COLONOSCOPY WITH PROPOFOL  N/A 03/28/2022   Procedure: COLONOSCOPY WITH PROPOFOL ;  Surgeon: Suzette Espy, MD;  Location: [facility];  Service: Endoscopy;  Laterality: N/A;  9:00 am. asa 3   ESOPHAGOGASTRODUODENOSCOPY (EGD) WITH PROPOFOL  N/A 03/28/2022   Procedure: ESOPHAGOGASTRODUODENOSCOPY (EGD) WITH PROPOFOL ;  Surgeon: Suzette Espy, MD;  Location: [facility];  Service: Endoscopy;  Laterality: N/A;   PAROTID GLAND TUMOR EXCISION     WISDOM TOOTH EXTRACTION      OB History   No obstetric history on file.      Home Medications    Prior to Admission medications   Medication Sig Start Date End Date Taking? Authorizing Provider  albuterol  (VENTOLIN  HFA) 108 (90 Base) MCG/ACT inhaler Inhale 2 puffs into the lungs every 6 (six) hours as needed. 05/02/23   Leath-Warren, Belen Bowers, NP  aspirin EC 81 MG tablet Take 81 mg by mouth daily. Swallow whole.    [provider]  buPROPion  (WELLBUTRIN  XL) 300 MG 24 hr tablet Take 300 mg by mouth daily. 08/26/18   [provider]  fexofenadine (ALLEGRA) 60 MG tablet Take 60 mg by mouth daily.  [provider]  omeprazole  (PRILOSEC) 10 MG capsule Take 1 capsule (10 mg total) by mouth daily before breakfast. Patient taking differently: Take 20 mg by mouth daily before breakfast. 09/16/21   Lanney Pitts, PA-C  Sod Picosulfate-Mag Ox-Cit Acd (CLENPIQ ) 10-3.5-12 MG-GM -GM/175ML SOLN Take 1 kit by mouth as directed. 03/04/22   Rourk, Windsor Hatcher, MD    Family History Family History  Problem Relation Age of Onset   Diabetes Mother    Rheumatologic disease Mother    Hypertension Mother    Heart attack Mother    Heart failure Mother    Heart disease Father    Heart failure  Father    Cancer - Colon Neg Hx    Inflammatory bowel disease Neg Hx     Social History Social History   Tobacco Use   Smoking status: Never   Smokeless tobacco: Never   Tobacco comments:    Never smoked  Substance Use Topics   Alcohol use: No   Drug use: No     Allergies   Amoxicillin    Review of Systems Review of Systems  Constitutional:  Negative for chills and fever.  HENT:  Negative for congestion, rhinorrhea and sore throat.   Eyes:  Negative for pain and redness.  Respiratory:  Negative for cough, chest tightness, shortness of breath and wheezing.   Cardiovascular:  Negative for chest pain and palpitations.  Gastrointestinal:  Negative for abdominal pain, diarrhea and vomiting.  Genitourinary:  Negative for dysuria and urgency.  Musculoskeletal:  Negative for arthralgias and myalgias.  Skin:  Negative for rash.  Neurological:  Negative for dizziness and headaches.     Physical Exam Triage Vital Signs ED Triage Vitals  Encounter Vitals Group     BP 05/13/23 1701 112/72     Systolic BP Percentile --      Diastolic BP Percentile --      Pulse Rate 05/13/23 1701 78     Resp 05/13/23 1701 18     Temp 05/13/23 1701 98.5 F (36.9 C)     Temp Source 05/13/23 1701 Oral     SpO2 05/13/23 1701 97 %     Weight --      Height --      Head Circumference --      Peak Flow --      Pain Score 05/13/23 1702 0     Pain Loc --      Pain Education --      Exclude from Growth Chart --    No data found.  Updated Vital Signs BP 112/72 (BP Location: Right Arm)   Pulse 78   Temp 98.5 F (36.9 C) (Oral)   Resp 18   LMP 01/22/2015   SpO2 97%   Physical Exam Vitals and nursing note reviewed.  Constitutional:      Appearance: Normal appearance.  HENT:     Head: Normocephalic.  Cardiovascular:     Rate and Rhythm: Normal rate and regular rhythm.     Heart sounds: Normal heart sounds.  Pulmonary:     Effort: Pulmonary effort is normal.     Breath sounds:  Normal breath sounds.  Abdominal:     General: Bowel sounds are normal.  Skin:    General: Skin is warm and dry.  Neurological:     General: No focal deficit present.     Mental Status: She is alert and oriented to person, place, and time.  Psychiatric:  Mood and Affect: Mood normal.        Behavior: Behavior normal.        Thought Content: Thought content normal.        Judgment: Judgment normal.    UC Treatments / Results  Labs (all labs ordered are listed, but only abnormal results are displayed) Labs Reviewed - No data to display  EKG   Radiology No results found.  Procedures Procedures (including critical care time)  Medications Ordered in UC Medications - No data to display  Initial Impression / Assessment and Plan / UC Course  I have reviewed the triage vital signs and the nursing notes.  Pertinent labs & imaging results that were available during my care of the patient were reviewed by me and considered in my medical decision making (see chart for details).     Patient presented for reevaluation after being diagnosed with right lower lobe pneumonia on 05/02/2023.  She completed her course of doxycycline  and states she is feeling much better.  No residual symptoms such as cough, chest discomfort, or shortness of breath.  She is not taking any OTC medications.  Patient was under the impression she was to have her chest x-ray repeated after 10 days.  However, I did clarify that she was just supposed to have reevaluation and that chest x-rays are not generally repeated until 4 to 6 weeks after completing treatment.  Therefore, reasonable for her to follow-up with her primary care provider in that recommended timeframe.  Final Clinical Impressions(s) / UC Diagnoses   Final diagnoses:  History of pneumonia     Discharge Instructions      Your physical examination is reassuring - continue to monitor your symptoms closely.  Recommend follow up with your primary  care provider in 3-4 weeks for re-evaluation and to assess the need for repeat chest x-ray.     ED Prescriptions   None    PDMP not reviewed this encounter.   Genene Kennel, FNP 05/13/23 [phone]

## 2023-05-13 NOTE — Discharge Instructions (Addendum)
 Your physical examination is reassuring - continue to monitor your symptoms closely.  Recommend follow up with your primary care provider in 3-4 weeks for re-evaluation and to assess the need for repeat chest x-ray.

## 2023-06-30 ENCOUNTER — Encounter (HOSPITAL_COMMUNITY): Payer: Self-pay | Admitting: Hematology and Oncology
